# Patient Record
Sex: Male | Born: 1950 | Race: White | Hispanic: No | Marital: Married | Smoking: Current every day smoker
Health system: Southern US, Community
[De-identification: ages and names within clinical notes are randomized; demographics above are authoritative.]

## PROBLEM LIST (undated history)

## (undated) DIAGNOSIS — F1021 Alcohol dependence, in remission: Secondary | ICD-10-CM

## (undated) DIAGNOSIS — F419 Anxiety disorder, unspecified: Secondary | ICD-10-CM

## (undated) DIAGNOSIS — I10 Essential (primary) hypertension: Secondary | ICD-10-CM

## (undated) DIAGNOSIS — K9189 Other postprocedural complications and disorders of digestive system: Secondary | ICD-10-CM

## (undated) DIAGNOSIS — T7840XA Allergy, unspecified, initial encounter: Secondary | ICD-10-CM

## (undated) DIAGNOSIS — F32A Depression, unspecified: Secondary | ICD-10-CM

## (undated) DIAGNOSIS — K567 Ileus, unspecified: Secondary | ICD-10-CM

## (undated) DIAGNOSIS — E785 Hyperlipidemia, unspecified: Secondary | ICD-10-CM

## (undated) DIAGNOSIS — F329 Major depressive disorder, single episode, unspecified: Secondary | ICD-10-CM

## (undated) DIAGNOSIS — Z8619 Personal history of other infectious and parasitic diseases: Secondary | ICD-10-CM

## (undated) HISTORY — PX: CHOLECYSTECTOMY: SHX55

## (undated) HISTORY — PX: TONSILLECTOMY: SUR1361

## (undated) HISTORY — PX: OTHER SURGICAL HISTORY: SHX169

## (undated) HISTORY — DX: Essential (primary) hypertension: I10

## (undated) HISTORY — PX: COLON SURGERY: SHX602

## (undated) HISTORY — DX: Allergy, unspecified, initial encounter: T78.40XA

## (undated) HISTORY — DX: Hyperlipidemia, unspecified: E78.5

---

## 1998-12-31 ENCOUNTER — Encounter: Admission: RE | Admit: 1998-12-31 | Discharge: 1999-03-31 | Payer: Self-pay | Admitting: Family Medicine

## 1999-12-14 ENCOUNTER — Encounter: Admission: RE | Admit: 1999-12-14 | Discharge: 1999-12-14 | Payer: Self-pay | Admitting: Family Medicine

## 1999-12-14 ENCOUNTER — Encounter: Payer: Self-pay | Admitting: Family Medicine

## 2000-05-22 HISTORY — PX: CARDIAC CATHETERIZATION: SHX172

## 2001-02-07 ENCOUNTER — Other Ambulatory Visit: Admission: RE | Admit: 2001-02-07 | Discharge: 2001-02-07 | Payer: Self-pay | Admitting: Internal Medicine

## 2001-02-07 ENCOUNTER — Encounter (INDEPENDENT_AMBULATORY_CARE_PROVIDER_SITE_OTHER): Payer: Self-pay | Admitting: Specialist

## 2001-04-01 ENCOUNTER — Observation Stay (HOSPITAL_COMMUNITY): Admission: EM | Admit: 2001-04-01 | Discharge: 2001-04-02 | Payer: Self-pay

## 2004-07-04 ENCOUNTER — Ambulatory Visit: Payer: Self-pay | Admitting: Family Medicine

## 2004-07-11 ENCOUNTER — Ambulatory Visit: Payer: Self-pay | Admitting: Family Medicine

## 2005-02-22 ENCOUNTER — Ambulatory Visit: Payer: Self-pay | Admitting: Family Medicine

## 2005-03-24 ENCOUNTER — Ambulatory Visit: Payer: Self-pay | Admitting: Family Medicine

## 2005-03-24 ENCOUNTER — Encounter: Admission: RE | Admit: 2005-03-24 | Discharge: 2005-03-24 | Payer: Self-pay | Admitting: Family Medicine

## 2005-03-30 ENCOUNTER — Ambulatory Visit: Payer: Self-pay | Admitting: Cardiology

## 2005-04-18 ENCOUNTER — Ambulatory Visit: Payer: Self-pay | Admitting: Family Medicine

## 2005-04-19 ENCOUNTER — Ambulatory Visit (HOSPITAL_COMMUNITY): Admission: RE | Admit: 2005-04-19 | Discharge: 2005-04-20 | Payer: Self-pay | Admitting: General Surgery

## 2005-04-19 ENCOUNTER — Encounter (INDEPENDENT_AMBULATORY_CARE_PROVIDER_SITE_OTHER): Payer: Self-pay | Admitting: Specialist

## 2005-04-24 ENCOUNTER — Inpatient Hospital Stay (HOSPITAL_COMMUNITY): Admission: EM | Admit: 2005-04-24 | Discharge: 2005-04-26 | Payer: Self-pay | Admitting: Emergency Medicine

## 2005-07-14 ENCOUNTER — Ambulatory Visit: Payer: Self-pay | Admitting: Family Medicine

## 2005-08-17 ENCOUNTER — Ambulatory Visit: Payer: Self-pay | Admitting: Family Medicine

## 2005-10-10 ENCOUNTER — Ambulatory Visit: Payer: Self-pay | Admitting: Family Medicine

## 2006-01-16 ENCOUNTER — Ambulatory Visit: Payer: Self-pay | Admitting: Family Medicine

## 2006-01-16 ENCOUNTER — Emergency Department (HOSPITAL_COMMUNITY): Admission: EM | Admit: 2006-01-16 | Discharge: 2006-01-16 | Payer: Self-pay | Admitting: Emergency Medicine

## 2006-01-29 ENCOUNTER — Ambulatory Visit: Payer: Self-pay | Admitting: Family Medicine

## 2006-10-17 ENCOUNTER — Ambulatory Visit: Payer: Self-pay | Admitting: Internal Medicine

## 2006-11-02 ENCOUNTER — Ambulatory Visit: Payer: Self-pay | Admitting: Internal Medicine

## 2006-11-02 ENCOUNTER — Encounter: Payer: Self-pay | Admitting: Internal Medicine

## 2007-02-13 DIAGNOSIS — F528 Other sexual dysfunction not due to a substance or known physiological condition: Secondary | ICD-10-CM | POA: Insufficient documentation

## 2007-02-13 DIAGNOSIS — J309 Allergic rhinitis, unspecified: Secondary | ICD-10-CM | POA: Insufficient documentation

## 2007-02-13 DIAGNOSIS — I10 Essential (primary) hypertension: Secondary | ICD-10-CM

## 2007-02-13 DIAGNOSIS — E785 Hyperlipidemia, unspecified: Secondary | ICD-10-CM

## 2007-04-03 ENCOUNTER — Encounter: Payer: Self-pay | Admitting: Family Medicine

## 2007-04-12 ENCOUNTER — Ambulatory Visit: Payer: Self-pay | Admitting: Family Medicine

## 2007-04-12 LAB — CONVERTED CEMR LAB
AST: 56 units/L — ABNORMAL HIGH (ref 0–37)
Bilirubin, Direct: 0.2 mg/dL (ref 0.0–0.3)
Blood in Urine, dipstick: NEGATIVE
Chloride: 100 meq/L (ref 96–112)
Creatinine, Ser: 0.9 mg/dL (ref 0.4–1.5)
Creatinine,U: 244.3 mg/dL
Eosinophils Relative: 2.3 % (ref 0.0–5.0)
Glucose, Bld: 269 mg/dL — ABNORMAL HIGH (ref 70–99)
HCT: 43.5 % (ref 39.0–52.0)
LDL Cholesterol: 78 mg/dL (ref 0–99)
MCV: 92.3 fL (ref 78.0–100.0)
Neutrophils Relative %: 49.5 % (ref 43.0–77.0)
RBC: 4.71 M/uL (ref 4.22–5.81)
RDW: 12.7 % (ref 11.5–14.6)
Sodium: 138 meq/L (ref 135–145)
Total Bilirubin: 1.2 mg/dL (ref 0.3–1.2)
Total CHOL/HDL Ratio: 3.5
WBC Urine, dipstick: NEGATIVE
WBC: 6.9 10*3/uL (ref 4.5–10.5)
pH: 5.5

## 2007-05-03 ENCOUNTER — Ambulatory Visit: Payer: Self-pay | Admitting: Family Medicine

## 2007-05-04 DIAGNOSIS — M5137 Other intervertebral disc degeneration, lumbosacral region: Secondary | ICD-10-CM | POA: Insufficient documentation

## 2007-05-10 ENCOUNTER — Ambulatory Visit: Payer: Self-pay | Admitting: Family Medicine

## 2007-06-17 ENCOUNTER — Ambulatory Visit: Payer: Self-pay | Admitting: Family Medicine

## 2007-08-13 ENCOUNTER — Ambulatory Visit: Payer: Self-pay | Admitting: Family Medicine

## 2007-08-17 ENCOUNTER — Encounter: Admission: RE | Admit: 2007-08-17 | Discharge: 2007-08-17 | Payer: Self-pay | Admitting: Family Medicine

## 2007-08-22 ENCOUNTER — Telehealth: Payer: Self-pay | Admitting: Family Medicine

## 2007-08-26 ENCOUNTER — Encounter: Admission: RE | Admit: 2007-08-26 | Discharge: 2007-08-26 | Payer: Self-pay | Admitting: Family Medicine

## 2007-09-09 ENCOUNTER — Encounter: Admission: RE | Admit: 2007-09-09 | Discharge: 2007-09-09 | Payer: Self-pay | Admitting: Family Medicine

## 2007-10-03 ENCOUNTER — Encounter: Admission: RE | Admit: 2007-10-03 | Discharge: 2007-10-03 | Payer: Self-pay | Admitting: Family Medicine

## 2007-10-21 ENCOUNTER — Ambulatory Visit: Payer: Self-pay | Admitting: Family Medicine

## 2007-10-21 DIAGNOSIS — L02619 Cutaneous abscess of unspecified foot: Secondary | ICD-10-CM | POA: Insufficient documentation

## 2007-10-21 DIAGNOSIS — L03119 Cellulitis of unspecified part of limb: Secondary | ICD-10-CM

## 2007-10-22 ENCOUNTER — Telehealth (INDEPENDENT_AMBULATORY_CARE_PROVIDER_SITE_OTHER): Payer: Self-pay | Admitting: *Deleted

## 2007-10-24 ENCOUNTER — Telehealth (INDEPENDENT_AMBULATORY_CARE_PROVIDER_SITE_OTHER): Payer: Self-pay | Admitting: *Deleted

## 2007-11-26 ENCOUNTER — Encounter: Admission: RE | Admit: 2007-11-26 | Discharge: 2008-01-02 | Payer: Self-pay | Admitting: Neurosurgery

## 2008-04-20 ENCOUNTER — Telehealth: Payer: Self-pay | Admitting: Family Medicine

## 2008-07-16 ENCOUNTER — Telehealth: Payer: Self-pay | Admitting: Family Medicine

## 2008-07-28 ENCOUNTER — Telehealth: Payer: Self-pay | Admitting: Family Medicine

## 2008-08-18 ENCOUNTER — Telehealth: Payer: Self-pay | Admitting: Family Medicine

## 2008-12-25 ENCOUNTER — Ambulatory Visit (HOSPITAL_COMMUNITY): Admission: RE | Admit: 2008-12-25 | Discharge: 2008-12-25 | Payer: Self-pay | Admitting: Podiatry

## 2009-04-27 ENCOUNTER — Telehealth: Payer: Self-pay | Admitting: Family Medicine

## 2009-05-31 ENCOUNTER — Ambulatory Visit: Payer: Self-pay | Admitting: Family Medicine

## 2009-06-03 LAB — CONVERTED CEMR LAB
ALT: 41 units/L (ref 0–53)
Albumin: 3.9 g/dL (ref 3.5–5.2)
BUN: 15 mg/dL (ref 6–23)
Basophils Absolute: 0 10*3/uL (ref 0.0–0.1)
Bilirubin, Direct: 0.3 mg/dL (ref 0.0–0.3)
Calcium: 9.6 mg/dL (ref 8.4–10.5)
Chloride: 104 meq/L (ref 96–112)
Cholesterol: 124 mg/dL (ref 0–200)
Creatinine, Ser: 0.8 mg/dL (ref 0.4–1.5)
Creatinine,U: 158 mg/dL
Eosinophils Absolute: 0.1 10*3/uL (ref 0.0–0.7)
Eosinophils Relative: 2.2 % (ref 0.0–5.0)
Hgb A1c MFr Bld: 10.1 % — ABNORMAL HIGH (ref 4.6–6.5)
LDL Cholesterol: 62 mg/dL (ref 0–99)
Lymphs Abs: 3 10*3/uL (ref 0.7–4.0)
MCV: 95 fL (ref 78.0–100.0)
Microalb Creat Ratio: 42.4 mg/g — ABNORMAL HIGH (ref 0.0–30.0)
Microalb, Ur: 6.7 mg/dL — ABNORMAL HIGH (ref 0.0–1.9)
Monocytes Absolute: 0.5 10*3/uL (ref 0.1–1.0)
Neutro Abs: 2.6 10*3/uL (ref 1.4–7.7)
Neutrophils Relative %: 41.9 % — ABNORMAL LOW (ref 43.0–77.0)
PSA: 0.17 ng/mL (ref 0.10–4.00)
Platelets: 172 10*3/uL (ref 150.0–400.0)
RBC: 4.9 M/uL (ref 4.22–5.81)
RDW: 12 % (ref 11.5–14.6)
Total Bilirubin: 1.6 mg/dL — ABNORMAL HIGH (ref 0.3–1.2)
Triglycerides: 110 mg/dL (ref 0.0–149.0)
VLDL: 22 mg/dL (ref 0.0–40.0)
WBC: 6.2 10*3/uL (ref 4.5–10.5)

## 2009-06-14 ENCOUNTER — Ambulatory Visit: Payer: Self-pay | Admitting: Family Medicine

## 2009-07-01 ENCOUNTER — Ambulatory Visit: Payer: Self-pay | Admitting: Family Medicine

## 2009-07-05 ENCOUNTER — Ambulatory Visit: Payer: Self-pay | Admitting: Family Medicine

## 2009-07-23 ENCOUNTER — Ambulatory Visit: Payer: Self-pay | Admitting: Family Medicine

## 2009-08-06 ENCOUNTER — Ambulatory Visit: Payer: Self-pay | Admitting: Family Medicine

## 2009-09-06 ENCOUNTER — Ambulatory Visit: Payer: Self-pay | Admitting: Family Medicine

## 2009-09-06 LAB — CONVERTED CEMR LAB
BUN: 8 mg/dL (ref 6–23)
Calcium: 9.7 mg/dL (ref 8.4–10.5)
Creatinine, Ser: 0.7 mg/dL (ref 0.4–1.5)
GFR calc non Af Amer: 122.34 mL/min (ref >60)
Hgb A1c MFr Bld: 6.4 % (ref 4.6–6.5)

## 2009-09-14 ENCOUNTER — Ambulatory Visit: Payer: Self-pay | Admitting: Family Medicine

## 2009-10-21 ENCOUNTER — Ambulatory Visit: Payer: Self-pay | Admitting: Family Medicine

## 2009-11-01 ENCOUNTER — Encounter: Admission: RE | Admit: 2009-11-01 | Discharge: 2010-01-30 | Payer: Self-pay | Admitting: Family Medicine

## 2009-11-11 ENCOUNTER — Ambulatory Visit: Payer: Self-pay | Admitting: Family Medicine

## 2009-12-07 ENCOUNTER — Ambulatory Visit: Payer: Self-pay | Admitting: Family Medicine

## 2009-12-07 DIAGNOSIS — B029 Zoster without complications: Secondary | ICD-10-CM | POA: Insufficient documentation

## 2009-12-17 ENCOUNTER — Telehealth: Payer: Self-pay | Admitting: Internal Medicine

## 2010-01-04 ENCOUNTER — Ambulatory Visit: Payer: Self-pay | Admitting: Family Medicine

## 2010-01-04 DIAGNOSIS — B0229 Other postherpetic nervous system involvement: Secondary | ICD-10-CM

## 2010-01-07 ENCOUNTER — Ambulatory Visit: Payer: Self-pay | Admitting: Family Medicine

## 2010-02-24 ENCOUNTER — Telehealth: Payer: Self-pay | Admitting: Family Medicine

## 2010-04-29 ENCOUNTER — Ambulatory Visit: Payer: Self-pay | Admitting: Family Medicine

## 2010-04-29 DIAGNOSIS — E1149 Type 2 diabetes mellitus with other diabetic neurological complication: Secondary | ICD-10-CM | POA: Insufficient documentation

## 2010-04-29 LAB — CONVERTED CEMR LAB
AST: 28 units/L (ref 0–37)
Albumin: 4.1 g/dL (ref 3.5–5.2)
Alkaline Phosphatase: 88 units/L (ref 39–117)
Basophils Absolute: 0.1 10*3/uL (ref 0.0–0.1)
Basophils Relative: 0.7 % (ref 0.0–3.0)
CO2: 28 meq/L (ref 19–32)
Creatinine,U: 62.2 mg/dL
Glucose, Bld: 118 mg/dL — ABNORMAL HIGH (ref 70–99)
HCT: 43.6 % (ref 39.0–52.0)
Hemoglobin: 15.1 g/dL (ref 13.0–17.0)
Lymphs Abs: 3.3 10*3/uL (ref 0.7–4.0)
MCHC: 34.7 g/dL (ref 30.0–36.0)
Monocytes Relative: 7.5 % (ref 3.0–12.0)
Neutro Abs: 3.9 10*3/uL (ref 1.4–7.7)
Potassium: 4.5 meq/L (ref 3.5–5.1)
RBC: 4.75 M/uL (ref 4.22–5.81)
RDW: 14.1 % (ref 11.5–14.6)
Sodium: 138 meq/L (ref 135–145)
TSH: 1.57 microintl units/mL (ref 0.35–5.50)
Total CHOL/HDL Ratio: 3
Total Protein: 7 g/dL (ref 6.0–8.3)

## 2010-06-08 ENCOUNTER — Telehealth: Payer: Self-pay | Admitting: Family Medicine

## 2010-06-21 ENCOUNTER — Other Ambulatory Visit (HOSPITAL_COMMUNITY): Payer: Self-pay | Admitting: Neurology

## 2010-06-21 DIAGNOSIS — IMO0002 Reserved for concepts with insufficient information to code with codable children: Secondary | ICD-10-CM

## 2010-06-23 ENCOUNTER — Other Ambulatory Visit: Payer: Self-pay | Admitting: Family Medicine

## 2010-06-23 DIAGNOSIS — N63 Unspecified lump in unspecified breast: Secondary | ICD-10-CM

## 2010-06-23 NOTE — Assessment & Plan Note (Signed)
Summary: 3 month follow up/cjr   Vital Signs:  Patient profile:   60 year [name] Temp:     98.5 degrees F oral BP sitting:   120 / 84  (left arm) Cuff size:   regular  Vitals Entered By: Jonathan Calhoun CMA Jonathan Calhoun) (September 14, 2009 9:14 AM)  CC: follow-up visit   CC:  follow-up visit.  History of Present Illness: Jonathan Calhoun, Jonathan Calhoun, Jonathan Calhoun, Jonathan Calhoun.  He is going to AA daily.  He's lost weight.  His blood sugar has stabilized in the 80s.  Also his blood pressure continues to be low despite cutting back his medication.  He found a third of a tablet of Hyzaar, and each having episodes of lightheadedness in the morning.  Systolic ranges from 10 5 to 124.  hemoglobin A1C less than 6.5.  Continue current diabetic treatment program  Allergies: No Known Drug Allergies  Past History:  Past medical, surgical, family and social histories (including risk factors) reviewed for relevance to current acute and chronic problems.  Past Medical History: Reviewed history from 02/13/2007 and no changes required. Allergic rhinitis Diabetes mellitus, type II Hyperlipidemia Calhoun erectile dysfunction  Past Surgical History: Reviewed history from 02/13/2007 and no changes required. cardiac cath  Oct 04, 2000 Cholecystectomy  10-04-2004  Family History: Reviewed history from 08/13/2007 and no changes required. father of 95, coronary disease  and outer diseasemother history of depression, colon cancer, died in 10-04-00 one brother in good health  Social History: Reviewed history from 05/03/2007 and no changes required. Occupation: [employer] Never Smoked Alcohol use-yes Drug use-no Regular exercise-yes  Review of Systems      See HPI  Physical Exam  General:  Well-developed,well-nourished,in no acute distress; alert,appropriate and cooperative throughout examination  Diabetes Management Exam:    Eye Exam:       Eye Exam done  elsewhere          Date: 07/20/2009          Results: normal          Done by: bevis   Impression & Recommendations:  Problem # 1:  Calhoun (ICD-401.9) Assessment Improved  The following medications were removed from the medication list:    Clonidine Hcl 0.1 Mg Tabs (Clonidine hcl) .[name] Kitchen... Take 1 tablet by mouth once a day    Atenolol 25 Mg Tabs (Atenolol) .[name] Kitchen... Take 1 tablet by mouth once a day His updated medication list for this problem includes:    Hyzaar 100-25 Mg Tabs (Losartan potassium-hctz) .[name] Kitchen... Take 1 tablet by mouth once a day  Complete Medication List: 1)  Hyzaar 100-25 Mg Tabs (Losartan potassium-hctz) .... Take 1 tablet by mouth once a day 2)  Lipitor 40 Mg Tabs (Atorvastatin calcium) .... Take 1 tablet by mouth at bedtime 3)  Aspirin 81 Mg Tbec (aspirin)  .... Once daily 4)  Viagra 50 Mg Tabs (Sildenafil citrate) .... As needed 5)  Onetouch Ultra Test Strp (Glucose blood) .... Use once daily to test glucose levels  dx 250.00 6)  Onetouch Delica Lancets Misc (Lancets) .... Use once daily to test glucose levels dx 250.00 7)  Metformin Hcl 500 Mg Tabs (Metformin hcl) .... Take one tab two times a day 8)  Glipizide 5 Mg Tabs (Glipizide) .... Take one tab by mouth two times a day  Patient Instructions: 1)  stop  the Hyzaar, completely.  Check a morning blood pressure daily.  Fax me the data in  4 weeks at [id number]. 2)  Schedule a CPX.  The third week in July 3)  BMP prior to visit, ICD-9:.........250.00 4)  Hepatic Panel prior to visit, ICD-9: 5)  Lipid Panel prior to visit, ICD-9: 6)  TSH prior to visit, ICD-9: 7)  CBC w/ Diff prior to visit, ICD-9: 8)  Urine-dip prior to visit, ICD-9: 9)  PSA prior to visit, ICD-9: 10)  HbgA1C prior to visit, ICD-9: 11)  Urine Microalbumin prior to visit, ICD-9:

## 2010-06-23 NOTE — Assessment & Plan Note (Signed)
Summary: ? SHINGLES//CCM   Vital Signs:  Patient profile:   60 year old male BP sitting:   130 / 84  (left arm) Cuff size:   regular CC: shingles   CC:  shingles.  History of Present Illness: Jonathan Calhoun is a 60 year old male, who comes in today for evaluation of probable shingles on his left chest.  He states for about two weeks.  He said some tingling and pain in that area.  Then, 3 days ago, he broke out in a rash.  He said shingles in the past for 5 years ago  Allergies: No Known Drug Allergies  Past History:  Past medical, surgical, family and social histories (including risk factors) reviewed for relevance to current acute and chronic problems.  Past Medical History: Reviewed history from 02/13/2007 and no changes required. Allergic rhinitis Diabetes mellitus, type II Hyperlipidemia Hypertension erectile dysfunction  Past Surgical History: Reviewed history from 02/13/2007 and no changes required. cardiac cath  2000/10/03 Cholecystectomy  10/03/04  Family History: Reviewed history from 08/13/2007 and no changes required. father of 14, coronary disease  and outer diseasemother history of depression, colon cancer, died in 10/03/00 one brother in good health  Social History: Reviewed history from 05/03/2007 and no changes required. Occupation: Married Never Smoked Alcohol use-yes Drug use-no Regular exercise-yes  Physical Exam  General:  Well-developed,well-nourished,in no acute distress; alert,appropriate and cooperative throughout examination Skin:   rash, consistent with shingles   Problems:  Medical Problems Added: 1)  Dx of Herpes Zoster  (ICD-053.9)  Impression & Recommendations:  Problem # 1:  HERPES ZOSTER (ICD-053.9) Assessment New  Complete Medication List: 1)  Hyzaar 100-25 Mg Tabs (Losartan potassium-hctz) .... Take 1 tablet by mouth once a day 2)  Lipitor 40 Mg Tabs (Atorvastatin calcium) .... Take 1 tablet by mouth at bedtime 3)  Aspirin 81 Mg Tbec  (aspirin)  .... Once daily 4)  Viagra 50 Mg Tabs (Sildenafil citrate) .... As needed 5)  Onetouch Ultra Test Strp (Glucose blood) .... Use once daily to test glucose levels  dx 250.00 6)  Onetouch Delica Lancets Misc (Lancets) .... Use once daily to test glucose levels dx 250.00 7)  Metformin Hcl 500 Mg Tabs (Metformin hcl) .... Take one tab two times a day 8)  Glipizide 5 Mg Tabs (Glipizide) .... Take one tab by mouth two times a day 9)  Vicodin Es 7.5-750 Mg Tabs (Hydrocodone-acetaminophen) .... 1/2 to 1 three times a day as needed 10)  Flexeril 10 Mg Tabs (Cyclobenzaprine hcl) .... Take 1 tablet by mouth three times a day 11)  Acyclovir 800 Mg Tabs (Acyclovir) .... Take 1 tablet by mouth three times a day  Patient Instructions: 1)  Zovirax 800 mg 3 times a day until rash clears Prescriptions: ACYCLOVIR 800 MG TABS (ACYCLOVIR) Take 1 tablet by mouth three times a day  #30 x 1   Entered and Authorized by:   Roderick Pee MD   Signed by:   Roderick Pee MD on 12/07/2009   Method used:   Print then Give to Patient   RxID:   [id number]

## 2010-06-23 NOTE — Assessment & Plan Note (Signed)
Summary: fup//ccm   Vital Signs:  Patient profile:   60 year old male BP sitting:   110 / 80  (left arm) Cuff size:   regular  Vitals Entered By: Kern Reap CMA Duncan Dull) (January 07, 2010 9:19 AM)  CC: follow-up visit   CC:  follow-up visit.  History of Present Illness: Jonathan Calhoun is 60-year-old male, who comes in today for follow-up of post shingles neuralgia.  He was taking gabapentin 200 mg t.i.d. with no improvement in his pain in fact, it got worse.  I saw him on August the 16th and started him on oxycodone 5 mg.  T.i.d.  I also decreased his gabapentin pain to 100 mg 3 times a day because he was having side effects from the gabapentin   He states his pain is gone from 10 down to a 5.  He states he is been able to sleep for the first time in 3 weeks.  He is still having pain but he feels much improved since he is able to sleep and has gotten some relief.  He does not want to take any more than 3 tablets a day.  He feels, like that's holding the pain at bay.  We also discussed consultation with Dr. Rosanne Ashing love.  I will call Harriett Sine and try to get him in to see jim  next week  Allergies: No Known Drug Allergies  Past History:  Past medical, surgical, family and social histories (including risk factors) reviewed, and no changes noted (except as noted below).  Past Medical History: Reviewed history from 02/13/2007 and no changes required. Allergic rhinitis Diabetes mellitus, type II Hyperlipidemia Hypertension erectile dysfunction  Past Surgical History: Reviewed history from 02/13/2007 and no changes required. cardiac cath  10-22-2000 Cholecystectomy  10-22-04  Family History: Reviewed history from 08/13/2007 and no changes required. father of 59, coronary disease  and outer diseasemother history of depression, colon cancer, died in Oct 22, 2000 one brother in good health  Social History: Reviewed history from 05/03/2007 and no changes required. Occupation: Married Never Smoked Alcohol  use-yes Drug use-no Regular exercise-yes  Review of Systems      See HPI  Physical Exam  General:  Well-developed,well-nourished,in no acute distress; alert,appropriate and cooperative throughout examination   Impression & Recommendations:  Problem # 1:  POSTHERPETIC NEURALGIA (ICD-053.19) Assessment Improved  Complete Medication List: 1)  Hyzaar 100-25 Mg Tabs (Losartan potassium-hctz) .... Take 1 tablet by mouth once a day 2)  Lipitor 40 Mg Tabs (Atorvastatin calcium) .... Take 1 tablet by mouth at bedtime 3)  Aspirin 81 Mg Tbec (aspirin)  .... Once daily 4)  Viagra 50 Mg Tabs (Sildenafil citrate) .... As needed 5)  Onetouch Ultra Test Strp (Glucose blood) .... Use once daily to test glucose levels  dx 250.00 6)  Onetouch Delica Lancets Misc (Lancets) .... Use once daily to test glucose levels dx 250.00 7)  Metformin Hcl 500 Mg Tabs (Metformin hcl) .... Take one tab two times a day 8)  Glipizide 5 Mg Tabs (Glipizide) .... Take one tab by mouth two times a day 9)  Vicodin Es 7.5-750 Mg Tabs (Hydrocodone-acetaminophen) .... 1/2 to 1 three times a day as needed 10)  Flexeril 10 Mg Tabs (Cyclobenzaprine hcl) .... Take 1 tablet by mouth three times a day 11)  Acyclovir 800 Mg Tabs (Acyclovir) .... Take 1 tablet by mouth three times a day 12)  Gabapentin 100 Mg Caps (Gabapentin) .[name] Kitchen.. 100 mg three times a day x one week, then 2  tid 13)  Oxycodone Hcl 5 Mg Tabs (Oxycodone hcl) .... Take 1 tablet by mouth three times a day  Patient Instructions: 1)  continue your current medications.  We will call to get her set up to see Dr. Rosanne Ashing love next week for consultation

## 2010-06-23 NOTE — Assessment & Plan Note (Signed)
Summary: fu on shingles/njr   Vital Signs:  Patient profile:   60 year old male BP sitting:   102 / 78  (left arm)  Vitals Entered By: Kern Reap CMA Duncan Dull) (January 04, 2010 1:59 PM) CC: follow-up visit   CC:  follow-up visit.  History of Present Illness: Jonathan Calhoun is a 60 year old male, who comes in today for treatment of post-shingles neuralgia.  He was seen a couple weeks ago with shingles and given Zovirax and Vicodin.  However, the rash went away, but the pains got worse.  He came back in my absence, and was started on gabapentin.  He is now taking 200 mg 3 times a day and is not helping.  He states his pain at night can be as high as a 15.  The Vicodin is not helping.  Dr. love sees [name] for trigeminal neuralgia and that we will try to get him a consult with Dr. love  Allergies: No Known Drug Allergies  Social History: Reviewed history from 05/03/2007 and no changes required. Occupation: Married Never Smoked Alcohol use-yes Drug use-no Regular exercise-yes  Review of Systems      See HPI  Physical Exam  General:  Well-developed,well-nourished,in no acute distress; alert,appropriate and cooperative throughout examination Skin:  Intact without suspicious lesions or rashes   Impression & Recommendations:  Problem # 1:  POSTHERPETIC NEURALGIA (ICD-053.19) Assessment New  Complete Medication List: 1)  Hyzaar 100-25 Mg Tabs (Losartan potassium-hctz) .... Take 1 tablet by mouth once a day 2)  Lipitor 40 Mg Tabs (Atorvastatin calcium) .... Take 1 tablet by mouth at bedtime 3)  Aspirin 81 Mg Tbec (aspirin)  .... Once daily 4)  Viagra 50 Mg Tabs (Sildenafil citrate) .... As needed 5)  Onetouch Ultra Test Strp (Glucose blood) .... Use once daily to test glucose levels  dx 250.00 6)  Onetouch Delica Lancets Misc (Lancets) .... Use once daily to test glucose levels dx 250.00 7)  Metformin Hcl 500 Mg Tabs (Metformin hcl) .... Take one tab two times a day 8)   Glipizide 5 Mg Tabs (Glipizide) .... Take one tab by mouth two times a day 9)  Vicodin Es 7.5-750 Mg Tabs (Hydrocodone-acetaminophen) .... 1/2 to 1 three times a day as needed 10)  Flexeril 10 Mg Tabs (Cyclobenzaprine hcl) .... Take 1 tablet by mouth three times a day 11)  Acyclovir 800 Mg Tabs (Acyclovir) .... Take 1 tablet by mouth three times a day 12)  Gabapentin 100 Mg Caps (Gabapentin) .[name] Kitchen.. 100 mg three times a day x one week, then 2 tid 13)  Oxycodone Hcl 5 Mg Tabs (Oxycodone hcl) .... Take 1 tablet by mouth three times a day  Patient Instructions: 1)  stop the acyclovir, and decreased the gabapentin wean to one tablet 3 times a day. 2)  Take oxycodone 5 mg 3 times a day. 3)  We will also put in a request for a consult with Dr. love. 4)  See me this Friday for follow-up Prescriptions: OXYCODONE HCL 5 MG TABS (OXYCODONE HCL) Take 1 tablet by mouth three times a day  #100 x 0   Entered and Authorized by:   Roderick Pee MD   Signed by:   Roderick Pee MD on 01/04/2010   Method used:   Print then Give to Patient   RxID:   [id number]

## 2010-06-23 NOTE — Assessment & Plan Note (Signed)
Summary: 2 wk rov/njr pt rsc/njr   Vital Signs:  Patient profile:   60 year old male BP sitting:   120 / 84  (left arm) Cuff size:   regular CC: follow-up visit   CC:  follow-up visit.  History of Present Illness: Jonathan Calhoun is a 60 year old male, nonsmoker, who comes in today for reevaluation of low back pain.  He is gradually getting better with time medication and most notably, physical therapy.  He states his pain is down to 4.  Again, no neurologic symptoms  Allergies: No Known Drug Allergies  Past History:  Past medical, surgical, family and social histories (including risk factors) reviewed for relevance to current acute and chronic problems.  Past Medical History: Reviewed history from 02/13/2007 and no changes required. Allergic rhinitis Diabetes mellitus, type II Hyperlipidemia Hypertension erectile dysfunction  Past Surgical History: Reviewed history from 02/13/2007 and no changes required. cardiac cath  Oct 09, 2000 Cholecystectomy  09-Oct-2004  Family History: Reviewed history from 08/13/2007 and no changes required. father of 68, coronary disease  and outer diseasemother history of depression, colon cancer, died in 10/09/00 one brother in good health  Social History: Reviewed history from 05/03/2007 and no changes required. Occupation: Married Never Smoked Alcohol use-yes Drug use-no Regular exercise-yes  Review of Systems      See HPI  Physical Exam  General:  Well-developed,well-nourished,in no acute distress; alert,appropriate and cooperative throughout examination Msk:  No deformity or scoliosis noted of thoracic or lumbar spine.   Pulses:  R and L carotid,radial,femoral,dorsalis pedis and posterior tibial pulses are full and equal bilaterally Extremities:  No clubbing, cyanosis, edema, or deformity noted with normal full range of motion of all joints.   Neurologic:  No cranial nerve deficits noted. Station and gait are normal. Plantar reflexes are down-going  bilaterally. DTRs are symmetrical throughout. Sensory, motor and coordinative functions appear intact.   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 Take 1 tablet by mouth once a day 2)  Lipitor 40 Mg Tabs (Atorvastatin calcium) .... Take 1 tablet by mouth at bedtime 3)  Aspirin 81 Mg Tbec (aspirin)  .... Once daily 4)  Viagra 50 Mg Tabs (Sildenafil citrate) .... As needed 5)  Onetouch Ultra Test Strp (Glucose blood) .... Use once daily to test glucose levels  dx 250.00 6)  Onetouch Delica Lancets Misc (Lancets) .... Use once daily to test glucose levels dx 250.00 7)  Metformin Hcl 500 Mg Tabs (Metformin hcl) .... Take one tab two times a day 8)  Glipizide 5 Mg Tabs (Glipizide) .... Take one tab by mouth two times a day 9)  Vicodin Es 7.5-750 Mg Tabs (Hydrocodone-acetaminophen) .... 1/2 to 1 three times a day as needed 10)  Flexeril 10 Mg Tabs (Cyclobenzaprine hcl) .... Take 1 tablet by mouth three times a day  Patient Instructions: 1)  continue your current treatment program.  Return p.r.n.

## 2010-06-23 NOTE — Assessment & Plan Note (Signed)
Summary: [facility] MED[facility]//CCM   Vital Signs:  Patient profile:   60 year old male Height:      73 inches Weight:      266 pounds BMI:     35.22 Temp:     97.8 degrees F oral BP sitting:   172 / 110  (left arm) Cuff size:   regular  Vitals Entered By: Kern Reap CMA Duncan Dull) (May 31, 2009 3:08 PM)  Reason for Visit med check  History of Present Illness: Jonathan Calhoun is a 60 year old, married male, nonsmoker now non-drinker x 6 weeks plus......... he had a friend, who was an alcoholic, who required hospitalization for his alcoholism.  After observing his friend he decided to  stop drinking completely drinking has never been a problem for and he  worked every, day.  He's not had a DUI, but he decided just to stop drinking.  He is going to [employer] and doing well.  Her weight is unchanged.  BP 172/110, however, he's been out of his Hyzaar for 4 days, plus.  blood sugar is running 140 fasting on Glucophage 500 b.i.d. and glipizide 10 mg b.i.d.  Would increase Glucophage to 1000 mg prior to breakfast.  Continue 500 mg prior to p.m. meal CPX ASAP since due.    Allergies: No Known Drug Allergies  Past History:  Past medical, surgical, family and social histories (including risk factors) reviewed for relevance to current acute and chronic problems.  Past Medical History: Reviewed history from 02/13/2007 and no changes required. Allergic rhinitis Diabetes mellitus, type II Hyperlipidemia Hypertension erectile dysfunction  Past Surgical History: Reviewed history from 02/13/2007 and no changes required. cardiac cath  10-14-00 Cholecystectomy  10/14/04  Family History: Reviewed history from 08/13/2007 and no changes required. father of 63, coronary disease  and outer diseasemother history of depression, colon cancer, died in 2000-10-14 one brother in good health  Social History: Reviewed history from 05/03/2007 and no changes required. Occupation: Married Never Smoked Alcohol use-yes Drug  use-no Regular exercise-yes  Review of Systems      See HPI  Physical Exam  General:  Well-developed,well-nourished,in no acute distress; alert,appropriate and cooperative throughout examination   Impression & Recommendations:  Problem # 1:  HYPERTENSION (ICD-401.9) Assessment Deteriorated  His updated medication list for this problem includes:    Hyzaar 100-25 Mg Tabs (Losartan potassium-hctz) .[name] Kitchen... Take 1 tablet by mouth once a day    Clonidine Hcl 0.1 Mg Tabs (Clonidine hcl) .[name] Kitchen... Take 1 tablet by mouth once a day    Atenolol 25 Mg Tabs (Atenolol) .[name] Kitchen... Take 1 tablet by mouth once a day  Orders: Prescription Created Electronically [phone])  Problem # 2:  DIABETES MELLITUS, TYPE II (ICD-250.00) Assessment: Deteriorated  The following medications were removed from the medication list:    Glucotrol 10 Mg Tabs (Glipizide) .[name] Kitchen... Take 1 tablet by mouth two times a day His updated medication list for this problem includes:    Hyzaar 100-25 Mg Tabs (Losartan potassium-hctz) .[name] Kitchen... Take 1 tablet by mouth once a day    Glipizide 10 Mg Tb24 (Glipizide) .[name] Kitchen... Take 1 tablet by mouth two times a day    Glucophage 500 Mg Tabs (Metformin hcl) .[name] Kitchen... 1 < pm meal    Glucophage 1000 Mg Tabs (Metformin hcl) .[name] Kitchen... 1 < am meal  Complete Medication List: 1)  Hyzaar 100-25 Mg Tabs (Losartan potassium-hctz) .... Take 1 tablet by mouth once a day 2)  Clonidine Hcl 0.1 Mg Tabs (Clonidine hcl) .... Take 1 tablet  by mouth once a day 3)  Glipizide 10 Mg Tb24 (Glipizide) .... Take 1 tablet by mouth two times a day 4)  Lipitor 40 Mg Tabs (Atorvastatin calcium) .... Take 1 tablet by mouth at bedtime 5)  Aspirin 81 Mg Tbec (aspirin)  .... Once daily 6)  Atenolol 25 Mg Tabs (Atenolol) .... Take 1 tablet by mouth once a day 7)  Viagra 50 Mg Tabs (Sildenafil citrate) .... As needed 8)  Glucophage 500 Mg Tabs (Metformin hcl) .[name] Kitchen.. 1 < pm meal 9)  Glucophage 1000 Mg Tabs (Metformin hcl) .[name] Kitchen.. 1 < am  meal  Patient Instructions: 1)  Please schedule a follow-up appointment in 2 weeks. 2)  It is important that you exercise regularly at least 20 minutes 5 times a week. If you develop chest pain, have severe difficulty breathing, or feel very tired , stop exercising immediately and seek medical attention. 3)  You need to lose weight. Consider a lower calorie diet and regular exercise.  4)  Take an Aspirin every day. 5)  Check your blood sugars regularly. If your readings are usually above : or below 70 you should contact our office. 6)  It is important that your Diabetic A1c level is checked every 3 months. 7)  See your eye doctor yearly to check for diabetic eye damage. 8)  Check your feet each night for sore areas, calluses or signs of infection. 9)  Check your Blood Pressure regularly. If it is above: you should make an appointment. 10)  BMP prior to visit, ICD-9:..................................250.00,401.9 11)  Hepatic Panel prior to visit, ICD-9: 12)  Lipid Panel prior to visit, ICD-9: 13)  TSH prior to visit, ICD-9: 14)  CBC w/ Diff prior to visit, ICD-9: 15)  Urine-dip prior to visit, ICD-9: 16)  PSA prior to visit, ICD-9: 17)  HbgA1C prior to visit, ICD-9: 18)  Urine Microalbumin prior to visit, ICD-9: Prescriptions: GLUCOPHAGE 1000 MG TABS (METFORMIN HCL) 1 < am meal  #100 x 3   Entered and Authorized by:   Roderick Pee MD   Signed by:   Roderick Pee MD on 05/31/2009   Method used:   Print then Give to Patient   RxID:   [id number] GLUCOPHAGE 500 MG  TABS (METFORMIN HCL) 1 < pm meal  #100 x 3   Entered and Authorized by:   Roderick Pee MD   Signed by:   Roderick Pee MD on 05/31/2009   Method used:   Print then Give to Patient   RxID:   [id number] VIAGRA 50 MG  TABS (SILDENAFIL CITRATE) as needed  #6 x 11   Entered and Authorized by:   Roderick Pee MD   Signed by:   Roderick Pee MD on 05/31/2009   Method used:   Print then Give to Patient   RxID:    [id number] ATENOLOL 25 MG  TABS (ATENOLOL) Take 1 tablet by mouth once a day  #100 x 3   Entered and Authorized by:   Roderick Pee MD   Signed by:   Roderick Pee MD on 05/31/2009   Method used:   Print then Give to Patient   RxID:   [id number] LIPITOR 40 MG  TABS (ATORVASTATIN CALCIUM) Take 1 tablet by mouth at bedtime  #100 x 3   Entered and Authorized by:   Roderick Pee MD   Signed by:   Roderick Pee MD on 05/31/2009   Method used:  Print then Give to Patient   RxID:   [id number] GLIPIZIDE 10 MG  TB24 (GLIPIZIDE) Take 1 tablet by mouth two times a day  #200 x 3   Entered and Authorized by:   Roderick Pee MD   Signed by:   Roderick Pee MD on 05/31/2009   Method used:   Print then Give to Patient   RxID:   [id number] CLONIDINE HCL 0.1 MG  TABS (CLONIDINE HCL) Take 1 tablet by mouth once a day  #100 x 3   Entered and Authorized by:   Roderick Pee MD   Signed by:   Roderick Pee MD on 05/31/2009   Method used:   Print then Give to Patient   RxID:   [id number] GLOVFI 100-25 MG  TABS (LOSARTAN POTASSIUM-HCTZ) Take 1 tablet by mouth once a day  #100 x 3   Entered and Authorized by:   Roderick Pee MD   Signed by:   Roderick Pee MD on 05/31/2009   Method used:   Print then Give to Patient   RxID:   [id number]   Appended Document: [facility]//CCM  Laboratory Results   Urine Tests    Routine Urinalysis   Color: yellow Appearance: Clear Glucose: 2+   (Normal Range: Negative) Bilirubin: negative   (Normal Range: Negative) Ketone: trace (5)   (Normal Range: Negative) Spec. Gravity: 1.025   (Normal Range: 1.003-1.035) Blood: negative   (Normal Range: Negative) pH: 5.5   (Normal Range: 5.0-8.0) Protein: 1+   (Normal Range: Negative) Urobilinogen: 1.0   (Normal Range: 0-1) Nitrite: negative   (Normal Range: Negative) Leukocyte Esterace: negative   (Normal Range: Negative)    Comments: Rita Ohara  May 31, 2009 3:55  PM

## 2010-06-23 NOTE — Progress Notes (Signed)
  Phone Note Call from Patient   Caller: Patient Call For: Roderick Pee MD Summary of Call: Gabapentin 100 mg 2 three times a day Needs refill [facility] [phone] Initial call taken by: [address] [facility] CMA AAMA,  June 08, 2010 12:11 PM  Follow-up for Phone Call        ok #100 ref x 3 Follow-up by: Roderick Pee MD,  June 08, 2010 12:16 PM    Prescriptions: NEURONTIN 100 MG CAPS (GABAPENTIN) as directed by your physician  #100 x 3   Entered by:   Lynann Beaver CMA AAMA   Authorized by:   Roderick Pee MD   Signed by:   Lynann Beaver CMA AAMA on 06/08/2010   Method used:   Telephoned to ...       Hospital doctor (retail)       [address]       [place], [place]  [address]       Ph: [phone] or [phone]       Fax: [phone]   RxID:   [id number]

## 2010-06-23 NOTE — Assessment & Plan Note (Signed)
Summary: ?bronchitis/njr   Vital Signs:  Patient profile:   60 year old male Weight:      242 pounds Temp:     99.3 degrees F oral BP sitting:   119 / 74  (left arm) Cuff size:   regular  Vitals Entered By: Kern Reap CMA Duncan Dull) (July 01, 2009 9:37 AM)  Reason for Visit head and chest congestion  History of Present Illness: sig is a 60 year old, married man nonsmoker, who comes in today for evaluation of a flulike illness, x 6 days.  Six days ago, developed fever and chills and cough.  He has no earache, sore throat, nausea, vomiting, or diarrhea.  Over the last couple days she developed a sensation of tightness in his chest and greenish sputum, but fever has gone away.  He is a nonsmoker.  No history of previous lung disease  Allergies: No Known Drug Allergies  Past History:  Past medical, surgical, family and social histories (including risk factors) reviewed for relevance to current acute and chronic problems.  Past Medical History: Reviewed history from 02/13/2007 and no changes required. Allergic rhinitis Diabetes mellitus, type II Hyperlipidemia Hypertension erectile dysfunction  Past Surgical History: Reviewed history from 02/13/2007 and no changes required. cardiac cath  2000-10-30 Cholecystectomy  30-Oct-2004  Family History: Reviewed history from 08/13/2007 and no changes required. father of 54, coronary disease  and outer diseasemother history of depression, colon cancer, died in 30-Oct-2000 one brother in good health  Social History: Reviewed history from 05/03/2007 and no changes required. Occupation: Married Never Smoked Alcohol use-yes Drug use-no Regular exercise-yes  Review of Systems      See HPI  Physical Exam  General:  Well-developed,well-nourished,in no acute distress; alert,appropriate and cooperative throughout examination Head:  Normocephalic and atraumatic without obvious abnormalities. No apparent alopecia or balding. Eyes:  No corneal or  conjunctival inflammation noted. EOMI. Perrla. Funduscopic exam benign, without hemorrhages, exudates or papilledema. Vision grossly normal. Ears:  External ear exam shows no significant lesions or deformities.  Otoscopic examination reveals clear canals, tympanic membranes are intact bilaterally without bulging, retraction, inflammation or discharge. Hearing is grossly normal bilaterally. Nose:  External nasal examination shows no deformity or inflammation. Nasal mucosa are pink and moist without lesions or exudates. Mouth:  Oral mucosa and oropharynx without lesions or exudates.  Teeth in good repair. Neck:  No deformities, masses, or tenderness noted. Chest Wall:  No deformities, masses, tenderness or gynecomastia noted. Lungs:  symmetrical breath sounds bilateral wheezing   Problems:  Medical Problems Added: 1)  Dx of Extrinsic Asthma, Unspecified  (ICD-493.00) 2)  Dx of Viral Infection-unspec  (ICD-079.99)  Impression & Recommendations:  Problem # 1:  VIRAL INFECTION-UNSPEC (ICD-079.99) Assessment New  His updated medication list for this problem includes:    Hydromet 5-1.5 Mg/26ml Syrp (Hydrocodone-homatropine) .[name] Kitchen... 1 or 2 tsps three times a day as needed  Problem # 2:  EXTRINSIC ASTHMA, UNSPECIFIED (ICD-493.00) Assessment: New  His updated medication list for this problem includes:    Prednisone 20 Mg Tabs (Prednisone) ..... Uad  Complete Medication List: 1)  Hyzaar 100-25 Mg Tabs (Losartan potassium-hctz) .... Take 1 tablet by mouth once a day 2)  Clonidine Hcl 0.1 Mg Tabs (Clonidine hcl) .... Take 1 tablet by mouth once a day 3)  Glipizide 10 Mg Tb24 (Glipizide) .... Take 1 tablet by mouth two times a day 4)  Lipitor 40 Mg Tabs (Atorvastatin calcium) .... Take 1 tablet by mouth at bedtime 5)  Aspirin 81 Mg  Tbec (aspirin)  .... Once daily 6)  Atenolol 25 Mg Tabs (Atenolol) .... Take 1 tablet by mouth once a day 7)  Viagra 50 Mg Tabs (Sildenafil citrate) .... As needed 8)   Glucophage 1000 Mg Tabs (Metformin hcl) .... Take one tab two times a day 9)  Onetouch Ultra Test Strp (Glucose blood) .... Use once daily to test glucose levels  dx 250.00 10)  One Touch Delica Lancets Misc (Lancets) .... Use once  daily to test glucose levels  dx 250.00 11)  Biaxin 500 Mg Tabs (Clarithromycin) .... Take 1 tablet by mouth two times a day 12)  Prednisone 20 Mg Tabs (Prednisone) .... Uad 13)  Hydromet 5-1.5 Mg/27ml Syrp (Hydrocodone-homatropine) .[name] Kitchen.. 1 or 2 tsps three times a day as needed  Patient Instructions: 1)  rest at home The rest of the week. 2)  Drinks 30 ounces of water daily, run  a vaporizer in her bedroom at night........[name] Kitchenbegin Biaxin 500 mg b.i.d., and prednisone two tabs daily return Monday for follow-up. 3)  You can also start Hydromet one to 2 teaspoons 3 times a day for cough 4)  check a fasting blood sugar daily.  Call me if it goes over 300 5)  hold the clonidine, and the cut the Hyzaar in half measure y  blood pressure every morning Prescriptions: HYDROMET 5-1.5 MG/5ML SYRP (HYDROCODONE-HOMATROPINE) 1 or 2 tsps three times a day as needed  #8oz x 1   Entered and Authorized by:   Roderick Pee MD   Signed by:   Roderick Pee MD on 07/01/2009   Method used:   Print then Give to Patient   RxID:   [id number] PREDNISONE 20 MG TABS (PREDNISONE) UAD  #30 x 1   Entered and Authorized by:   Roderick Pee MD   Signed by:   Roderick Pee MD on 07/01/2009   Method used:   Print then Give to Patient   RxID:   [id number] BIAXIN 500 MG TABS (CLARITHROMYCIN) Take 1 tablet by mouth two times a day  #20 x 1   Entered and Authorized by:   Roderick Pee MD   Signed by:   Roderick Pee MD on 07/01/2009   Method used:   Print then Give to Patient   RxID:   [id number]

## 2010-06-23 NOTE — Assessment & Plan Note (Signed)
Summary: fu bp/?low bp/njr   Vital Signs:  Patient profile:   60 year old male Weight:      249 pounds Temp:     97.6 degrees F oral BP sitting:   104 / 64  (left arm) Cuff size:   regular  Vitals Entered By: Kern Reap CMA Duncan Dull) (July 23, 2009 3:12 PM)  Reason for Visit follow up bp Is Patient Diabetic? Yes Did you bring your meter with you today? No CBG Result 109   History of Present Illness: Jonathan Calhoun is a 60 year old male, who comes in today because of weakness, and no energy.  His blood pressure is averaging 104.  Systolic diastolic has dropped to 64.  His random blood sugar today was 106.  These began a diet and exercise program along with no alcohol completely and this is probably why his blood sugar and blood pressure had dropped.  He is having symptoms of hypoglycemia around 11 a.m..  Allergies: No Known Drug Allergies  Past History:  Past medical, surgical, family and social histories (including risk factors) reviewed for relevance to current acute and chronic problems.  Past Medical History: Reviewed history from 02/13/2007 and no changes required. Allergic rhinitis Diabetes mellitus, type II Hyperlipidemia Hypertension erectile dysfunction  Past Surgical History: Reviewed history from 02/13/2007 and no changes required. cardiac cath  Oct 01, 2000 Cholecystectomy  10/01/04  Family History: Reviewed history from 08/13/2007 and no changes required. father of 69, coronary disease  and outer diseasemother history of depression, colon cancer, died in 10-01-00 one brother in good health  Social History: Reviewed history from 05/03/2007 and no changes required. Occupation: Married Never Smoked Alcohol use-yes Drug use-no Regular exercise-yes  Review of Systems      See HPI  Physical Exam  General:  Well-developed,well-nourished,in no acute distress; alert,appropriate and cooperative throughout examination   Impression & Recommendations:  Problem # 1:   HYPERTENSION (ICD-401.9) Assessment Deteriorated  His updated medication list for this problem includes:    Hyzaar 100-25 Mg Tabs (Losartan potassium-hctz) .[name] Kitchen... Take 1 tablet by mouth once a day    Clonidine Hcl 0.1 Mg Tabs (Clonidine hcl) .[name] Kitchen... Take 1 tablet by mouth once a day    Atenolol 25 Mg Tabs (Atenolol) .[name] Kitchen... Take 1 tablet by mouth once a day  Problem # 2:  DIABETES MELLITUS, TYPE II (ICD-250.00) Assessment: Improved  His updated medication list for this problem includes:    Hyzaar 100-25 Mg Tabs (Losartan potassium-hctz) .[name] Kitchen... Take 1 tablet by mouth once a day    Glipizide 10 Mg Tb24 (Glipizide) .[name] Kitchen... Take 1 tablet by mouth two times a day    Glucophage 1000 Mg Tabs (Metformin hcl) .[name] Kitchen... Take one tab two times a day  Orders: Capillary Blood Glucose/CBG ([address])  Complete Medication List: 1)  Hyzaar 100-25 Mg Tabs (Losartan potassium-hctz) .... Take 1 tablet by mouth once a day 2)  Clonidine Hcl 0.1 Mg Tabs (Clonidine hcl) .... Take 1 tablet by mouth once a day 3)  Glipizide 10 Mg Tb24 (Glipizide) .... Take 1 tablet by mouth two times a day 4)  Lipitor 40 Mg Tabs (Atorvastatin calcium) .... Take 1 tablet by mouth at bedtime 5)  Aspirin 81 Mg Tbec (aspirin)  .... Once daily 6)  Atenolol 25 Mg Tabs (Atenolol) .... Take 1 tablet by mouth once a day 7)  Viagra 50 Mg Tabs (Sildenafil citrate) .... As needed 8)  Glucophage 1000 Mg Tabs (Metformin hcl) .... Take one tab two times a day 9)  Onetouch Ultra Test Strp (Glucose blood) .... Use once daily to test glucose levels  dx 250.00 10)  Onetouch Delica Lancets Misc (Lancets) .... Use once daily to test glucose levels dx 250.00  Patient Instructions: 1)  hold the Hyzaar, clonidine, and atenolol until your blood pressure comes up above normal 140/90.  One should blood pressure rises to that level.  Restart the Hyzaar by taking one half tablet q.a.m. check a morning blood pressure daily. 2)  Decreased the glipizide to 10 mg dose one  half tablet twice a day and decrease the Glucophage 1000 mg dose one half tablet twice daily starting Sunday.  Check a blood sugar every morning.  Return in two weeks for follow-up

## 2010-06-23 NOTE — Assessment & Plan Note (Signed)
Summary: 2 WK ROV (30 MIN PER DR Kooper Godshall) // RS   Vital Signs:  Patient profile:   60 year old male Weight:      261 pounds Temp:     97.6 degrees F oral BP sitting:   120 / 90  (left arm) Cuff size:   regular  Vitals Entered By: Kern Reap CMA Duncan Dull) (June 14, 2009 4:30 PM)  Reason for Visit follow up labs   History of Present Illness:  Jonathan Calhoun  is a 60 year old, married male, nonsmokerwho comes in today for follow-up of diabetes.  His fasting blood sugar was over 200.  His A1c was 10.  He now is on a strict diet and exercise program and with increased his Glucotrol to 10 mg b.i.d. and Glucophage 1000 mg b.i.d.  His blood sugar in the past week has come down.  He has one that was 113.  his weight is down 5 pounds via diet and exercise  Allergies: No Known Drug Allergies  Past History:  Past medical, surgical, family and social histories (including risk factors) reviewed for relevance to current acute and chronic problems.  Past Medical History: Reviewed history from 02/13/2007 and no changes required. Allergic rhinitis Diabetes mellitus, type II Hyperlipidemia Hypertension erectile dysfunction  Past Surgical History: Reviewed history from 02/13/2007 and no changes required. cardiac cath  28-Oct-2000 Cholecystectomy  Oct 28, 2004  Family History: Reviewed history from 08/13/2007 and no changes required. father of 63, coronary disease  and outer diseasemother history of depression, colon cancer, died in 10/28/2000 one brother in good health  Social History: Reviewed history from 05/03/2007 and no changes required. Occupation: Married Never Smoked Alcohol use-yes Drug use-no Regular exercise-yes  Review of Systems      See HPI  Physical Exam  General:  Well-developed,well-nourished,in no acute distress; alert,appropriate and cooperative throughout examination   Impression & Recommendations:  Problem # 1:  DIABETES MELLITUS, TYPE II (ICD-250.00) Assessment Improved  The  following medications were removed from the medication list:    Glucophage 500 Mg Tabs (Metformin hcl) .[name] Kitchen... 1 < pm meal His updated medication list for this problem includes:    Hyzaar 100-25 Mg Tabs (Losartan potassium-hctz) .[name] Kitchen... Take 1 tablet by mouth once a day    Glipizide 10 Mg Tb24 (Glipizide) .[name] Kitchen... Take 1 tablet by mouth two times a day    Glucophage 1000 Mg Tabs (Metformin hcl) .[name] Kitchen... Take one tab two times a day  Orders: Prescription Created Electronically [id number])  Complete Medication List: 1)  Hyzaar 100-25 Mg Tabs (Losartan potassium-hctz) .... Take 1 tablet by mouth once a day 2)  Clonidine Hcl 0.1 Mg Tabs (Clonidine hcl) .... Take 1 tablet by mouth once a day 3)  Glipizide 10 Mg Tb24 (Glipizide) .... Take 1 tablet by mouth two times a day 4)  Lipitor 40 Mg Tabs (Atorvastatin calcium) .... Take 1 tablet by mouth at bedtime 5)  Aspirin 81 Mg Tbec (aspirin)  .... Once daily 6)  Atenolol 25 Mg Tabs (Atenolol) .... Take 1 tablet by mouth once a day 7)  Viagra 50 Mg Tabs (Sildenafil citrate) .... As needed 8)  Glucophage 1000 Mg Tabs (Metformin hcl) .... Take one tab two times a day 9)  Onetouch Ultra Test Strp (Glucose blood) .... Use once daily to test glucose levels  dx 250.00 10)  One Touch Delica Lancets Misc (Lancets) .... Use once  daily to test glucose levels  dx 250.00  Patient Instructions: 1)  See your eye doctor yearly to  check for diabetic eye damage. 2)  Please schedule a follow-up appointment in 3 months. 3)  BMP prior to visit, ICD-9:...........250.00 4)  HbgA1C prior to visit, ICD-9: Prescriptions: ONE TOUCH DELICA LANCETS  MISC (LANCETS) use once  daily to test glucose levels  dx 250.00  #100 x 3   Entered by:   Kern Reap CMA (AAMA)   Authorized by:   Roderick Pee MD   Signed by:   Kern Reap CMA (AAMA) on 06/14/2009   Method used:   Faxed to ...       Hospital doctor (retail)       [address]       [place], [place]  [address]       Ph: [phone] or [phone]       Fax: [phone]   RxID:   [id number] ONETOUCH ULTRA TEST  STRP (GLUCOSE BLOOD) use once daily to test glucose levels  dx 250.00  #100 x 3   Entered by:   Kern Reap CMA (AAMA)   Authorized by:   Roderick Pee MD   Signed by:   Kern Reap CMA (AAMA) on 06/14/2009   Method used:   Faxed to ...       Hospital doctor (retail)       [address]       [place], [place]  [address]       Ph: [phone] or [phone]       Fax: [phone]   RxID:   [id number]

## 2010-06-23 NOTE — Assessment & Plan Note (Signed)
Summary: 2 wk rov/njr   Vital Signs:  Patient profile:   60 year old male Weight:      240 pounds Temp:     98.4 degrees F oral BP sitting:   140 / 90  (left arm) Cuff size:   regular  Vitals Entered By: Kern Reap CMA Duncan Dull) (August 06, 2009 4:21 PM)  Reason for Visit follow up office visit  History of Present Illness: Sig. is a 60 year old, married male, nonsmoker, who comes in today for follow-up of hypertension and diabetes  We drastically cut his medication down.  See the note from two weeks ago because his blood pressure had dropped as his blood sugar.  Also dropped to, is now been alcohol free for a couple months.  Continues on a good diet and weight loss.  No lightheadedness when he stands up.  BP at home 110/70 fasting blood sugar 110  Allergies: No Known Drug Allergies  Past History:  Past medical, surgical, family and social histories (including risk factors) reviewed for relevance to current acute and chronic problems.  Past Medical History: Reviewed history from 02/13/2007 and no changes required. Allergic rhinitis Diabetes mellitus, type II Hyperlipidemia Hypertension erectile dysfunction  Past Surgical History: Reviewed history from 02/13/2007 and no changes required. cardiac cath  16-Oct-2000 Cholecystectomy  16-Oct-2004  Family History: Reviewed history from 08/13/2007 and no changes required. father of 9, coronary disease  and outer diseasemother history of depression, colon cancer, died in 10-16-00 one brother in good health  Social History: Reviewed history from 05/03/2007 and no changes required. Occupation: Married Never Smoked Alcohol use-yes Drug use-no Regular exercise-yes  Review of Systems      See HPI  Physical Exam  General:  Well-developed,well-nourished,in no acute distress; alert,appropriate and cooperative throughout examination   Impression & Recommendations:  Problem # 1:  HYPERTENSION (ICD-401.9) Assessment Improved  His  updated medication list for this problem includes:    Hyzaar 100-25 Mg Tabs (Losartan potassium-hctz) .[name] Kitchen... Take 1 tablet by mouth once a day    Clonidine Hcl 0.1 Mg Tabs (Clonidine hcl) .[name] Kitchen... Take 1 tablet by mouth once a day    Atenolol 25 Mg Tabs (Atenolol) .[name] Kitchen... Take 1 tablet by mouth once a day  Problem # 2:  DIABETES MELLITUS, TYPE II (ICD-250.00) Assessment: Improved  The following medications were removed from the medication list:    Glucophage 1000 Mg Tabs (Metformin hcl) .[name] Kitchen... Take one tab two times a day His updated medication list for this problem includes:    Hyzaar 100-25 Mg Tabs (Losartan potassium-hctz) .[name] Kitchen... Take 1 tablet by mouth once a day    Glipizide 10 Mg Tb24 (Glipizide) .[name] Kitchen... Take 1 tablet by mouth two times a day    Metformin Hcl 500 Mg Tabs (Metformin hcl) .[name] Kitchen... Take one tab two times a day  Complete Medication List: 1)  Hyzaar 100-25 Mg Tabs (Losartan potassium-hctz) .... Take 1 tablet by mouth once a day 2)  Clonidine Hcl 0.1 Mg Tabs (Clonidine hcl) .... Take 1 tablet by mouth once a day 3)  Glipizide 10 Mg Tb24 (Glipizide) .... Take 1 tablet by mouth two times a day 4)  Lipitor 40 Mg Tabs (Atorvastatin calcium) .... Take 1 tablet by mouth at bedtime 5)  Aspirin 81 Mg Tbec (aspirin)  .... Once daily 6)  Atenolol 25 Mg Tabs (Atenolol) .... Take 1 tablet by mouth once a day 7)  Viagra 50 Mg Tabs (Sildenafil citrate) .... As needed 8)  Onetouch Ultra Test Strp (  Glucose blood) .... Use once daily to test glucose levels  dx 250.00 9)  Onetouch Delica Lancets Misc (Lancets) .... Use once daily to test glucose levels dx 250.00 10)  Metformin Hcl 500 Mg Tabs (Metformin hcl) .... Take one tab two times a day  Patient Instructions: 1)  Please schedule a follow-up appointment in 3 months. 2)  BMP prior to visit, ICD-9: 3)  HbgA1C prior to visit, ICD-9:

## 2010-06-23 NOTE — Assessment & Plan Note (Signed)
Summary: SHINGLES CONCERNS // RS   Vital Signs:  Patient profile:   60 year old male Temp:     97.7 degrees F oral BP sitting:   120 / 84  (left arm) Cuff size:   regular  Vitals Entered By: Kern Reap CMA Duncan Dull) (April 29, 2010 11:01 AM)  Contraindications/Deferment of Procedures/Staging:    Test/Procedure: Weight Refused    Reason for deferment: patient declined-cannot calculate BMI  CC: shingles Is Patient Diabetic? Yes Did you bring your meter with you today? No   CC:  shingles.  History of Present Illness: Sig is a 20 r, who comes in today for evaluation of a sensation of hyperesthesia and tingling in his right lower extremity for the past two to 3 months.  He has had shingles twice in his life.  The first episode was in the right anterior chest wall.  That resolved after 4 weeks with no sequelae.  The second episode occurred last spring on the left chest wall.  However, this episode took a long time for the pain to go away.  We had him on gabapentin and oxycodone because of severe pain.  It finally completely  resolved.  He is off all those medications.  He's also had a history of lumbar disk disease.  Most recently for the past couple months.  He developed a sensation of tingling in his right lower extremity that comes and goes.  He denies any pain.  It states it keeps him awake at night when he turns it triggers it.  It began as noticed above.  He has had a history of lumbar disk disease in the past, although denies having no pain.  Neurologic review of systems otherwise negative.  His diabetes mellitus, managed with metformin 500  mg b.i.d., and glipizide 5 mg b.i.d.his last hemoglobin A1c was in the spring of 2011.......... 6.4%.  Allergies: No Known Drug Allergies  Past History:  Past medical, surgical, family and social histories (including risk factors) reviewed for relevance to current acute and chronic problems.  Past Medical History: Reviewed history from  02/13/2007 and no changes required. Allergic rhinitis Diabetes mellitus, type II Hyperlipidemia Hypertension erectile dysfunction  Past Surgical History: Reviewed history from 02/13/2007 and no changes required. cardiac cath  Oct 19, 2000 Cholecystectomy  10/19/04  Family History: Reviewed history from 08/13/2007 and no changes required. father of 54, coronary disease  and outer diseasemother history of depression, colon cancer, died in 10-19-00 one brother in good health  Social History: Reviewed history from 05/03/2007 and no changes required. Occupation: Married Never Smoked Alcohol use-yes Drug use-no Regular exercise-yes  Review of Systems      See HPI  Physical Exam  General:  Well-developed,well-nourished,in no acute distress; alert,appropriate and cooperative throughout examination Msk:  No deformity or scoliosis noted of thoracic or lumbar spine.   Pulses:  R and L carotid,radial,femoral,dorsalis pedis and posterior tibial pulses are full and equal bilaterally Extremities:  No clubbing, cyanosis, edema, or deformity noted with normal full range of motion of all joints.   Neurologic:  sensation and muscle strength are all normal.  Reflexes are diminished bilaterally.  No clonus.  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 Take 1 tablet  by mouth once a day    Metformin Hcl 1000 Mg Tabs (Metformin hcl) .[name] Kitchen... Take one tab by mouth two times a day His updated medication list for this problem includes:    Metformin Hcl 500 Mg Tabs (Metformin hcl) .[name] Kitchen... Take one tab two times a day    Glipizide 5 Mg Tabs (Glipizide) .[name] Kitchen... Take one tab by mouth two times a day    Lisinopril 5  Mg Tabs (Lisinopril) .[name] Kitchen... Take 1 tablet by mouth every morning  Orders: Neurology Referral (Neuro) Venipuncture ([address]) TLB-Lipid Panel (80061-LIPID) TLB-BMP (Basic Metabolic Panel-BMET) (80048-METABOL) TLB-CBC Platelet - w/Differential (85025-CBCD) TLB-Hepatic/Liver Function Pnl (80076-HEPATIC) TLB-TSH (Thyroid Stimulating Hormone) (84443-TSH) TLB-A1C / Hgb A1C (Glycohemoglobin) (83036-A1C) TLB-Microalbumin/Creat Ratio, Urine (82043-MALB) TLB-PSA (Prostate Specific Antigen) (84153-PSA) Specimen Handling ([address])  Complete Medication List: 1)  Lipitor 40 Mg Tabs (Atorvastatin calcium) .... Take 1 tablet by mouth at bedtime 2)  Aspirin 81 Mg Tbec (aspirin)  .... Once daily 3)  Viagra 50 Mg Tabs (Sildenafil citrate) .... As needed 4)  Onetouch Ultra Test Strp (Glucose blood) .... Use once daily to test glucose levels  dx 250.00 5)  Onetouch Delica Lancets Misc (Lancets) .... Use once daily to test glucose levels dx 250.00 6)  Metformin Hcl 500 Mg Tabs (Metformin hcl) .... Take one tab two times a day 7)  Glipizide 5 Mg Tabs (Glipizide) .... Take one tab by mouth two times a day 8)  Lisinopril 5 Mg Tabs (Lisinopril) .... Take 1 tablet by mouth every morning 9)  Vicodin Es 7.5-750 Mg Tabs (Hydrocodone-acetaminophen) .[name] Kitchen.. 1 tab @ bedtime  Patient Instructions: 1)  we will set up a consult with Dr. love. 2)  I will call you and I get your lab work back Prescriptions: VICODIN ES 7.5-750 MG TABS (HYDROCODONE-ACETAMINOPHEN) 1 tab @ bedtime  #30 x 2   Entered and Authorized by:   Roderick Pee MD   Signed by:   Roderick Pee MD on 04/29/2010   Method used:   Print then Give to Patient   RxID:   [id number] LISINOPRIL 5 MG TABS (LISINOPRIL) Take 1 tablet by mouth every morning  #100 x 3   Entered and Authorized by:   Roderick Pee MD   Signed by:   Roderick Pee MD on 04/29/2010   Method used:   Print then Give to Patient   RxID:   [id number]    Orders Added: 1)   Neurology Referral [Neuro] 2)  Venipuncture [41324] 3)  TLB-Lipid Panel [80061-LIPID] 4)  TLB-BMP (Basic Metabolic Panel-BMET) [80048-METABOL] 5)  TLB-CBC Platelet - w/Differential [85025-CBCD] 6)  TLB-Hepatic/Liver Function Pnl [80076-HEPATIC] 7)  TLB-TSH (Thyroid Stimulating Hormone) [84443-TSH] 8)  TLB-A1C / Hgb A1C (Glycohemoglobin) [83036-A1C] 9)  TLB-Microalbumin/Creat Ratio, Urine [82043-MALB] 10)  TLB-PSA (Prostate Specific Antigen) [84153-PSA] 11)  Est. Patient Level IV [40102] 12)  Specimen Handling [99000]

## 2010-06-23 NOTE — Assessment & Plan Note (Signed)
Summary: 4 day rov/njr   Vital Signs:  Patient profile:   60 year old male Weight:      248 pounds Temp:     98.6 degrees F oral BP sitting:   138 / 90  (left arm) Cuff size:   regular  Vitals Entered By: Kern Reap CMA Duncan Dull) (July 05, 2009 1:47 PM)  Reason for Visit follow up office visit  History of Present Illness: sig is a 60 year old, married male, nonsmoker, who comes in today for evaluation of asthma.  He was seen on February the 10th with severe wheezing.  He was started on prednisone, Biaxin, Hydromet for cough, and rest at home.  He also has underlying diabetes.  On 40 mg of prednisone daily.  His blood sugar rose to 170 to 180.  He other flies feels almost back to normal  Allergies: No Known Drug Allergies  Past History:  Past medical, surgical, family and social histories (including risk factors) reviewed, and no changes noted (except as noted below).  Past Medical History: Reviewed history from 02/13/2007 and no changes required. Allergic rhinitis Diabetes mellitus, type II Hyperlipidemia Hypertension erectile dysfunction  Past Surgical History: Reviewed history from 02/13/2007 and no changes required. cardiac cath  Oct 24, 2000 Cholecystectomy  Oct 24, 2004  Family History: Reviewed history from 08/13/2007 and no changes required. father of 79, coronary disease  and outer diseasemother history of depression, colon cancer, died in 2000/10/24 one brother in good health  Social History: Reviewed history from 05/03/2007 and no changes required. Occupation: Married Never Smoked Alcohol use-yes Drug use-no Regular exercise-yes  Review of Systems      See HPI  Physical Exam  General:  Well-developed,well-nourished,in no acute distress; alert,appropriate and cooperative throughout examination Neck:  No deformities, masses, or tenderness noted. Chest Wall:  No deformities, masses, tenderness or gynecomastia noted. Lungs:  Normal respiratory effort, chest expands  symmetrically. Lungs are clear to auscultation, no crackles or wheezes.   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 Uad  Complete Medication List: 1)  Hyzaar 100-25 Mg Tabs (Losartan potassium-hctz) .... Take 1 tablet by mouth once a day 2)  Clonidine Hcl 0.1 Mg Tabs (Clonidine hcl) .... Take 1 tablet by mouth once a day 3)  Glipizide 10 Mg Tb24 (Glipizide) .... Take 1 tablet by mouth two times a day 4)  Lipitor 40 Mg Tabs (Atorvastatin calcium) .... Take 1 tablet by mouth at bedtime 5)  Aspirin 81 Mg Tbec (aspirin)  .... Once daily 6)  Atenolol 25 Mg Tabs (Atenolol) .... Take 1 tablet by mouth once a day 7)  Viagra 50 Mg Tabs (Sildenafil citrate) .... As needed 8)  Glucophage 1000 Mg Tabs (Metformin hcl) .... Take one tab two times a day 9)  Onetouch Ultra Test Strp (Glucose blood) .... Use once daily to test glucose levels  dx 250.00 10)  One Touch Delica Lancets Misc (Lancets) .... Use once  daily to test glucose levels  dx 250.00 11)  Biaxin 500 Mg Tabs (Clarithromycin) .... Take 1 tablet by mouth two times a day 12)  Prednisone 20 Mg Tabs (Prednisone) .... Uad 13)  Hydromet 5-1.5 Mg/47ml Syrp (Hydrocodone-homatropine) .[name] Kitchen.. 1 or 2 tsps three times a day as needed  Patient Instructions: 1)  finished the antibiotic.  Taper the prednisone, one tablet daily, x 3 days, a half x 3 days, then  stop. 2)  Return p.r.n.

## 2010-06-23 NOTE — Assessment & Plan Note (Signed)
Summary: recurring lower back pain/cjr   Vital Signs:  Patient profile:   60 year old male Temp:     98.6 degrees F oral BP sitting:   142 / 92  (left arm) Cuff size:   regular  Vitals Entered By: Kern Reap CMA Duncan Dull) (October 21, 2009 4:19 PM) CC: lower back pain   CC:  lower back pain.  History of Present Illness: sig is a 60 year old male, who comes in today for evaluation of low back pain.  His pain is gone, worse over the past two to 3 months.  Previously was in his right lower back, now it's in his left.  The pain in his right lower back, radiates down to his right knee, but no further pain.  He denies any neurologic symptoms.  No history of trauma.  Previous MRI to 3 years ago, showed a lot of degenerative joint changes.  Neurologic consultation by Dr. Venetia Maxon recommended physical therapy, which he did in the pain improved a lot, but now it's worst pain.  His pain now on a scale of one to 10  between a 3 and a 10.  Allergies: No Known Drug Allergies  Social History: Reviewed history from 05/03/2007 and no changes required. Occupation: Married Never Smoked Alcohol use-yes Drug use-no Regular exercise-yes  Review of Systems      See HPI  Physical Exam  General:  Well-developed,well-nourished,in no acute distress; alert,appropriate and cooperative throughout examination Msk:  No deformity or scoliosis noted of thoracic or lumbar spine.   Pulses:  R and L carotid,radial,femoral,dorsalis pedis and posterior tibial pulses are full and equal bilaterally Extremities:  No clubbing, cyanosis, edema, or deformity noted with normal full range of motion of all joints.   Neurologic:  No cranial nerve deficits noted. Station and gait are normal. Plantar reflexes are down-going bilaterally. DTRs are symmetrical throughout. Sensory, motor and coordinative functions appear intact.   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 Take 1 tablet by mouth once a day 2)  Lipitor 40 Mg Tabs (Atorvastatin calcium) .... Take 1 tablet by mouth at bedtime 3)  Aspirin 81 Mg Tbec (aspirin)  .... Once daily 4)  Viagra 50 Mg Tabs (Sildenafil citrate) .... As needed 5)  Onetouch Ultra Test Strp (Glucose blood) .... Use once daily to test glucose levels  dx 250.00 6)  Onetouch Delica Lancets Misc (Lancets) .... Use once daily to test glucose levels dx 250.00 7)  Metformin Hcl 500 Mg Tabs (Metformin hcl) .... Take one tab two times a day 8)  Glipizide 5 Mg Tabs (Glipizide) .... Take one tab by mouth two times a day 9)  Vicodin Es 7.5-750 Mg Tabs (Hydrocodone-acetaminophen) .... 1/2 to 1 three times a day as needed 10)  Flexeril 10 Mg Tabs (Cyclobenzaprine hcl) .... Take 1 tablet by mouth three times a day  Patient Instructions: 1)  stay at bed rest today, Friday, Saturday........., Sunday, walk lie down, walk lie down.  Flexeril, and Vicodin, one half of each 3 to 4 times a day, why you're at bed rest.  Starting Sunday just take a half of Vicodin and/or Flexeril at bedtime.  We will also do to set up for PT ASAP. 2)  Take Motrin 600 mg 3 times a day with food as a baseline pain medication and anti-inflammatory return in two  weeks for follow-up Prescriptions: FLEXERIL 10 MG TABS (CYCLOBENZAPRINE HCL) Take 1 tablet by mouth three times a day  #50 x 0   Entered and Authorized by:   Roderick Pee MD   Signed by:   Roderick Pee MD on 10/21/2009   Method used:   Print then Give to Patient   RxID:   [id number] VICODIN ES 7.5-750 MG TABS (HYDROCODONE-ACETAMINOPHEN) 1/2 to 1 three times a day as needed  #50 x 0   Entered and Authorized by:   Roderick Pee MD   Signed by:   Roderick Pee MD on 10/21/2009   Method used:   Print then Give to Patient   RxID:   [id number]

## 2010-06-23 NOTE — Progress Notes (Signed)
Summary: shingles  Phone Note Call from Patient   Caller: Patient Call For: Roderick Pee MD Summary of Call: Pt has shingles, and out of Vicodin, and on the 10th  day of Acyclovir.  Is asking for guidance as pain is still severe.  [facility] [phone] Initial call taken by: Lynann Beaver CMA,  December 17, 2009 8:51 AM  Follow-up for Phone Call        gabapenin  in100 mg  #100 one three times a day for one week then 2 three times a day; OK to take with pain meds (vicodin)   Follow-up by: Gordy Savers  MD,  December 17, 2009 12:51 PM    New/Updated Medications: GABAPENTIN 100 MG CAPS (GABAPENTIN) 100 mg three times a day x one week, then 2 tid Prescriptions: GABAPENTIN 100 MG CAPS (GABAPENTIN) 100 mg three times a day x one week, then 2 tid  #100 x 0   Entered by:   Lynann Beaver CMA   Authorized by:   Gordy Savers  MD   Signed by:   Lynann Beaver CMA on 12/17/2009   Method used:   Faxed to ...       [facility]ail[facility]       [address]       [place], [place]  [address]       Ph: [phone] or [phone]       Fax: [phone]   RxID:   [id number]  Pt notified.

## 2010-06-23 NOTE — Progress Notes (Signed)
Addended by: Kern Reap on: 06/23/2010 02:00 PM   Modules accepted: Orders

## 2010-06-23 NOTE — Progress Notes (Signed)
Summary: change with metformin  Phone Note Refill Request Message from:  Fax from Pharmacy on February 24, 2010 8:58 AM  Refills Requested: Medication #1:  METFORMIN HCL 500 MG TABS take one tab two times a day  Follow-up for Phone Call        patient has been taking 1 tab two times a day for a month. is this okay to fill? Follow-up by: Kern Reap CMA Eisley Barber Dull),  February 24, 2010 8:59 AM  Additional Follow-up for Phone Call Additional follow up Details #1::        dispensed 200 tabs directions one p.o. b.i.d. before meals, refills x 2 Additional Follow-up by: Roderick Pee MD,  February 24, 2010 9:04 AM    New/Updated Medications: METFORMIN HCL 1000 MG TABS (METFORMIN HCL) take one tab by mouth two times a day Prescriptions: METFORMIN HCL 1000 MG TABS (METFORMIN HCL) take one tab by mouth two times a day  #200 x 2   Entered by:   Kern Reap CMA (AAMA)   Authorized by:   Roderick Pee MD   Signed by:   Kern Reap CMA (AAMA) on 02/24/2010   Method used:   Faxed to ...       Hospital doctor (retail)       [address]       [place], [place]  [address]       Ph: [phone] or [phone]       Fax: [phone]   RxID:   [id number]

## 2010-06-25 ENCOUNTER — Encounter (HOSPITAL_COMMUNITY): Payer: Self-pay

## 2010-06-25 ENCOUNTER — Ambulatory Visit (HOSPITAL_COMMUNITY)
Admission: RE | Admit: 2010-06-25 | Discharge: 2010-06-25 | Disposition: A | Discharge status: Home / Self Care | Payer: 59 | Source: Ambulatory Visit | Attending: Neurology | Admitting: Neurology

## 2010-06-25 DIAGNOSIS — M5137 Other intervertebral disc degeneration, lumbosacral region: Secondary | ICD-10-CM | POA: Insufficient documentation

## 2010-06-25 DIAGNOSIS — M545 Low back pain, unspecified: Secondary | ICD-10-CM | POA: Insufficient documentation

## 2010-06-25 DIAGNOSIS — M25559 Pain in unspecified hip: Secondary | ICD-10-CM | POA: Insufficient documentation

## 2010-06-25 DIAGNOSIS — IMO0002 Reserved for concepts with insufficient information to code with codable children: Secondary | ICD-10-CM | POA: Insufficient documentation

## 2010-06-25 DIAGNOSIS — M51379 Other intervertebral disc degeneration, lumbosacral region without mention of lumbar back pain or lower extremity pain: Secondary | ICD-10-CM | POA: Insufficient documentation

## 2010-06-25 DIAGNOSIS — M519 Unspecified thoracic, thoracolumbar and lumbosacral intervertebral disc disorder: Secondary | ICD-10-CM | POA: Insufficient documentation

## 2010-06-27 ENCOUNTER — Other Ambulatory Visit: Payer: Self-pay | Admitting: Family Medicine

## 2010-06-27 NOTE — Telephone Encounter (Signed)
Jonathan Calhoun. To renew medication until next physical exam

## 2010-06-27 NOTE — Telephone Encounter (Signed)
Time for a yearly physical

## 2010-07-23 ENCOUNTER — Other Ambulatory Visit: Payer: Self-pay | Admitting: Neurology

## 2010-07-23 ENCOUNTER — Ambulatory Visit (HOSPITAL_COMMUNITY)
Admission: RE | Admit: 2010-07-23 | Discharge: 2010-07-23 | Disposition: A | Discharge status: Home / Self Care | Payer: 59 | Source: Ambulatory Visit | Attending: Neurology | Admitting: Neurology

## 2010-07-23 ENCOUNTER — Other Ambulatory Visit (HOSPITAL_COMMUNITY): Payer: Self-pay | Admitting: Neurology

## 2010-07-23 DIAGNOSIS — M25559 Pain in unspecified hip: Secondary | ICD-10-CM | POA: Insufficient documentation

## 2010-07-23 DIAGNOSIS — M1610 Unilateral primary osteoarthritis, unspecified hip: Secondary | ICD-10-CM | POA: Insufficient documentation

## 2010-07-23 DIAGNOSIS — M169 Osteoarthritis of hip, unspecified: Secondary | ICD-10-CM | POA: Insufficient documentation
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
## 2010-10-07 NOTE — H&P (Signed)
NAMEPRYCE, FOLTS NO.:  [id number]   MEDICAL RECORD NO.:  [id number]          PATIENT TYPE:  EMS   LOCATION:  MAJO                         FACILITY:  [facility]   PHYSICIAN:  Revonda Standard L. Rennis Harding, N.P. DATE OF BIRTH:  [birth year]   DATE OF ADMISSION:  01/16/2006  DATE OF DISCHARGE:                                HISTORY & PHYSICAL   PRIMARY CARE PHYSICIAN:  Tinnie Gens A. Tawanna Cooler, M.D.   CHIEF COMPLAINT:  Abdominal pain rule out appendicitis.   HISTORY OF PRESENT ILLNESS:  This is a 60 year old male patient with a  history of diabetes and hypertension and obesity who relates acute onset of  abdominal pain in the mid portion yesterday sometime around lunch, late  afternoon.  The patient initially felt the symptoms were indigestion, tried  to burp without any relief.  Later, the pain intensified and was more  localized to the right middle quadrant with radiation up into the right  shoulder.  He was having associated nausea but no emesis.  His last bowel  movement was at noon yesterday, after lunch.  The patient went to his  primary care physician and because of the location of the pain, there was  some concern the patient might have acute appendicitis.  He was sent to the  ER for further evaluation and surgical admission.  Since arrival, he has had  some laboratory values checked.  His white count is 7,200 with a neutrophil  count of 58%.  LFTs are mildly elevated with a total bilirubin of 1.7.  The  patient is on a Statin drug.  His amylase and lipase are normal.  CT of the  abdomen and pelvis is pending.   REVIEW OF SYSTEMS:  As above, otherwise negative.   PAST MEDICAL HISTORY:  1. Diabetes.  2. Hypertension.   PAST SURGICAL HISTORY:  Cholecystectomy in November 2006.   SOCIAL HISTORY:  Remote tobacco.  Averages 2 beers a day, more on the  weekends but less than a 6-pack.  He is married.  He works in  sales/distribution for a Magazine features editor.   FAMILY  HISTORY:  Positive for hypertension and CAD.   ALLERGIES:  NKDA.   CURRENT MEDICATIONS:  1. Aspirin 325 mg daily.  2. Atenolol 25 mg daily.  3. Clonidine 0.1 mg daily.  4. Glipizide 10 mg daily.  5. Hyzaar 10/25 daily.  6. Lipitor 40 mg daily.   PHYSICAL EXAMINATION:  GENERAL:  A pleasant male patient complaining of  right middle and right lower quadrant abdominal pain of less than 24 hours'  duration.  VITAL SIGNS:  Temp 97.7, blood pressure 133/88, pulse 76, respirations 20.  NEURO:  The patient is alert and oriented x3, moving all extremities x4, no  focal deficits.  HEENT:  Head normocephalic.  NECK:  Full and obese without appreciable adenopathy.  CHEST:  Bilateral lung sounds are clear to auscultation.  Respiratory effort  is non-labored.  CARDIAC:  S1 S2.  No rubs, murmurs, thrills, no gallops.  ABDOMEN:  Obese but soft.  No significant abdominal scarring noted.  Bowel  sounds  are present.  Slightly distended.  He is tender in the right middle  quadrant laterally without guarding or rebounding.  No tenderness over  McBurney's point.  EXTREMITIES:  Symmetrical in appearance without edema, cyanosis, or  clubbing.  Pulses are palpable.   LABORATORY:  White count 7,200, hemoglobin 14.5, platelets 153,000,  neutrophils 58%.  PT 14.2, INR 1.1, PTT 31.  Amylase 55, lipase 38.  Sodium  134, potassium 4.0, CO2 25, glucose 307, BUN 9, creatinine 0.8.  LFTs:  AST  57, __________  52, and total bilirubin is 1.7.   DIAGNOSTIC STUDIES:  CT of the abdomen and pelvis is pending.  Urinalysis is  also pending.  EKG shows no acute process, sinus rhythm.   IMPRESSION:  1. Right abdominal pain.  Differential includes A.  Appendicitis.  B.      Typhlitis.  C.  Mesenteric abscess, doubt white count is normal.  D.      Atypical presentation of diverticular disease with possible      perforation.  E.  Inflammatory bowel disease.  2. Diabetes, currently uncontrolled with hyperglycemia.  3.  Transaminitis, probably secondary to ongoing Statin use.   PLAN:  1. Follow up on CT of abdominal and pelvis by Dr. Janee Morn.  This will      determine whether surgical intervention is urgent or can be delayed.  2. Continue NPO status, IV fluids.  3. The patient had Demerol ordered per primary care physician.  Due to      side effects and problems related with Demerol, we will change this to      Dilaudid.  Continue Phenergan for nausea.  4. Follow up on chest x-ray.  5. Hold his aspirin in case surgical intervention or other procedures      indicated.   ADDITIONAL RECOMMENDATIONS:  Per Dr. Janee Morn.      Allison L. Rennis Harding, N.P.     ALE/MEDQ  D:  01/16/2006  T:  01/16/2006  Job:  [id number]   cc:   Tinnie Gens A. Tawanna Cooler, MD

## 2010-10-07 NOTE — Op Note (Signed)
NAMELESTON, SCHUELLER               ACCOUNT NO.:  [id number]   MEDICAL RECORD NO.:  [id number]          PATIENT TYPE:  AMB   LOCATION:  SDS                          FACILITY:  [facility]   PHYSICIAN:  Sharlet Salina T. Hoxworth, M.D.DATE OF BIRTH:  07-03-1950   DATE OF PROCEDURE:  04/19/2005  DATE OF DISCHARGE:                                 OPERATIVE REPORT   PREOPERATIVE DIAGNOSIS:  Cholelithiasis and chronic cholecystitis.   POSTOPERATIVE DIAGNOSIS:  Cholelithiasis and chronic cholecystitis.   SURGICAL PROCEDURE:  Laparoscopic cholecystectomy with intraoperative  cholangiogram.   SURGEON:  Sharlet Salina T. Hoxworth, M.D.   ASSISTANT:  Danna Hefty, M.D.   ANESTHESIA:  General.   BRIEF HISTORY:  Mr. Costlow is a 60 year old diabetic male who on a recent CT  scan of the chest was found to have cholelithiasis.  On further evaluation  and questioning, he has had a recurrent right mid-abdominal aching pain for  several months.  Also some dyspeptic symptoms as well.  The remainder of  workup has been negative and he is felt to likely have symptomatic  cholelithiasis.  After discussion of option and full discussion of risks  including bleeding, infection, bowel or bladder injury, we have elected  proceed with elective cholecystectomy with cholangiogram.   DESCRIPTION OF OPERATION:  The patient was brought to the operating room and  placed in supine position on the operating table and general endotracheal  anesthesia was induced.  He received preoperative IV antibiotics.  PAS were  in place.  The abdomen was widely sterilely prepped and draped.  Local  anesthesia was used to infiltrate the trocar sites.  A 1-cm incision was  made above the umbilicus in the midline and dissection was carried down to  the midline fascia, which was sharply incised for 1 cm and the peritoneum  entered directly.  Through a mattress sutures of 0 Vicryl, a Hasson trocar  was placed and pneumoperitoneum established.   Under direct vision, a 10-mm  trocar was placed in the subxiphoid area and two 5-mm trocars on the right  subcostal margin.  The liver was somewhat enlarged, slightly nodular,  apparently secondary to fatty infiltration.  There were some chronic omental  adhesions to the gallbladder which did appear significantly chronically  inflamed.  Omental adhesions were stripped away using blunt and cautery  dissection, the fundus grasped and elevated up over the liver.  The  infundibulum was grasped and retracted inferolaterally.  Fibrofatty tissue  was stripped off the neck of the gallbladder toward the porta hepatis.  As  this tissue was dissected away, the underlying gallbladder wall was very  thin and quite friable, and as this dissection continued down toward the  infundibulum, the gallbladder did tear at what appeared to be near the  junction of the gallbladder and the cystic duct.  At this point the cystic  artery, anterior and posterior branches, could be clearly identified, were  dissected free, controlled with 2 proximal and distal clips and divided.  A  number of gallstones were removed.  At this point I obtained an operative  cholangiogram with the [facility]  catheter through the opening at the junction  of the gallbladder and cystic duct.  This did confirm normal anatomy with  good filling of intrahepatic ducts, common bile duct and free flow into the  duodenum with no filling defects and the catheter did appear to be at the  junction of the distal gallbladder and cystic duct.  Following this, the  cholangiocath was removed.  I elected not to dissect down further toward the  common duct with the chronically inflamed condition of the tissue and the  very distal end of the gallbladder and proximal cystic duct were elevated  and completely encircled, and controlled with an Endoloop of PDS.  Following  this, the gallbladder was dissected free from its bed using hook cautery and  removed through  the umbilicus.  Hemostasis was obtained with cautery and a  Surgicel pack.  The right upper quadrants was thoroughly irrigated and few  other small stones removed.  I left a closed suction drain brought out  through one of the lateral trocar sites and placed in Morison's pouch.  Trocars were removed and all CO2 evacuated.  Incisions were closed with  running subcuticular Monocryl and Steri-Strips.  Sponge, needle and  instrument counts were correct.  The patient was taken to the recovery room  in good condition.      Lorne Skeens. Hoxworth, M.D.  Electronically Signed     BTH/MEDQ  D:  04/19/2005  T:  04/20/2005  Job:  [id number]

## 2010-10-07 NOTE — Cardiovascular Report (Signed)
. [facility]  Patient:    Jonathan, Calhoun Visit Number: [id number] MRN: [id number]          Service Type: MED Location: [facility] 2869 01 Attending Physician:  Evette Georges Dictated by:   Everardo Beals Juanda Chance, M.D. [facility] Proc. Date: 04/02/01 Admit Date:  04/01/2001   CC:         Evette Georges, M.D. [facility]  Cardiopulmonary Laboratory   Cardiac Catheterization  PROCEDURES PERFORMED: Cardiac catheterization.  CLINICAL INDICATIONS: The patient is 60 years old and is the husband of Claire Shown, our research nurse with [employer]. He has had no prior history of known heart disease but does have hypertension, diabetes and hyperlipidemia. He had shoulder surgery a week ago and the morning of admission woke up with shortness of breath and diaphoresis which persisted intermittently through the night. He was seen in the emergency room by Dr. Tawanna Cooler and there was concern regarding possible pulmonary embolism and a spiral CT scan was done which was negative. His CK and MBs were negative. Because of concern about his symptoms being related to myocardial ischemia, he was scheduled for evaluation with catheterization.  DESCRIPTION OF PROCEDURE: The procedure was performed via the right femoral artery using an arterial sheath and 6 French preformed coronary catheters.  A front wall arterial puncture was performed and Omnipaque contrast was used. Because of there was slightly slow flow in the LAD intracoronary verapamil 200 mg x2 was given. A distal aortogram was performed to rule out abdominal aortic aneurysm. The right femoral artery artery was closed with Perclose at the end of the procedure. The patient tolerated the procedure well and left the laboratory in satisfactory condition.  RESULTS: The left main coronary artery: The left main coronary artery was free of significant disease.  Left anterior descending: The left anterior descending  artery gave rise to three septal perforators and a large diagonal branch. These and the LAD proper were free of significant disease. It took about four cycles before the LAD to completely fill and following verapamil this improved to 1-2 cycles.  The circumflex artery filled with the first cycle.  Circumflex artery: The circumflex artery gave rise to an intermediate branch and an atrial branch and two large posterolateral branches. These vessels were free of significant disease.  Right coronary artery: The right coronary gave rise to a posterior descending branch and three posterolateral branches. These vessels were free of significant disease.  LEFT VENTRICULOGRAPHY: The left ventriculogram was performed in the RAO projection showed good wall motion. The apex was not really well opacified and there was very questionable slight hypokinesis of the apex. The ejection fraction was estimated at 55%.  CONCLUSIONS: Normal coronary angiography and left ventricular function.  RECOMMENDATIONS: In view of these findings, I think it is unlikely his recent symptoms are related to myocardial ischemia. We will plan reassurance. I discussed the situation with Dr. Tawanna Cooler and if he has recurrent symptoms will evaluate other possible noncardiac causes for his symptoms. Dictated by:   Everardo Beals Juanda Chance, M.D. [facility] Attending Physician:  Evette Georges DD:  04/02/01 TD:  04/02/01 Job: [id number]

## 2010-10-07 NOTE — Discharge Summary (Signed)
[place]  Patient:    Jonathan Calhoun, Jonathan Calhoun Visit Number: [id number] MRN: [id number]          Service Type: MED Location: [facility] 2869 01 Attending Physician:  Jonathan Calhoun Dictated by:   Joellyn Rued, P.A.-C. Admit Date:  04/01/2001 Disc. Date: 04/02/01   CC:         Jonathan Calhoun, M.D. [facility]  Jonathan Calhoun, M.D. [facility]  Jonathan Calhoun, M.D.   Discharge Summary  DATE OF BIRTH:  28-Jan-1951  ADMITTING PHYSICIAN:  Dr. Juanda Calhoun.  DISCHARGING PHYSICIAN:  Dr. Juanda Calhoun.  SUMMARY OF HISTORY:  Jonathan Calhoun is a 60 year old white male, who presented to [facility] Emergency Room complaining of shortness of breath.  At approximately 1 a.m. on the 11th, got up to use the bathroom and after lying back down noticed shortness of breath associated with diaphoresis.  He did not have any chest discomfort.  He was very restless and unable to return to sleep. At 7 a.m., he decided to get up, but he still had the above symptoms. He also noted some nausea and tried to vomit without any improvement.  He laid back down on the couch; however, his symptoms continued.  His wife, Tovia Kisner, an R.N. that works with our practice checked his temperature which was 98, blood pressure 140/100, and his glucose 160.  He took some Pepto-Bismol without relief, and he took his home prescriptions with milk without any changes.  He has never felt this shortness of breath before.  He recently had a right rotator cuff repair approximately six days ago.  He states he has been doing well from that and has not had any complications and actually returned to work last Thursday.  He denies any pleuritic component, tenderness, injury, denies any prior exertional limitations, chest discomfort, shortness of breath, orthopnea, PND, edema, palpitations, syncope, dysuria, melena, hematuria, neurologic changes, weight changes.  He has had some slight constipation since his surgery.  This  is resolving, and he has nocturnal urination approximately two times per night.  His history is also notable for hypertension, non-insulin dependent diabetes approximately diagnosed in 2000. He does not follow a regular diet or exercise program.  It is noted that he has been under a lot of stress over the last two years with the death of his mother and son.  He was fired from a job and just started a new job this past summer.  He also has a history of hyperlipidemia.  He states his last check in the summer of 2002 was "okay."  PREADMISSION MEDICATIONS: 1. Hyzaar 100/50 mg q.d. 2. Glucotrol 10 a.m., 5 q.p.m. 3. Lipitor 10 mg q.h.s.  He quite smoking approximately 25 years ago.  Prior to that, he smoked a rare cigarette in college.  He uses approximately a 12-pack of beer per week.  LABORATORY DATA:  Admission EKG showed normal sinus rhythm, sinus bradycardia, and nonspecific ST-T wave changes.  Admission chest x-ray did not show any active disease.  Chest CT was negative for pulmonary embolism, and he did not have any evidence of DVT.  TSH was 1.045, sodium 136, potassium 4.4, BUN 12, creatinine .7, glucose 168.  LFTs were within normal limits.  Albumin was slightly low at 3.3.  CKs and troponins were negative for myocardial infarction.  H&H was 14.6 and 41.7, normal indices, platelets 166, WBC 5.7. Hemoglobin A1c was 6.5.  Fasting lipids are pending at the time of this dictation.  ABG on admission was 7.46, PCO2 35, PO2 182, with a bicarb of 25 on room air.  HOSPITAL COURSE:  Jonathan Calhoun was admitted to 3700.  Overnight, he continued to have some mild shortness of breath associated with exertion, and he complained of shoulder stiffness because he has not been moving around.  Enzymes and EKGs were negative for myocardial infarction.  After reviewing with Dr. Juanda Calhoun, Dr. Juanda Calhoun felt he should undergo cardiac catheterization.  This was performed on 04/02/01.  According to Dr. Delia Chimes  note, he has an EF of 55%, possible slight apical hypokinesis.  His coronary arteries did not show any significant coronary artery disease.  Dr. Juanda Calhoun closed catheterization site with Perclose closed sheath.  Post bed rest, he was ambulating without difficulty. Catheterization site was intact.  DISCHARGE DIAGNOSES: 1. Shortness of breath, unknown etiology.  Catheterization did not reveal any    evidence of coronary artery disease. 2. History of hypertension. 3. Diabetes. 4. Obesity. 5. Hyperlipidemia.  DISPOSITION:  He is discharged home.  DISCHARGE MEDICATIONS: 1. Coated aspirin 325 q.d. 2. Glucotrol 10 mg q.a.m. 5 mg q.p.m. 3. Lipitor 10 mg q.h.s. 4. Hyzaar 100/50 mg q.d.  He was advised no lifting or soaking for one week.  He was instructed no driving, sexual activity, or heavy exertion for two days.  He was given permission to return to work on Thursday.  Shower and remove dressing on 04/03/01.  Maintain low salt, low fat, low cholesterol ADA diet.  If he had any problems with his catheterization, he was asked to call us immediately. Dr. Tawanna Cooler asked him to check fasting blood sugars daily two hours after meals, check his blood pressure twice daily and record.  He will call Dr. Tawanna Cooler to make a one week appointment and bring blood pressure and blood sugars recordings to the office visit.  He will keep his appointment with orthopedics.  He was encouraged to begin following his ADA low salt diet and begin a regular exercise program for weight loss.  He may also need evaluation for sleep apnea given that his admission ABG showed a PO2 of 82; however, saturations remained greater than 95%. Dictated by:   Joellyn Rued, P.A.-C. Attending Physician:  Jonathan Calhoun DD:  04/02/01 TD:  04/02/01 Job: [id number]

## 2010-10-10 ENCOUNTER — Other Ambulatory Visit: Payer: Self-pay | Admitting: *Deleted

## 2010-10-10 MED ORDER — GLIPIZIDE 10 MG PO TABS: 10.0000 mg | ORAL_TABLET | Freq: Two times a day (BID) | ORAL | Status: DC

## 2011-01-07 ENCOUNTER — Other Ambulatory Visit: Payer: Self-pay | Admitting: Family Medicine

## 2011-02-13 ENCOUNTER — Encounter: Payer: Self-pay | Admitting: Family Medicine

## 2011-02-13 ENCOUNTER — Ambulatory Visit: Payer: 59 | Admitting: Family Medicine

## 2011-02-14 ENCOUNTER — Encounter: Payer: Self-pay | Admitting: Family Medicine

## 2011-02-14 ENCOUNTER — Ambulatory Visit (INDEPENDENT_AMBULATORY_CARE_PROVIDER_SITE_OTHER): Payer: 59 | Admitting: Family Medicine

## 2011-02-14 VITALS — BP 124/80 | Temp 99.2°F | Ht 73.0 in | Wt 228.0 lb

## 2011-02-14 DIAGNOSIS — E119 Type 2 diabetes mellitus without complications: Secondary | ICD-10-CM

## 2011-02-14 DIAGNOSIS — I10 Essential (primary) hypertension: Secondary | ICD-10-CM

## 2011-02-14 DIAGNOSIS — Z01818 Encounter for other preprocedural examination: Secondary | ICD-10-CM

## 2011-02-14 DIAGNOSIS — Z0000 Encounter for general adult medical examination without abnormal findings: Secondary | ICD-10-CM

## 2011-02-14 DIAGNOSIS — Z23 Encounter for immunization: Secondary | ICD-10-CM

## 2011-02-14 DIAGNOSIS — B0229 Other postherpetic nervous system involvement: Secondary | ICD-10-CM

## 2011-02-14 DIAGNOSIS — E785 Hyperlipidemia, unspecified: Secondary | ICD-10-CM

## 2011-02-14 LAB — POCT URINALYSIS DIPSTICK
Blood, UA: NEGATIVE
Glucose, UA: NEGATIVE
Leukocytes, UA: NEGATIVE
Nitrite, UA: NEGATIVE
Urobilinogen, UA: 0.2
pH, UA: 6

## 2011-02-14 MED ORDER — TRAMADOL HCL 50 MG PO TABS: ORAL_TABLET | ORAL | Status: DC

## 2011-02-14 MED ORDER — GLUCOSE BLOOD VI STRP: ORAL_STRIP | Status: DC

## 2011-02-14 MED ORDER — LISINOPRIL 5 MG PO TABS: 5.0000 mg | ORAL_TABLET | Freq: Every day | ORAL | Status: DC

## 2011-02-14 MED ORDER — GLIPIZIDE 10 MG PO TABS: 10.0000 mg | ORAL_TABLET | Freq: Every day | ORAL | Status: DC

## 2011-02-14 MED ORDER — METFORMIN HCL 1000 MG PO TABS: 1000.0000 mg | ORAL_TABLET | Freq: Every day | ORAL | Status: DC

## 2011-02-14 NOTE — Patient Instructions (Signed)
Continue your current medications.  I will call you the report on your lab work to discuss any changes we might need to make going forward.  Good luck  on your surgery.............. for medical standpoint.  I think y will   do well

## 2011-02-14 NOTE — Progress Notes (Signed)
  Subjective:    Patient ID: Jonathan Calhoun, male    DOB: [birth year], 60 y.o.   MRN: [id number]  HPI Sig. Is a 60 year old, married male, nonsmoker, who comes in today for preop exam.  He is going to have surgery on November the 11th total right hip replacement by Dr. Trudee Grip.    He has a history of underlying, diabetes.  He's currently taking Glucotrol 10 mg daily, metformin 1000 mg daily.  States home blood sugars are around 99.  He also takes lisinopril for renal protection.  He P1 24/80.  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  Three months ago.  He stopped the Lipitor 40 mg daily because he felt muscle aches.  He says he feels much better off of Lipitor.  We will check a lipid panel today.  He also takes an 81-mg baby aspirin tablet daily.  He gets routine eye care, dental care, colonoscopy, and GI, tetanus, 2008, Pneumovax, x 2, seasonal flu shot today.  He also states he does not need the Viagra   Review of Systems  Musculoskeletal: Positive for myalgias and gait problem.       Objective:   Physical Exam  Constitutional: He is oriented to person, place, and time. He appears well-developed and well-nourished.  HENT:  Head: Normocephalic and atraumatic.  Right Ear: External ear normal.  Left Ear: External ear normal.  Nose: Nose normal.  Mouth/Throat: Oropharynx is clear and moist.  Eyes: Conjunctivae and EOM are normal. Pupils are equal, round, and reactive to light.  Neck: Normal range of motion. Neck supple. No JVD present. No tracheal deviation present. No thyromegaly present.  Cardiovascular: Normal rate, regular rhythm, normal heart sounds and intact distal pulses.  Exam reveals no gallop and no friction rub.   No murmur heard. Pulmonary/Chest: Effort normal and breath sounds normal. No stridor. No respiratory distress. He has no wheezes. He has no rales. He exhibits no  tenderness.  Abdominal: Soft. Bowel sounds are normal. He exhibits no distension and no mass. There is no tenderness. There is no rebound and no guarding.  Genitourinary: Rectum normal, prostate normal and penis normal. Guaiac negative stool. No penile tenderness.  Musculoskeletal: Normal range of motion. He exhibits no edema and no tenderness.       Severe pain, right hip with any motion  Lymphadenopathy:    He has no cervical adenopathy.  Neurological: He is alert and oriented to person, place, and time. He has normal reflexes. No cranial nerve deficit. He exhibits normal muscle tone.  Skin: Skin is warm and dry. No rash noted. No erythema. No pallor.  Psychiatric: He has a normal mood and affect. His behavior is normal. Judgment and thought content normal.          Assessment & Plan:  Diabetes type II check A1 C.  History of hyperlipidemia off Lipitor 40 mg x 3 months because of myalgias.  Check lipid panel.  Hypertension.  Continue lisinopril 5 mg daily and a baby aspirin.  Degenerative joint disease right hip surgery November the seventh 2012

## 2011-02-15 LAB — BASIC METABOLIC PANEL
BUN: 13 mg/dL (ref 6–23)
CO2: 28 mEq/L (ref 19–32)
Chloride: 104 mEq/L (ref 96–112)
Creatinine, Ser: 0.8 mg/dL (ref 0.4–1.5)
Potassium: 5.5 mEq/L — ABNORMAL HIGH (ref 3.5–5.1)

## 2011-02-15 LAB — CBC WITH DIFFERENTIAL/PLATELET
Basophils Relative: 0.4 % (ref 0.0–3.0)
Eosinophils Absolute: 0.2 10*3/uL (ref 0.0–0.7)
Eosinophils Relative: 2 % (ref 0.0–5.0)
HCT: 46 % (ref 39.0–52.0)
Lymphs Abs: 1.9 10*3/uL (ref 0.7–4.0)
MCHC: 33.8 g/dL (ref 30.0–36.0)
MCV: 92.8 fl (ref 78.0–100.0)
Monocytes Absolute: 0.7 10*3/uL (ref 0.1–1.0)
Neutrophils Relative %: 73.2 % (ref 43.0–77.0)
Platelets: 185 10*3/uL (ref 150.0–400.0)
RBC: 4.96 Mil/uL (ref 4.22–5.81)

## 2011-02-15 LAB — LIPID PANEL
Cholesterol: 198 mg/dL (ref 0–200)
HDL: 40.9 mg/dL (ref >39.00)
Total CHOL/HDL Ratio: 5
Triglycerides: 107 mg/dL (ref 0.0–149.0)

## 2011-02-15 LAB — HEPATIC FUNCTION PANEL
ALT: 24 U/L (ref 0–53)
Bilirubin, Direct: 0.1 mg/dL (ref 0.0–0.3)
Total Protein: 7.1 g/dL (ref 6.0–8.3)

## 2011-03-21 ENCOUNTER — Encounter (HOSPITAL_COMMUNITY): Payer: Self-pay

## 2011-03-21 ENCOUNTER — Ambulatory Visit (HOSPITAL_COMMUNITY)
Admission: RE | Admit: 2011-03-21 | Discharge: 2011-03-21 | Disposition: A | Discharge status: Home / Self Care | Payer: 59 | Source: Ambulatory Visit | Attending: Orthopedic Surgery | Admitting: Orthopedic Surgery

## 2011-03-21 ENCOUNTER — Other Ambulatory Visit (HOSPITAL_COMMUNITY): Payer: Self-pay | Admitting: Orthopedic Surgery

## 2011-03-21 ENCOUNTER — Encounter (HOSPITAL_COMMUNITY): Payer: 59

## 2011-03-21 DIAGNOSIS — Z01812 Encounter for preprocedural laboratory examination: Secondary | ICD-10-CM | POA: Insufficient documentation

## 2011-03-21 DIAGNOSIS — M169 Osteoarthritis of hip, unspecified: Secondary | ICD-10-CM | POA: Insufficient documentation

## 2011-03-21 DIAGNOSIS — Z01811 Encounter for preprocedural respiratory examination: Secondary | ICD-10-CM | POA: Insufficient documentation

## 2011-03-21 DIAGNOSIS — M1610 Unilateral primary osteoarthritis, unspecified hip: Secondary | ICD-10-CM | POA: Insufficient documentation

## 2011-03-21 DIAGNOSIS — Z01818 Encounter for other preprocedural examination: Secondary | ICD-10-CM

## 2011-03-21 DIAGNOSIS — I10 Essential (primary) hypertension: Secondary | ICD-10-CM | POA: Insufficient documentation

## 2011-03-21 DIAGNOSIS — E119 Type 2 diabetes mellitus without complications: Secondary | ICD-10-CM | POA: Insufficient documentation

## 2011-03-21 LAB — CBC
Hemoglobin: 15.1 g/dL (ref 13.0–17.0)
Platelets: 185 10*3/uL (ref 150–400)
RBC: 4.79 MIL/uL (ref 4.22–5.81)
WBC: 8 10*3/uL (ref 4.0–10.5)

## 2011-03-21 LAB — URINALYSIS, ROUTINE W REFLEX MICROSCOPIC
Glucose, UA: NEGATIVE mg/dL
Leukocytes, UA: NEGATIVE
Nitrite: NEGATIVE
pH: 6 (ref 5.0–8.0)

## 2011-03-21 LAB — COMPREHENSIVE METABOLIC PANEL
ALT: 38 U/L (ref 0–53)
AST: 31 U/L (ref 0–37)
CO2: 27 mEq/L (ref 19–32)
Calcium: 9.6 mg/dL (ref 8.4–10.5)
Creatinine, Ser: 0.69 mg/dL (ref 0.50–1.35)
GFR calc Af Amer: >90 mL/min (ref >90)
GFR calc non Af Amer: >90 mL/min (ref >90)
Glucose, Bld: 97 mg/dL (ref 70–99)
Sodium: 136 mEq/L (ref 135–145)
Total Protein: 7 g/dL (ref 6.0–8.3)

## 2011-03-21 LAB — SURGICAL PCR SCREEN: Staphylococcus aureus: NEGATIVE

## 2011-03-21 LAB — APTT: aPTT: 33 seconds (ref 24–37)

## 2011-03-21 LAB — PROTIME-INR: Prothrombin Time: 13.1 seconds (ref 11.6–15.2)

## 2011-03-27 ENCOUNTER — Encounter (HOSPITAL_COMMUNITY): Payer: Self-pay | Admitting: Orthopedic Surgery

## 2011-03-27 NOTE — H&P (Signed)
Date of Admission: 03/29/2011  Chief Complaint:  right hip pain.  Subjective: Patient is admitted for right total hip arthroplasty.  Patient is a 60 y.o. male who has been seen by Dr. Lequita Halt for ongoing hip pain.  They have been followed and found to have continued progressive pain.  They have been treated conservatively in the past including medications.  Despite conservative measures, they continue to have pain.  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  It is felt that they would benefit from undergoing surgical intervention.  Risks and benefits have been discussed with the patient and they elect to proceed with surgery.  The patient has no contraindications to the upcoming procedure such as ongoing infection or progressive neurological disease. Ends  Allergies: No Known Allergies   Medications: Metformin Glipizide Lisinopril Aspirin  Past Medical History: Past Medical History  Diagnosis Date  . Allergy   . Hyperlipidemia   . Hypertension   . Diabetes mellitus     ORAL MEDS-NO INSULIN  . History of alcoholism   . History of shingles   . Ileus, postoperative     Post-op Chole Surgery     Past Surgical History: Past Surgical History  Procedure Date  . Cardiac catheterization 2002  . Cholecystectomy   . Right shoulder surgery for tear and spur - yrs ago      Family History: Family History  Problem Relation Age of Onset  . Depression Mother   . Cancer Mother     colon  . Coronary artery disease Father   . Hypertension Father   . Heart failure Mother     Social History: History  Substance Use Topics  . Smoking status: Never Smoker   . Smokeless tobacco: Not on file  . Alcohol Use: No     Review of Systems General: No chills, fevers, night sweats, fatigue. Neuro:  No seizures, syncope, paralysis, visual problems. Respiratory:  No shortness of breath, productive cough, hemoptysis. Cardiovascular: No chest pain,  angina, palpitations, orthopnea. GI: No nausea, vomiting, diarrhea, constipation. GU:  No dysuria, hematuria, discharge. Musculoskeletal:  Joint pain  Physical Exam:  Vitals: Pulse:64 Respirations:16 Blood Pressure:138/74  GENERAL: Patient is a 60 y.o. male, well-nourished, well-developed, no acute distress. Alert, oriented, cooperative. HENT:  Normocephalic, atraumatic. Pupils round and reactive. EOMs intact. NECK:  Supple, no bruits. CHEST:  Clear to anterior and posterior chest walls. No rhonchi, rales, wheezes. HEART:  Regular, rate and rhythm.  No murmurs.  S1 and S2 noted. ABDOMEN:  Soft, nontender, bowel sounds present. RECTAL/BREAST/GENITALIA:  Not done, not pertinent to present illness. EXTREMITIES:  Right hip flex 90, 0 IR, 10 ER, 10-20 Abduction. Antalgic gait.   Assessment/Plan: End stage arthritis, right hip  The patient is being admitted to [facility] to undergo a right total hip arthroplasty.  Surgery will be performed by Dr. Ollen Gross.  Risks and benefits have been discussed with the patient and they elect to proceed wth the procedure.

## 2011-03-29 ENCOUNTER — Encounter (HOSPITAL_COMMUNITY): Payer: Self-pay | Admitting: *Deleted

## 2011-03-29 ENCOUNTER — Inpatient Hospital Stay (HOSPITAL_COMMUNITY)
Admission: RE | Admit: 2011-03-29 | Discharge: 2011-04-01 | DRG: 470 | Disposition: A | Discharge status: Home Health | Payer: 59 | Source: Ambulatory Visit | Attending: Orthopedic Surgery | Admitting: Orthopedic Surgery

## 2011-03-29 ENCOUNTER — Encounter (HOSPITAL_COMMUNITY): Payer: Self-pay | Admitting: Anesthesiology

## 2011-03-29 ENCOUNTER — Inpatient Hospital Stay (HOSPITAL_COMMUNITY): Payer: 59 | Admitting: Anesthesiology

## 2011-03-29 ENCOUNTER — Encounter (HOSPITAL_COMMUNITY)
Admission: RE | Disposition: A | Discharge status: Home Health | Payer: Self-pay | Source: Ambulatory Visit | Attending: Orthopedic Surgery

## 2011-03-29 ENCOUNTER — Inpatient Hospital Stay (HOSPITAL_COMMUNITY): Payer: 59

## 2011-03-29 DIAGNOSIS — M1610 Unilateral primary osteoarthritis, unspecified hip: Principal | ICD-10-CM | POA: Diagnosis present

## 2011-03-29 DIAGNOSIS — I10 Essential (primary) hypertension: Secondary | ICD-10-CM | POA: Diagnosis present

## 2011-03-29 DIAGNOSIS — M169 Osteoarthritis of hip, unspecified: Secondary | ICD-10-CM | POA: Insufficient documentation

## 2011-03-29 DIAGNOSIS — E785 Hyperlipidemia, unspecified: Secondary | ICD-10-CM | POA: Diagnosis present

## 2011-03-29 DIAGNOSIS — E119 Type 2 diabetes mellitus without complications: Secondary | ICD-10-CM | POA: Diagnosis present

## 2011-03-29 HISTORY — DX: Alcohol dependence, in remission: F10.21

## 2011-03-29 HISTORY — DX: Other postprocedural complications and disorders of digestive system: K91.89

## 2011-03-29 HISTORY — DX: Other postprocedural complications and disorders of digestive system: K56.7

## 2011-03-29 HISTORY — PX: TOTAL HIP ARTHROPLASTY: SHX124

## 2011-03-29 HISTORY — DX: Personal history of other infectious and parasitic diseases: Z86.19

## 2011-03-29 LAB — TYPE AND SCREEN

## 2011-03-29 SURGERY — ARTHROPLASTY, HIP, TOTAL,POSTERIOR APPROACH
Anesthesia: Choice | Site: Hip | Laterality: Right | Wound class: Clean

## 2011-03-29 MED ORDER — GLIPIZIDE 10 MG PO TABS
10.0000 mg | ORAL_TABLET | ORAL | Status: DC
  Administered 2011-03-30 – 2011-04-01 (×3): 10 mg via ORAL
  Filled 2011-03-29 (×4): qty 1

## 2011-03-29 MED ORDER — DOCUSATE SODIUM 100 MG PO CAPS
100.0000 mg | ORAL_CAPSULE | Freq: Two times a day (BID) | ORAL | Status: DC
  Administered 2011-03-29 – 2011-04-01 (×6): 100 mg via ORAL
  Filled 2011-03-29 (×7): qty 1

## 2011-03-29 MED ORDER — HYDROMORPHONE HCL PF 1 MG/ML IJ SOLN
INTRAMUSCULAR | Status: AC
  Filled 2011-03-29: qty 1

## 2011-03-29 MED ORDER — SUCCINYLCHOLINE CHLORIDE 20 MG/ML IJ SOLN
INTRAMUSCULAR | Status: DC | PRN
  Administered 2011-03-29: 100 mg via INTRAVENOUS

## 2011-03-29 MED ORDER — ONDANSETRON HCL 4 MG/2ML IJ SOLN
INTRAMUSCULAR | Status: DC | PRN
  Administered 2011-03-29: 4 mg via INTRAVENOUS

## 2011-03-29 MED ORDER — METOCLOPRAMIDE HCL 5 MG/ML IJ SOLN: 5.0000 mg | Freq: Three times a day (TID) | INTRAMUSCULAR | Status: DC | PRN

## 2011-03-29 MED ORDER — ACETAMINOPHEN 10 MG/ML IV SOLN
1000.0000 mg | Freq: Four times a day (QID) | INTRAVENOUS | Status: AC
  Administered 2011-03-29 – 2011-03-30 (×4): 1000 mg via INTRAVENOUS
  Filled 2011-03-29 (×6): qty 100

## 2011-03-29 MED ORDER — ONDANSETRON HCL 4 MG PO TABS: 4.0000 mg | ORAL_TABLET | Freq: Four times a day (QID) | ORAL | Status: DC | PRN

## 2011-03-29 MED ORDER — DIPHENHYDRAMINE HCL 12.5 MG/5ML PO ELIX: 12.5000 mg | ORAL_SOLUTION | ORAL | Status: DC | PRN

## 2011-03-29 MED ORDER — ACETAMINOPHEN 650 MG RE SUPP: 650.0000 mg | Freq: Four times a day (QID) | RECTAL | Status: DC | PRN

## 2011-03-29 MED ORDER — ONDANSETRON HCL 4 MG/2ML IJ SOLN: 4.0000 mg | Freq: Four times a day (QID) | INTRAMUSCULAR | Status: DC | PRN

## 2011-03-29 MED ORDER — CEFAZOLIN SODIUM 1-5 GM-% IV SOLN
INTRAVENOUS | Status: DC | PRN
  Administered 2011-03-29: 2 g via INTRAVENOUS

## 2011-03-29 MED ORDER — ACETAMINOPHEN 325 MG PO TABS: 650.0000 mg | ORAL_TABLET | Freq: Four times a day (QID) | ORAL | Status: DC | PRN

## 2011-03-29 MED ORDER — HYDROMORPHONE HCL PF 1 MG/ML IJ SOLN
0.2500 mg | INTRAMUSCULAR | Status: DC | PRN
  Administered 2011-03-29 (×2): 0.5 mg via INTRAVENOUS
  Administered 2011-03-29: 0.25 mg via INTRAVENOUS
  Administered 2011-03-29: 0.5 mg via INTRAVENOUS
  Administered 2011-03-29: 0.3 mg via INTRAVENOUS

## 2011-03-29 MED ORDER — METHOCARBAMOL 100 MG/ML IJ SOLN
500.0000 mg | Freq: Four times a day (QID) | INTRAVENOUS | Status: DC | PRN
  Administered 2011-03-29: 500 mg via INTRAVENOUS
  Filled 2011-03-29: qty 5

## 2011-03-29 MED ORDER — FENTANYL CITRATE 0.05 MG/ML IJ SOLN
INTRAMUSCULAR | Status: DC | PRN
  Administered 2011-03-29: 50 ug via INTRAVENOUS
  Administered 2011-03-29 (×2): 100 ug via INTRAVENOUS
  Administered 2011-03-29 (×5): 50 ug via INTRAVENOUS

## 2011-03-29 MED ORDER — LACTATED RINGERS IV SOLN
INTRAVENOUS | Status: DC
  Administered 2011-03-29: 17:00:00 via INTRAVENOUS
  Administered 2011-03-29: 1000 mL via INTRAVENOUS

## 2011-03-29 MED ORDER — RIVAROXABAN 10 MG PO TABS
10.0000 mg | ORAL_TABLET | ORAL | Status: DC
  Administered 2011-03-30 – 2011-04-01 (×3): 10 mg via ORAL
  Filled 2011-03-29 (×3): qty 1

## 2011-03-29 MED ORDER — MENTHOL 3 MG MT LOZG
1.0000 | LOZENGE | OROMUCOSAL | Status: DC | PRN
  Filled 2011-03-29 (×2): qty 9

## 2011-03-29 MED ORDER — DEXTROSE 5 % IV SOLN: 1.0000 g | INTRAVENOUS | Status: DC | PRN

## 2011-03-29 MED ORDER — BUPIVACAINE LIPOSOME 1.3 % IJ SUSP
20.0000 mL | Freq: Once | INTRAMUSCULAR | Status: DC
  Filled 2011-03-29: qty 20

## 2011-03-29 MED ORDER — BISACODYL 10 MG RE SUPP: 10.0000 mg | Freq: Every day | RECTAL | Status: DC | PRN

## 2011-03-29 MED ORDER — MAGNESIUM HYDROXIDE 400 MG/5ML PO SUSP: 30.0000 mL | Freq: Two times a day (BID) | ORAL | Status: DC | PRN

## 2011-03-29 MED ORDER — POLYETHYLENE GLYCOL 3350 17 G PO PACK
17.0000 g | PACK | Freq: Every day | ORAL | Status: DC | PRN
  Filled 2011-03-29: qty 1

## 2011-03-29 MED ORDER — MIDAZOLAM HCL 5 MG/5ML IJ SOLN
INTRAMUSCULAR | Status: DC | PRN
  Administered 2011-03-29: 2 mg via INTRAVENOUS

## 2011-03-29 MED ORDER — METFORMIN HCL 500 MG PO TABS
1000.0000 mg | ORAL_TABLET | ORAL | Status: DC
  Filled 2011-03-29 (×2): qty 2

## 2011-03-29 MED ORDER — BUPIVACAINE LIPOSOME 1.3 % IJ SUSP
INTRAMUSCULAR | Status: DC | PRN
  Administered 2011-03-29: 20 mL

## 2011-03-29 MED ORDER — CEFAZOLIN SODIUM 1-5 GM-% IV SOLN: 2.0000 g | Freq: Once | INTRAVENOUS | Status: DC

## 2011-03-29 MED ORDER — METHOCARBAMOL 500 MG PO TABS
500.0000 mg | ORAL_TABLET | Freq: Four times a day (QID) | ORAL | Status: DC | PRN
  Administered 2011-03-30 – 2011-04-01 (×7): 500 mg via ORAL
  Filled 2011-03-29 (×7): qty 1

## 2011-03-29 MED ORDER — BISACODYL 5 MG PO TBEC: 10.0000 mg | DELAYED_RELEASE_TABLET | Freq: Every day | ORAL | Status: DC | PRN

## 2011-03-29 MED ORDER — PROMETHAZINE HCL 25 MG/ML IJ SOLN: 6.2500 mg | INTRAMUSCULAR | Status: DC | PRN

## 2011-03-29 MED ORDER — SODIUM CHLORIDE 0.9 % IV SOLN
INTRAVENOUS | Status: DC
  Administered 2011-03-29: 1000 mL via INTRAVENOUS
  Administered 2011-03-30 (×2): via INTRAVENOUS

## 2011-03-29 MED ORDER — METOCLOPRAMIDE HCL 10 MG PO TABS: 5.0000 mg | ORAL_TABLET | Freq: Three times a day (TID) | ORAL | Status: DC | PRN

## 2011-03-29 MED ORDER — PROPOFOL 10 MG/ML IV EMUL
INTRAVENOUS | Status: DC | PRN
  Administered 2011-03-29: 200 mg via INTRAVENOUS

## 2011-03-29 MED ORDER — MORPHINE SULFATE 2 MG/ML IJ SOLN
1.0000 mg | INTRAMUSCULAR | Status: DC | PRN
  Administered 2011-03-29: 1 mg via INTRAVENOUS
  Filled 2011-03-29: qty 1

## 2011-03-29 MED ORDER — ACETAMINOPHEN 10 MG/ML IV SOLN
INTRAVENOUS | Status: DC | PRN
  Administered 2011-03-29: 1000 mg via INTRAVENOUS

## 2011-03-29 MED ORDER — PHENOL 1.4 % MT LIQD
1.0000 | OROMUCOSAL | Status: DC | PRN
  Filled 2011-03-29: qty 177

## 2011-03-29 MED ORDER — CEFAZOLIN SODIUM 1-5 GM-% IV SOLN
1.0000 g | Freq: Four times a day (QID) | INTRAVENOUS | Status: AC
  Administered 2011-03-29 – 2011-03-30 (×3): 1 g via INTRAVENOUS
  Filled 2011-03-29 (×3): qty 50

## 2011-03-29 MED ORDER — FLEET ENEMA 7-19 GM/118ML RE ENEM: 1.0000 | ENEMA | Freq: Every day | RECTAL | Status: DC | PRN

## 2011-03-29 MED ORDER — TRAMADOL HCL 50 MG PO TABS: 50.0000 mg | ORAL_TABLET | Freq: Every evening | ORAL | Status: DC | PRN

## 2011-03-29 MED ORDER — OXYCODONE HCL 5 MG PO TABS
5.0000 mg | ORAL_TABLET | ORAL | Status: DC | PRN
Start: 2011-03-29 — End: 2011-04-01
  Administered 2011-03-29: 5 mg via ORAL
  Administered 2011-03-30 (×4): 10 mg via ORAL
  Administered 2011-03-30: 5 mg via ORAL
  Administered 2011-03-30 – 2011-04-01 (×9): 10 mg via ORAL
  Filled 2011-03-29 (×6): qty 2
  Filled 2011-03-29: qty 1
  Filled 2011-03-29 (×2): qty 2
  Filled 2011-03-29: qty 1
  Filled 2011-03-29 (×4): qty 2
  Filled 2011-03-29: qty 1
  Filled 2011-03-29: qty 2

## 2011-03-29 MED ORDER — INSULIN ASPART 100 UNIT/ML ~~LOC~~ SOLN
0.0000 [IU] | Freq: Three times a day (TID) | SUBCUTANEOUS | Status: DC
  Filled 2011-03-29: qty 3

## 2011-03-29 MED ORDER — TEMAZEPAM 15 MG PO CAPS: 15.0000 mg | ORAL_CAPSULE | Freq: Every evening | ORAL | Status: DC | PRN

## 2011-03-29 SURGICAL SUPPLY — 56 items
BAG SPEC THK2 15X12 ZIP CLS (MISCELLANEOUS) ×1
BAG ZIPLOCK 12X15 (MISCELLANEOUS) ×2 IMPLANT
BIT DRILL 2.8X128 (BIT) ×2 IMPLANT
BLADE EXTENDED COATED 6.5IN (ELECTRODE) ×2 IMPLANT
BLADE SAW SAG 73X25 THK (BLADE) ×1
BLADE SAW SGTL 73X25 THK (BLADE) ×1 IMPLANT
CATH KIT ON-Q SILVERSOAK 5 (CATHETERS) IMPLANT
CATH KIT ON-Q SILVERSOAK 5IN (CATHETERS) IMPLANT
CLOTH BEACON ORANGE TIMEOUT ST (SAFETY) ×2 IMPLANT
CUP ACETBLR 54 OD PINNACLE (Hips) ×1 IMPLANT
DRAPE INCISE IOBAN 66X45 STRL (DRAPES) ×2 IMPLANT
DRAPE ORTHO SPLIT 77X108 STRL (DRAPES) ×4
DRAPE POUCH INSTRU U-SHP 10X18 (DRAPES) ×2 IMPLANT
DRAPE SURG ORHT 6 SPLT 77X108 (DRAPES) ×2 IMPLANT
DRAPE U-SHAPE 47X51 STRL (DRAPES) ×2 IMPLANT
DRSG ADAPTIC 3X8 NADH LF (GAUZE/BANDAGES/DRESSINGS) ×2 IMPLANT
DRSG MEPILEX BORDER 4X4 (GAUZE/BANDAGES/DRESSINGS) ×3 IMPLANT
DRSG MEPILEX BORDER 4X8 (GAUZE/BANDAGES/DRESSINGS) ×1 IMPLANT
DRSG TEGADERM 2-3/8X2-3/4 SM (GAUZE/BANDAGES/DRESSINGS) ×1 IMPLANT
DURAPREP 26ML APPLICATOR (WOUND CARE) ×2 IMPLANT
ELECT REM PT RETURN 9FT ADLT (ELECTROSURGICAL) ×2
ELECTRODE REM PT RTRN 9FT ADLT (ELECTROSURGICAL) ×1 IMPLANT
ELIMINATOR HOLE APEX DEPUY (Hips) ×1 IMPLANT
EVACUATOR 1/8 PVC DRAIN (DRAIN) ×2 IMPLANT
FACESHIELD LNG OPTICON STERILE (SAFETY) ×8 IMPLANT
GLOVE BIO SURGEON STRL SZ7.5 (GLOVE) ×2 IMPLANT
GLOVE BIO SURGEON STRL SZ8 (GLOVE) ×2 IMPLANT
GLOVE BIOGEL PI IND STRL 8 (GLOVE) ×2 IMPLANT
GLOVE BIOGEL PI INDICATOR 8 (GLOVE) ×2
GLOVE SURG SS PI 6.5 STRL IVOR (GLOVE) ×4 IMPLANT
GOWN PREVENTION PLUS XLARGE (GOWN DISPOSABLE) ×2 IMPLANT
GOWN STRL NON-REIN LRG LVL3 (GOWN DISPOSABLE) ×2 IMPLANT
GOWN STRL REIN XL XLG (GOWN DISPOSABLE) ×2 IMPLANT
HEAD CERAMIC BIOLOX 36 +3 (Hips) ×1 IMPLANT
IMMOBILIZER KNEE 20 (SOFTGOODS) ×2
IMMOBILIZER KNEE 20 THIGH 36 (SOFTGOODS) IMPLANT
KIT BASIN OR (CUSTOM PROCEDURE TRAY) ×2 IMPLANT
LINER MARATHON NEUT +4X54X36 (Hips) ×1 IMPLANT
MANIFOLD NEPTUNE II (INSTRUMENTS) ×2 IMPLANT
NS IRRIG 1000ML POUR BTL (IV SOLUTION) ×2 IMPLANT
PACK TOTAL JOINT (CUSTOM PROCEDURE TRAY) ×2 IMPLANT
PASSER SUT SWANSON 36MM LOOP (INSTRUMENTS) ×2 IMPLANT
POSITIONER SURGICAL ARM (MISCELLANEOUS) ×2 IMPLANT
SLEEVE FEM PROX 20F SML (Hips) ×1 IMPLANT
SPONGE GAUZE 4X4 12PLY (GAUZE/BANDAGES/DRESSINGS) ×2 IMPLANT
SROM FEM STEM (Hips) ×1 IMPLANT
STRIP CLOSURE SKIN 1/2X4 (GAUZE/BANDAGES/DRESSINGS) ×4 IMPLANT
SUT ETHIBOND NAB CT1 #1 30IN (SUTURE) ×4 IMPLANT
SUT MNCRL AB 4-0 PS2 18 (SUTURE) ×2 IMPLANT
SUT VIC AB 1 CT1 27 (SUTURE) ×6
SUT VIC AB 1 CT1 27XBRD ANTBC (SUTURE) ×3 IMPLANT
SUT VIC AB 2-0 CT1 27 (SUTURE) ×6
SUT VIC AB 2-0 CT1 TAPERPNT 27 (SUTURE) ×3 IMPLANT
TOWEL OR 17X26 10 PK STRL BLUE (TOWEL DISPOSABLE) ×4 IMPLANT
TRAY FOLEY CATH 14FRSI W/METER (CATHETERS) ×2 IMPLANT
WATER STERILE IRR 1500ML POUR (IV SOLUTION) ×2 IMPLANT

## 2011-03-29 NOTE — Op Note (Signed)
Pre-operative diagnosis- Osteoarthritis Right hip  Post-operative diagnosis- Osteoarthritis  Right hip  Procedure-  RightTotal Hip Arthroplasty  Surgeon- Gus Rankin. Paysley Poplar, MD  Assistant- Avel Peace, PA-C   Anesthesia  General  EBL- 700 ml   Drain Hemovac   Complication- None  Condition- Stable to PACU  Brief Clinical Note-  Jonathan Calhoun is a 60 y.o. male with end stage arthritis of his right hip with progressively worsening pain and dysfunction. Pain occurs with activity and rest including pain at night. He has tried analgesics, protected weight bearing and rest without benefit. Pain is too severe to attempt physical therapy. Radiographs demonstrate bone on bone arthritis with subchondral cyst formation and massive osteophyte formation  Procedure in detail-   The patient is brought into the operating room and placed on the operating table. After successful administration of General   anesthesia, the patient is placed in the  Left lateral decubitus position with the  Right side up and held in place with the hip positioner. The lower extremity is isolated from the perineum with plastic drapes and time-out is performed by the surgical team. The lower extremity is then prepped and draped in the usual sterile fashion. A short posterolateral incision is made with a ten blade through the subcutaneous tissue to the level of the fascia lata which is incised in line with the skin incision. The sciatic nerve is palpated and protected and the short external rotators and capsule are isolated from the femur. The hip is then dislocated and the center of the femoral head is marked. A trial prosthesis is placed such that the trial head corresponds to the center of the patients' native femoral head. The resection level is marked on the femoral neck and the resection is made with an oscillating saw. The femoral head is removed and femoral retractors placed to gain access to the femoral canal.      The  canal finder is passed into the femoral canal and the canal is thoroughly irrigated with sterile saline to remove the fatty contents. Axial reaming is performed to 15.5  mm, proximal reaming to 20 F   and the sleeve machined to a small. A 19F small trial sleeve is placed into the proximal femur.      The femur is then retracted anteriorly to gain acetabular exposure. Acetabular retractors are placed and the labrum and osteophytes are removed, Acetabular reaming is performed to 53  mm and a 54  mm Pinnacle acetabular shell is placed in anatomic position with excellent purchase. Additional dome screws were not needed. An apex hole eliminator is placed and the permanent 36 mm neutral plus 4 Marathon liner is placed into the acetabular shell.      The trial femur is then placed into the femoral canal. The size is 20 x 15  stem with a 36 + 8  neck and a 36 +3 head with the neck version 10 degrees beyond  the patients' native anteversion. The hip is reduced with excellent stability with full extension and full external rotation, 70 degrees flexion with 40 degrees adduction and 90 degrees internal rotation and 90 degrees of flexion with 70 degrees of internal rotation. The operative leg is placed on top of the non-operative leg and the leg lengths are found to be equal. The trials are then removed and the permanent implant of the same size is impacted into the femoral canal. The ceramic femoral head of the same size as the trial is placed and  the hip is reduced with the same stability parameters. The operative leg is again placed on top of the non-operative leg and the leg lengths are found to be equal.      The wound is then copiously irrigated with saline solution and the capsule and short external rotators are re-attached to the femur through drill holes with Ethibond suture. The fascia lata is closed over a hemovac drain with #1 vicryl suture and the fascia lata, gluteal muscles and subcutaneous tissues are injected  with Exparel 20ml diluted with saline 50ml. The subcutaneous tissues are closed with #1 and2-0 vicryl and the subcuticular layer closed with running 4-0 Monocryl. The drain is hooked to suction, incision cleaned and dried, and steri-srips and a bulky sterile dressing applied. The limb is placed into a knee immobilizer and the patient is awakened and transported to recovery in stable condition.      Please note that a surgical assistant was a medical necessity for this procedure in order to perform it in a safe and expeditious manner. The assistant was necessary to provide retraction to the vital neurovascular structures and to retract and position the limb to allow for anatomic placement of the prosthetic components.  Gus Rankin Jonathan Argueta, MD    03/29/2011, 5:32 PM

## 2011-03-29 NOTE — Transfer of Care (Signed)
Immediate Anesthesia Transfer of Care Note  Patient: Jonathan Calhoun  Procedure(s) Performed:  TOTAL HIP ARTHROPLASTY  Patient Location: PACU  Anesthesia Type: General  Level of Consciousness: awake, alert , oriented and patient cooperative  Airway & Oxygen Therapy: Patient Spontanous Breathing and Patient connected to face mask oxygen  Post-op Assessment: Report given to PACU RN and Post -op Vital signs reviewed and stable  Post vital signs: Reviewed and stable  Complications: No apparent anesthesia complications

## 2011-03-29 NOTE — Interval H&P Note (Signed)
History and Physical Interval Note:   03/29/2011   3:40 PM   Jonathan Calhoun  has presented today for surgery, with the diagnosis of osteoarthritis right hip  The various methods of treatment have been discussed with the patient and family. After consideration of risks, benefits and other options for treatment, the patient has consented to  Procedure(s): TOTAL HIP ARTHROPLASTY as a surgical intervention .  The patients' history has been reviewed, patient examined, no change in status, stable for surgery.  I have reviewed the patients' chart and labs.  Questions were answered to the patient's satisfaction.     Loanne Drilling  MD   Pt examined. History and physical unchanged   Gus Rankin. Noya Santarelli, MD    03/29/2011, 3:40 PM

## 2011-03-29 NOTE — Preoperative (Signed)
Beta Blockers   Reason not to administer Beta Blockers:Not Applicable 

## 2011-03-29 NOTE — Anesthesia Postprocedure Evaluation (Signed)
  Anesthesia Post-op Note  Patient: Jonathan Calhoun  Procedure(s) Performed:  TOTAL HIP ARTHROPLASTY  Patient Location: PACU  Anesthesia Type: General  Level of Consciousness: awake and alert   Airway and Oxygen Therapy: Patient Spontanous Breathing  Post-op Pain: mild  Post-op Assessment: Post-op Vital signs reviewed, Patient's Cardiovascular Status Stable, Respiratory Function Stable, Patent Airway and No signs of Nausea or vomiting  Post-op Vital Signs: stable  Complications: No apparent anesthesia complications

## 2011-03-29 NOTE — Anesthesia Preprocedure Evaluation (Signed)
Anesthesia Evaluation  Patient identified by MRN, date of birth, ID band Patient awake    Reviewed: Allergy & Precautions, H&P , NPO status , Patient's Chart, lab work & pertinent test results  Airway Mallampati: III TM Distance: >3 FB Neck ROM: Full    Dental No notable dental hx.    Pulmonary  clear to auscultation  Pulmonary exam normal       Cardiovascular Regular Normal    Neuro/Psych Negative Neurological ROS  Negative Psych ROS   GI/Hepatic negative GI ROS, Neg liver ROS,   Endo/Other    Renal/GU negative Renal ROS  Genitourinary negative   Musculoskeletal negative musculoskeletal ROS (+)   Abdominal   Peds negative pediatric ROS (+)  Hematology negative hematology ROS (+)   Anesthesia Other Findings   Reproductive/Obstetrics negative OB ROS                           Anesthesia Physical Anesthesia Plan  ASA: III  Anesthesia Plan: General   Post-op Pain Management:    Induction: Intravenous  Airway Management Planned: Oral ETT  Additional Equipment:   Intra-op Plan:   Post-operative Plan: Extubation in OR  Informed Consent: I have reviewed the patients History and Physical, chart, labs and discussed the procedure including the risks, benefits and alternatives for the proposed anesthesia with the patient or authorized representative who has indicated his/her understanding and acceptance.   Dental advisory given  Plan Discussed with: CRNA  Anesthesia Plan Comments:         Anesthesia Quick Evaluation

## 2011-03-30 DIAGNOSIS — M169 Osteoarthritis of hip, unspecified: Secondary | ICD-10-CM | POA: Insufficient documentation

## 2011-03-30 LAB — GLUCOSE, CAPILLARY
Glucose-Capillary: 168 mg/dL — ABNORMAL HIGH (ref 70–99)
Glucose-Capillary: 168 mg/dL — ABNORMAL HIGH (ref 70–99)

## 2011-03-30 LAB — CBC
HCT: 36.1 % — ABNORMAL LOW (ref 39.0–52.0)
Hemoglobin: 12.4 g/dL — ABNORMAL LOW (ref 13.0–17.0)
MCH: 30.6 pg (ref 26.0–34.0)
MCV: 89.1 fL (ref 78.0–100.0)
RBC: 4.05 MIL/uL — ABNORMAL LOW (ref 4.22–5.81)

## 2011-03-30 LAB — BASIC METABOLIC PANEL
CO2: 27 mEq/L (ref 19–32)
Calcium: 8.6 mg/dL (ref 8.4–10.5)
Chloride: 98 mEq/L (ref 96–112)
Creatinine, Ser: 0.59 mg/dL (ref 0.50–1.35)
Glucose, Bld: 182 mg/dL — ABNORMAL HIGH (ref 70–99)

## 2011-03-30 MED ORDER — METFORMIN HCL 500 MG PO TABS
1000.0000 mg | ORAL_TABLET | ORAL | Status: DC
  Administered 2011-04-01: 1000 mg via ORAL
  Filled 2011-03-30 (×2): qty 2

## 2011-03-30 MED ORDER — INSULIN ASPART 100 UNIT/ML ~~LOC~~ SOLN
0.0000 [IU] | Freq: Three times a day (TID) | SUBCUTANEOUS | Status: DC
  Administered 2011-03-30: 3 [IU] via SUBCUTANEOUS
  Administered 2011-03-30 – 2011-03-31 (×2): 2 [IU] via SUBCUTANEOUS
  Administered 2011-03-31: 3 [IU] via SUBCUTANEOUS
  Administered 2011-03-31: 2 [IU] via SUBCUTANEOUS
  Administered 2011-04-01: 3 [IU] via SUBCUTANEOUS
  Filled 2011-03-30: qty 3

## 2011-03-30 NOTE — Progress Notes (Signed)
Subjective: 1 Day Post-Op Procedure(s) (LRB): TOTAL HIP ARTHROPLASTY (Right) Patient reports pain as mild in buttock area otherwise doing great for day one. Patient seen in rounds with Dr. Lequita Halt. Patient has complaints of just buttock pain.  Hip doing well  Objective: Vital signs in last 24 hours: Temp:  [97.3 F (36.3 C)-98.5 F (36.9 C)] 98.5 F (36.9 C) (11/08 0512) Pulse Rate:  [69-101] 86  (11/08 0512) Resp:  [9-26] 20  (11/08 0512) BP: (125-180)/(76-112) 125/76 mmHg (11/08 0512) SpO2:  [98 %-100 %] 98 % (11/08 0512) Weight:  [104.327 kg (230 lb)] 230 lb (104.327 kg) (11/07 2100)  Intake/Output from previous day:  Intake/Output Summary (Last 24 hours) at 03/30/11 0711 Last data filed at 03/30/11 0600  Gross per 24 hour  Intake   3125 ml  Output   4275 ml  Net  -1150 ml    Intake/Output this shift:     Basename 03/30/11 0431  HGB 12.4*    Basename 03/30/11 0431  WBC 8.4  RBC 4.05*  HCT 36.1*  PLT 151    Basename 03/30/11 0431  NA 134*  K 3.7  CL 98  CO2 27  BUN 7  CREATININE 0.59  GLUCOSE 182*  CALCIUM 8.6   No results found for this basename: LABPT:2,INR:2 in the last 72 hours  Exam - Neurovascular intact Sensation intact distally Intact pulses distally Dressing - clean, dry Motor function intact - moving foot and toes well on exam.  Hemovac pulled without difficulty.  Assessment/Plan: 1 Day Post-Op Procedure(s) (LRB): TOTAL HIP ARTHROPLASTY (Right)  Advance diet Up with therapy Discharge home with home health when met goals Past Medical History  Diagnosis Date  . Allergy   . Hyperlipidemia   . Hypertension   . Diabetes mellitus     ORAL MEDS-NO INSULIN  . History of alcoholism   . History of shingles   . Ileus, postoperative     Post-op Chole Surgery    DVT Prophylaxis - Xarelto Protocol Partial-Weight Bearing 25-50% Right Leg HV pulled.  DC Immobilizer   Vedanth Sirico 03/30/2011, 7:11 AM

## 2011-03-30 NOTE — Progress Notes (Signed)
Physical Therapy Evaluation Patient Details Name: Jonathan Calhoun MRN: [id number] DOB: [birth year] Today's Date: 03/30/2011 0835 - 0915  Eval, TE Problem List:  Patient Active Problem List  Diagnoses  . POSTHERPETIC NEURALGIA  . HERPES ZOSTER  . DIABETES MELLITUS, TYPE II  . AUTONOMIC NEUROPATHY, DIABETIC  . HYPERLIPIDEMIA  . ERECTILE DYSFUNCTION  . HYPERTENSION  . ALLERGIC RHINITIS  . EXTRINSIC ASTHMA, UNSPECIFIED  . CELLULITIS, FOOT, RIGHT  . DISC DISEASE, LUMBAR    Past Medical History:  Past Medical History  Diagnosis Date  . Allergy   . Hyperlipidemia   . Hypertension   . Diabetes mellitus     ORAL MEDS-NO INSULIN  . History of alcoholism   . History of shingles   . Ileus, postoperative     Post-op Chole Surgery   Past Surgical History:  Past Surgical History  Procedure Date  . Cardiac catheterization 2002  . Cholecystectomy   . Right shoulder surgery for tear and spur - yrs ago     PT Assessment/Plan/Recommendation PT Assessment Clinical Impression Statement: Pt with post THR 03/29/11 presents with decreased strength and functional mobility.  He would benefit from skilled PT intervention to maximize I for dc home with spouse PT Recommendation/Assessment: Patient will need skilled PT in the acute care venue PT Problem List: Decreased strength;Decreased range of motion;Decreased activity tolerance;Decreased mobility;Decreased knowledge of use of DME;Decreased knowledge of precautions PT Therapy Diagnosis : Difficulty walking PT Plan PT Frequency: 7X/week PT Treatment/Interventions: DME instruction;Gait training;Stair training;Functional mobility training;Therapeutic exercise;Patient/family education PT Recommendation Recommendations for Other Services: OT consult Follow Up Recommendations: Home health PT Equipment Recommended:  (borrowed SW - spouse to bring in for assess and fit) PT Goals  Acute Rehab PT Goals PT Goal Formulation: With patient Time For  Goal Achievement: 7 days Pt will go Supine/Side to Sit: with supervision PT Goal: Supine/Side to Sit - Progress: Not met Pt will go Sit to Supine/Side: with supervision PT Goal: Sit to Supine/Side - Progress: Not met Pt will Transfer Sit to Stand/Stand to Sit: with supervision PT Transfer Goal: Sit to Stand/Stand to Sit - Progress: Not met Pt will Ambulate: 51 - 150 feet PT Goal: Ambulate - Progress: Not met Pt will Go Up / Down Stairs: 1-2 stairs PT Goal: Up/Down Stairs - Progress: Not met  PT Evaluation Precautions/Restrictions  Precautions Precautions: Posterior Hip Precaution Booklet Issued: No Restrictions Weight Bearing Restrictions: Yes RUE Weight Bearing: Partial weight bearing RUE Partial Weight Bearing Percentage or Pounds: 25-50% Other Position/Activity Restrictions: THP Prior Functioning  Home Living Lives With: Spouse Receives Help From: Family Type of Home: House Home Layout: Able to live on main level with bedroom/bathroom Prior Function Level of Independence: Independent with basic ADLs;Independent with gait;Independent with transfers Able to Take Stairs?: Yes Cognition Cognition Arousal/Alertness: Awake/alert Overall Cognitive Status: Appears within functional limits for tasks assessed Orientation Level: Oriented X4 Sensation/Coordination Coordination Gross Motor Movements are Fluid and Coordinated: Yes Fine Motor Movements are Fluid and Coordinated: Yes Extremity Assessment RUE Assessment RUE Assessment: Within Functional Limits LUE Assessment LUE Assessment: Within Functional Limits RLE Assessment RLE Assessment: Within Functional Limits (following THP) LLE Assessment LLE Assessment: Within Functional Limits Mobility (including Balance) Bed Mobility Bed Mobility: Yes Supine to Sit: 3: Mod assist Transfers Transfers: Yes Sit to Stand: 3: Mod assist Sit to Stand Details (indicate cue type and reason): cues for use of UEs, LE position and  THP Stand to Sit: 3: Mod assist Stand to Sit Details: cues for use of  UEs, and R LE fwd Ambulation/Gait Ambulation/Gait: Yes Ambulation/Gait Assistance: 3: Mod assist Ambulation/Gait Assistance Details (indicate cue type and reason): cues for sequence, posture, position from RW Ambulation Distance (Feet): 20 Feet Assistive device: Rolling walker Gait Pattern: Step-to pattern    Exercise  Total Joint Exercises Ankle Circles/Pumps: AROM;Both;10 reps;Other reps (comment);Supine Quad Sets: AROM;10 reps;Supine Heel Slides: AAROM;Right;10 reps;Supine Hip ABduction/ADduction: AAROM;Right;10 reps;Supine End of Session PT - End of Session Equipment Utilized During Treatment: Gait belt Activity Tolerance: Patient tolerated treatment well Patient left: in chair;with call bell in reach Nurse Communication: Mobility status for transfers;Mobility status for ambulation General Behavior During Session: [facility] for tasks performed Cognition: [facility] for tasks performed  Jonathan Calhoun 03/30/2011, 12:01 PM

## 2011-03-30 NOTE — Progress Notes (Signed)
Physical Therapy Treatment Patient Details Name: Jonathan Calhoun MRN: [id number] DOB: 26-Dec-1950 Today's Date: 03/30/2011 1518 - 1543 GTx2 PT Assessment/Plan  PT - Assessment/Plan PT Plan: Discharge plan remains appropriate PT Frequency: 7X/week Follow Up Recommendations: Home health PT PT Goals  Acute Rehab PT Goals PT Goal: Supine/Side to Sit - Progress: Partly met PT Goal: Sit to Supine/Side - Progress: Partly met PT Transfer Goal: Sit to Stand/Stand to Sit - Progress: Partly met PT Goal: Ambulate - Progress: Partly met  PT Treatment Precautions/Restrictions  Precautions Precautions: Posterior Hip Precaution Booklet Issued: Yes (comment) Precaution Comments: Sign hung in room Restrictions Weight Bearing Restrictions: Yes RUE Weight Bearing: Partial weight bearing RUE Partial Weight Bearing Percentage or Pounds: 25-50% Other Position/Activity Restrictions: Post THP Mobility (including Balance) Bed Mobility Bed Mobility: Yes Supine to Sit: 4: Min assist;3: Mod assist Supine to Sit Details (indicate cue type and reason): Pt utilized rail to assist Transfers Transfers: Yes Sit to Stand: 4: Min assist;3: Mod assist Sit to Stand Details (indicate cue type and reason): cues for LE position, use of UEs, and THP Stand to Sit: 4: Min assist Stand to Sit Details: cues for R LE fwd and use of UEs Ambulation/Gait Ambulation/Gait: Yes Ambulation/Gait Assistance: 4: Min assist Ambulation/Gait Assistance Details (indicate cue type and reason):  (cues for sequence, posture and positiion from RW) Ambulation Distance (Feet): 66 Feet Assistive device: Rolling walker Gait Pattern: Step-to pattern    Exercise    End of Session PT - End of Session Activity Tolerance: Patient tolerated treatment well Patient left: in chair Nurse Communication: Other (comment) (Pt requesting throat lozenges) General Behavior During Session: [facility] for tasks performed Cognition: [facility] for tasks  performed  Unnamed Zeien 03/30/2011, 3:52 PM

## 2011-03-31 LAB — CBC
HCT: 32.5 % — ABNORMAL LOW (ref 39.0–52.0)
Hemoglobin: 11.6 g/dL — ABNORMAL LOW (ref 13.0–17.0)
MCH: 31.5 pg (ref 26.0–34.0)
MCHC: 35.7 g/dL (ref 30.0–36.0)
RBC: 3.68 MIL/uL — ABNORMAL LOW (ref 4.22–5.81)

## 2011-03-31 LAB — BASIC METABOLIC PANEL
BUN: 6 mg/dL (ref 6–23)
CO2: 29 mEq/L (ref 19–32)
Calcium: 8.7 mg/dL (ref 8.4–10.5)
Glucose, Bld: 156 mg/dL — ABNORMAL HIGH (ref 70–99)
Sodium: 130 mEq/L — ABNORMAL LOW (ref 135–145)

## 2011-03-31 LAB — GLUCOSE, CAPILLARY
Glucose-Capillary: 168 mg/dL — ABNORMAL HIGH (ref 70–99)
Glucose-Capillary: 132 mg/dL — ABNORMAL HIGH (ref 70–99)
Glucose-Capillary: 196 mg/dL — ABNORMAL HIGH (ref 70–99)

## 2011-03-31 MED ORDER — METHOCARBAMOL 500 MG PO TABS: 500.0000 mg | ORAL_TABLET | Freq: Four times a day (QID) | ORAL | Status: AC | PRN

## 2011-03-31 MED ORDER — OXYCODONE HCL 5 MG PO TABS: 5.0000 mg | ORAL_TABLET | ORAL | Status: AC | PRN

## 2011-03-31 MED ORDER — RIVAROXABAN 10 MG PO TABS: 10.0000 mg | ORAL_TABLET | ORAL | Status: DC

## 2011-03-31 NOTE — Discharge Summary (Signed)
Physician Discharge Summary   Patient ID: Jonathan Calhoun MRN: [id number] DOB/AGE: April 17, 1951 60 y.o.  Admit date: 03/29/2011 Discharge date: 04/01/2011  Primary Diagnosis:End stage arthritis, right hip   Admission Diagnoses: End stage arthritis Hypertension  Diabetes mellitus  ORAL MEDS-NO INSULIN  History of alcoholism  History of shingles  Ileus, postoperative  Post-op Chole Surgery    Discharge Diagnoses:  Principal Problem:  *Osteoarthritis of right hip   Procedure: Procedure(s) (LRB): TOTAL HIP ARTHROPLASTY (Right)   Consults: none  HPI:  Patient is a 60 y.o. male who has been seen by Dr. Lequita Halt for ongoing hip pain. They have been followed and found to have continued progressive pain. They have been treated conservatively in the past including medications. Despite conservative measures, they continue to have pain. 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 It is felt that they would benefit from undergoing surgical intervention. Risks and benefits have been discussed with the patient and they elect to proceed with surgery. The patient has no contraindications to the upcoming procedure such as ongoing infection or progressive neurological disease.     Laboratory Data: [facility]  03/31/11 0430  03/30/11 0431   HGB  11.6*  12.4*     Basename  03/31/11 0430  03/30/11 0431   WBC  10.8*  8.4   RBC  3.68*  4.05*   HCT  32.5*  36.1*   PLT  139*  151     Basename  03/31/11 0430  03/30/11 0431   NA  130*  134*   K  4.5  3.7   CL  97  98   CO2  29  27   BUN  6  7   CREATININE  0.71  0.59   GLUCOSE  156*  182*   CALCIUM  8.7  8.6       X-Rays:Dg Chest 2 View  03/21/2011  *RADIOLOGY REPORT*  Clinical Data: Osteoarthritis right hip.  Preop.  Hypertension, diabetes.  CHEST - 2 VIEW  Comparison: 01/16/2006  Findings: Heart and mediastinal contours are within normal limits. No focal opacities or effusions.  No acute bony  abnormality.  IMPRESSION: No active cardiopulmonary disease.  Original Report Authenticated By: Cyndie Chime, M.D.   Dg Pelvis 1-2 Views Portable  03/29/2011  *RADIOLOGY REPORT*  Clinical Data: Postop right hip arthroplasty  PELVIS - 1-2 VIEW  Comparison: 01/16/2006  Findings: Right total hip arthroplasty is anatomically aligned.  No breakage or loosening of the hardware.  IMPRESSION: Right total arthroplasty anatomically aligned.  Original Report Authenticated By: Donavan Burnet, M.D.   Dg Hip Complete Right  03/21/2011  *RADIOLOGY REPORT*  Clinical Data: Preop right hip replacement.  Right hip pain.  RIGHT HIP - COMPLETE 2+ VIEW  Comparison: None.  Findings: Advanced degenerative changes in the right hip with near complete joint space loss and exuberant osteophyte formation.  Mild degenerative changes in the left hip. No acute bony abnormality. Specifically, no fracture, subluxation, or dislocation.  Soft tissues are intact.  IMPRESSION: Advanced degenerative changes in the right hip.  Mild degenerative changes in the left hip.  No acute findings.  Original Report Authenticated By: Cyndie Chime, M.D.    EKG: Orders placed in visit on 02/14/11  . EKG 12-LEAD     Hospital Course:Patient was admitted to [facility] and taken to the OR and underwent the above state procedure without complications.  Patient tolerated the procedure well and  was later transferred to the recovery room and then to the orthopaedic floor for postoperative care.  They were given PO and IV analgesics for pain control following their surgery.  They were given 24 hours of postoperative antibiotics and started on DVT prophylaxis.   PT and OT were ordered for total joint protocol.  Discharge planning consulted to help with postop disposition and equipment needs.  Patient had a tough night on the evening of surgery and started to get up with therapy on day one.  PCA was discontinued and they were weaned over to PO meds.   Hemovac drain was pulled without difficulty.  Continued to progress with therapy into day two.  Dressing was changed on day two and the incision was healing well. Patient was fatigued from his therapy and needed one more day.  It was decided to keep him Friday and set up his discharge for the weekend on Saturday.  Plan to release when met all goals with PT.   Discharge Medications: Oxy IR, Robaxin, Xarelto Continue - Docusate, Glipizide, Insulin, Metformin.   Diet: Diabetic  Activity:WBAT follow up home health  Follow-up:in 2 weeks  Disposition: Home  Discharged Condition: good     Signed: Gilmer Kaminsky 03/31/2011, 5:20 PM

## 2011-03-31 NOTE — Progress Notes (Signed)
Subjective: 2 Days Post-Op Procedure(s) (LRB): TOTAL HIP ARTHROPLASTY (Right) Patient reports pain as moderate.   Patient has complaints of pain post therapy yesterday.  Objective: Vital signs in last 24 hours: Temp:  [98.4 F (36.9 C)-99.6 F (37.6 C)] 99.6 F (37.6 C) (11/09 0545) Pulse Rate:  [91-107] 104  (11/09 0545) Resp:  [12-16] 12  (11/09 0545) BP: (105-132)/(67-76) 108/68 mmHg (11/09 0545) SpO2:  [93 %-97 %] 93 % (11/09 0545)  Intake/Output from previous day:  Intake/Output Summary (Last 24 hours) at 03/31/11 1116 Last data filed at 03/31/11 0740  Gross per 24 hour  Intake   1780 ml  Output   6450 ml  Net  -4670 ml    Intake/Output this shift: Total I/O In: 240 [P.O.:240] Out: -    Basename 03/31/11 0430 03/30/11 0431  HGB 11.6* 12.4*    Basename 03/31/11 0430 03/30/11 0431  WBC 10.8* 8.4  RBC 3.68* 4.05*  HCT 32.5* 36.1*  PLT 139* 151    Basename 03/31/11 0430 03/30/11 0431  NA 130* 134*  K 4.5 3.7  CL 97 98  CO2 29 27  BUN 6 7  CREATININE 0.71 0.59  GLUCOSE 156* 182*  CALCIUM 8.7 8.6   No results found for this basename: LABPT:2,INR:2 in the last 72 hours  Exam - Neurovascular intact Incision: no drainage No cellulitis present Compartment soft Dressing/Incision - clean, dry, no drainage Motor function intact - moving foot and toes well on exam.   Assessment/Plan: 2 Days Post-Op Procedure(s) (LRB): TOTAL HIP ARTHROPLASTY (Right)  Advance diet Up with therapy D/C IV fluids Plan for discharge tomorrow  Past Medical History  Diagnosis Date  . Allergy   . Hyperlipidemia   . Hypertension   . Diabetes mellitus     ORAL MEDS-NO INSULIN  . History of alcoholism   . History of shingles   . Ileus, postoperative     Post-op Chole Surgery    DVT Prophylaxis - Xarelto Protocol Partial-Weight Bearing 25-50% right Leg  Lovelace Cerveny V 03/31/2011, 11:16 AM

## 2011-03-31 NOTE — Progress Notes (Signed)
Physical Therapy Treatment Patient Details Name: Jonathan Calhoun MRN: [id number] DOB: 12-Jun-1950 Today's Date: 03/31/2011 1025 - 1105  Gt, TA, TE PT Assessment/Plan  PT - Assessment/Plan Comments on Treatment Session: Pt limited by fatigue and pain - muscle relaxer requested from RN PT Plan: Discharge plan remains appropriate PT Frequency: 7X/week Follow Up Recommendations: Home health PT Equipment Recommended: None recommended by PT PT Goals  Acute Rehab PT Goals PT Goal: Supine/Side to Sit - Progress: Not met PT Goal: Sit to Supine/Side - Progress: Not met PT Transfer Goal: Sit to Stand/Stand to Sit - Progress: Not met PT Goal: Ambulate - Progress: Not met PT Goal: Up/Down Stairs - Progress: Not met  PT Treatment Precautions/Restrictions  Precautions Precautions: Posterior Hip Precaution Booklet Issued: Yes (comment) Precaution Comments: Sign hung in room Restrictions Weight Bearing Restrictions: Yes RUE Weight Bearing: Partial weight bearing RUE Partial Weight Bearing Percentage or Pounds: 25-50% Other Position/Activity Restrictions: Post THP Mobility (including Balance) Bed Mobility Bed Mobility: Yes Supine to Sit: 1: +2 Total assist Transfers Sit to Stand: 1: +2 Total assist Sit to Stand Details (indicate cue type and reason): attempted from height of planned bed at home - pt 60% with +2 assist Stand to Sit: 4: Min assist Stand to Sit Details: cues for R LE fwd and use of UEs Ambulation/Gait Ambulation/Gait Assistance: 4: Min assist Ambulation/Gait Assistance Details (indicate cue type and reason): posture, position from RW Ambulation Distance (Feet): 26 Feet Assistive device: Rolling walker;4-wheeled walker Gait Pattern: Step-to pattern    Exercise  Total Joint Exercises Ankle Circles/Pumps: AROM;20 reps;Supine Gluteal Sets: AROM;10 reps;Supine Short Arc Quad: AROM;AAROM;Right;10 reps;Supine Heel Slides: AAROM;20 reps;Supine Hip ABduction/ADduction: AAROM;20  reps;Supine End of Session PT - End of Session Activity Tolerance: Patient limited by fatigue;Patient limited by pain Patient left: in chair;with call bell in reach Nurse Communication: Other (comment) General Behavior During Session: [facility] for tasks performed Cognition: [facility] for tasks performed  Jonathan Calhoun 03/31/2011, 12:43 PM

## 2011-04-01 LAB — CBC
HCT: 30.7 % — ABNORMAL LOW (ref 39.0–52.0)
Platelets: 143 10*3/uL — ABNORMAL LOW (ref 150–400)
RBC: 3.48 MIL/uL — ABNORMAL LOW (ref 4.22–5.81)
RDW: 13.5 % (ref 11.5–15.5)
WBC: 9.8 10*3/uL (ref 4.0–10.5)

## 2011-04-01 LAB — BASIC METABOLIC PANEL
CO2: 29 mEq/L (ref 19–32)
Chloride: 96 mEq/L (ref 96–112)
GFR calc Af Amer: >90 mL/min (ref >90)
Potassium: 3.7 mEq/L (ref 3.5–5.1)

## 2011-04-01 NOTE — Progress Notes (Signed)
Patient stable; discharged home with spouse.  Transported via wheelchair to private vehicle by NT.

## 2011-04-01 NOTE — Progress Notes (Signed)
HILLIARD BORGES  MRN: [id number] DOB/Age: [birth year] 60 y.o. Physician: Jacquelyne Balint Procedure: Procedure(s) (LRB): TOTAL HIP ARTHROPLASTY (Right)     Subjective: Doing great. Has met DC goals.  Vital Signs Temp:  [98.7 F (37.1 C)-100.1 F (37.8 C)] 98.8 F (37.1 C) (11/10 0529) Pulse Rate:  [98-103] 103  (11/10 0529) Resp:  [16-18] 18  (11/10 0529) BP: (100-114)/(67-70) 110/70 mmHg (11/10 0529) SpO2:  [94 %-97 %] 94 % (11/10 0529)  Lab Results  Basename 04/01/11 0430 03/31/11 0430  WBC 9.8 10.8*  HGB 10.6* 11.6*  HCT 30.7* 32.5*  PLT 143* 139*   BMET  Basename 04/01/11 0430 03/31/11 0430  NA 132* 130*  K 3.7 4.5  CL 96 97  CO2 29 29  GLUCOSE 152* 156*  BUN 7 6  CREATININE 0.60 0.71  CALCIUM 8.9 8.7   INR  Date Value Range Status  03/21/2011 0.97  0.00-1.49 (no units) Final     Exam NVI to Right Leg   Plan DC home. See DC summary.  Erik Nessel for Dr.Kevin Supple 04/01/2011, 9:48 AM

## 2011-04-01 NOTE — Progress Notes (Signed)
Occupational Therapy Evaluation Patient Details Name: Jonathan Calhoun MRN: [id number] DOB: Sep 20, 1950 Today's Date: 04/01/2011 Time in: 9:13 am Time out: 9:50 am  Problem List:  Patient Active Problem List  Diagnoses  . POSTHERPETIC NEURALGIA  . HERPES ZOSTER  . DIABETES MELLITUS, TYPE II  . AUTONOMIC NEUROPATHY, DIABETIC  . HYPERLIPIDEMIA  . ERECTILE DYSFUNCTION  . HYPERTENSION  . ALLERGIC RHINITIS  . EXTRINSIC ASTHMA, UNSPECIFIED  . CELLULITIS, FOOT, RIGHT  . DISC DISEASE, LUMBAR  . Osteoarthritis of right hip    Past Medical History:  Past Medical History  Diagnosis Date  . Allergy   . Hyperlipidemia   . Hypertension   . Diabetes mellitus     ORAL MEDS-NO INSULIN  . History of alcoholism   . History of shingles   . Ileus, postoperative     Post-op Chole Surgery   Past Surgical History:  Past Surgical History  Procedure Date  . Cardiac catheterization 2002  . Cholecystectomy   . Right shoulder surgery for tear and spur - yrs ago     OT Assessment/Plan/Recommendation OT Assessment Clinical Impression Statement: Patient is discharging today. Will benefit from [facility] OT Recommendation/Assessment: All further OT needs can be met in the next venue of care OT Problem List: Decreased strength;Decreased knowledge of use of DME or AE;Decreased knowledge of precautions;Pain OT Therapy Diagnosis : Generalized weakness;Acute pain OT Recommendation Follow Up Recommendations: Home health OT Equipment Recommended: None recommended by OT Individuals Consulted Consulted and Agree with Results and Recommendations: Patient OT Goals    OT Evaluation Precautions/Restrictions  Precautions Precautions: Posterior Hip Precaution Booklet Issued: Yes (comment) Precaution Comments: Sign hung in room Restrictions Weight Bearing Restrictions: Yes RLE Weight Bearing: Partial weight bearing RLE Partial Weight Bearing Percentage or Pounds: 25-50% Other Position/Activity  Restrictions: posterior THP Prior Functioning Home Living Bathroom Shower/Tub: Walk-in shower Bathroom Toilet: Standard Home Adaptive Equipment: Bedside commode/3-in-1;Built-in shower seat Prior Function Level of Independence: Independent with basic ADLs ADL ADL Eating/Feeding: Simulated;Independent Where Assessed - Eating/Feeding: Edge of bed Grooming: Simulated;Wash/dry face;Set up Where Assessed - Grooming: Sitting, bed;Unsupported Upper Body Bathing: Performed;Chest;Right arm;Left arm;Abdomen;Set up;Supervision/safety Where Assessed - Upper Body Bathing: Sitting, bed;Unsupported Lower Body Bathing: Performed;Moderate assistance Lower Body Bathing Details (indicate cue type and reason): assisted patient with lower body bathing. Demonstrated how patient can increase his independence with the long handled sponge as he has hip precautions. Where Assessed - Lower Body Bathing: Sit to stand from bed Upper Body Dressing: Performed;Set up Where Assessed - Upper Body Dressing: Sitting, bed;Unsupported Lower Body Dressing: Performed;Moderate assistance Lower Body Dressing Details (indicate cue type and reason): patient able to use the reacher to don  underwear and pants. Attempted to use sock aid but pt requires wide sock aid. unable to use regular sock aid. Therefore needed assist to don socks. Also needs min assist standing balance to pull up pants. Where Assessed - Lower Body Dressing: Sit to stand from bed Toilet Transfer: Simulated;Minimal assistance Toilet Transfer Method: Proofreader:  (with recliner chair.) Toileting - Clothing Manipulation: Simulated;Minimal assistance Where Assessed - Toileting Clothing Manipulation: Sit to stand from 3-in-1 or toilet (sit to stand from EOB to simulate) Toileting - Hygiene: Minimal assistance Where Assessed - Toileting Hygiene: Sit to stand from 3-in-1 or toilet (from EOB simulated) Tub/Shower Transfer: Not  assessed Tub/Shower Transfer Method: Not assessed Equipment Used: Long-handled shoe horn;Long-handled sponge;Reacher;Rolling walker;Sock aid Vision/Perception    Cognition Cognition Arousal/Alertness: Awake/alert Overall Cognitive Status: Appears within functional limits for tasks assessed  Orientation Level: Oriented X4 Sensation/Coordination   Extremity Assessment RUE Assessment RUE Assessment: Within Functional Limits LUE Assessment LUE Assessment: Within Functional Limits Mobility  Transfers Sit to Stand: 4: Min assist Sit to Stand Details (indicate cue type and reason): min verbal cues for THPs Stand to Sit: 4: Min assist Stand to Sit Details: min verbal cues for THPs Exercises   End of Session OT - End of Session Equipment Utilized During Treatment:  (rolling walker) Activity Tolerance: Patient tolerated treatment well (patient with some fatigue after session) Patient left: in chair;with call bell in reach General Behavior During Session: [facility] for tasks performed Cognition: [facility] for tasks performed   Lennox Laity [phone] pager 04/01/2011, 12:44 PM

## 2011-04-01 NOTE — Plan of Care (Signed)
Problem: Phase III Progression Outcomes Goal: Anticoagulant follow-up in place Outcome: Adequate for Discharge Patient being discharged home on Xarelto.

## 2011-04-01 NOTE — Progress Notes (Signed)
Physical Therapy Treatment Patient Details Name: Jonathan Calhoun MRN: [id number] DOB: May 25, 1950 Today's Date: 04/01/2011 1050 - 1143   2TA, GT PT Assessment/Plan  PT - Assessment/Plan Comments on Treatment Session: Reviews car transfers with pt and spouse PT Plan: Discharge plan remains appropriate Follow Up Recommendations: Home health PT PT Goals  Acute Rehab PT Goals PT Transfer Goal: Sit to Stand/Stand to Sit - Progress: Partly met PT Goal: Ambulate - Progress: Met PT Goal: Up/Down Stairs - Progress: Met  PT Treatment Precautions/Restrictions  Precautions Precautions: Posterior Hip Precaution Booklet Issued: Yes (comment) Precaution Comments: Sign hung in room Restrictions Weight Bearing Restrictions: Yes RUE Weight Bearing: Partial weight bearing RUE Partial Weight Bearing Percentage or Pounds: 25-50 RLE Weight Bearing: Partial weight bearing RLE Partial Weight Bearing Percentage or Pounds: 25-50% Other Position/Activity Restrictions: posterior THP Mobility (including Balance) Bed Mobility Bed Mobility: No Supine to Sit:  (Pt OOB with OT -  states did well and comfortable with abili) Transfers Transfers: Yes Sit to Stand: 4: Min assist Sit to Stand Details (indicate cue type and reason): steady RW and cues for LE positioning Stand to Sit: 4: Min assist Ambulation/Gait Ambulation/Gait: Yes Ambulation/Gait Assistance: 5: Supervision;4: Min assist Ambulation/Gait Assistance Details (indicate cue type and reason): position from RW Ambulation Distance (Feet): 100 Feet Assistive device: Rolling walker Gait Pattern: Step-to pattern Stairs: Yes Stairs Assistance: 4: Min assist Stairs Assistance Details (indicate cue type and reason): cues for sequence and foot/rw placement - written instructions provided - wife educated on assisting Stair Management Technique: No rails;Backwards;With walker Number of Stairs: 2     Exercise    End of Session PT - End of  Session Activity Tolerance: Patient tolerated treatment well Patient left: in chair General Behavior During Session: [facility] for tasks performed Cognition: [facility] for tasks performed  Larry Alcock 04/01/2011, 12:29 PM

## 2011-04-03 ENCOUNTER — Encounter (HOSPITAL_COMMUNITY): Payer: Self-pay | Admitting: Orthopedic Surgery

## 2011-04-26 ENCOUNTER — Other Ambulatory Visit: Payer: Self-pay | Admitting: Family Medicine

## 2011-07-19 ENCOUNTER — Other Ambulatory Visit (HOSPITAL_COMMUNITY): Payer: Self-pay | Admitting: Podiatry

## 2011-07-19 DIAGNOSIS — S86319A Strain of muscle(s) and tendon(s) of peroneal muscle group at lower leg level, unspecified leg, initial encounter: Secondary | ICD-10-CM

## 2011-07-19 DIAGNOSIS — S93609A Unspecified sprain of unspecified foot, initial encounter: Secondary | ICD-10-CM

## 2011-07-20 ENCOUNTER — Other Ambulatory Visit (HOSPITAL_COMMUNITY): Payer: Self-pay | Admitting: Podiatry

## 2011-07-20 ENCOUNTER — Ambulatory Visit (HOSPITAL_COMMUNITY)
Admission: RE | Admit: 2011-07-20 | Discharge: 2011-07-20 | Disposition: A | Discharge status: Home / Self Care | Payer: 59 | Source: Ambulatory Visit | Attending: Podiatry | Admitting: Podiatry

## 2011-07-20 DIAGNOSIS — M25579 Pain in unspecified ankle and joints of unspecified foot: Secondary | ICD-10-CM | POA: Insufficient documentation

## 2011-07-20 DIAGNOSIS — S93609A Unspecified sprain of unspecified foot, initial encounter: Secondary | ICD-10-CM

## 2011-07-20 DIAGNOSIS — S86319A Strain of muscle(s) and tendon(s) of peroneal muscle group at lower leg level, unspecified leg, initial encounter: Secondary | ICD-10-CM

## 2011-08-02 ENCOUNTER — Encounter (HOSPITAL_COMMUNITY)
Admission: RE | Admit: 2011-08-02 | Discharge: 2011-08-02 | Disposition: A | Discharge status: Home / Self Care | Payer: 59 | Source: Ambulatory Visit | Attending: Podiatry | Admitting: Podiatry

## 2011-08-02 ENCOUNTER — Encounter: Payer: Self-pay | Admitting: Internal Medicine

## 2011-08-02 ENCOUNTER — Encounter (HOSPITAL_COMMUNITY): Payer: Self-pay

## 2011-08-02 ENCOUNTER — Encounter (HOSPITAL_COMMUNITY): Payer: Self-pay | Admitting: Pharmacy Technician

## 2011-08-02 LAB — BASIC METABOLIC PANEL
CO2: 27 mEq/L (ref 19–32)
Calcium: 9.7 mg/dL (ref 8.4–10.5)
Creatinine, Ser: 0.79 mg/dL (ref 0.50–1.35)
Glucose, Bld: 98 mg/dL (ref 70–99)

## 2011-08-02 LAB — SURGICAL PCR SCREEN: Staphylococcus aureus: NEGATIVE

## 2011-08-02 NOTE — Patient Instructions (Signed)
20 Jonathan Calhoun  08/02/2011   Your procedure is scheduled on:  Thursday, 08/10/11  Report to [facility] at 0845 AM.  Call this number if you have problems the morning of surgery: [phone]   Remember:   Do not eat food:After Midnight.  May have clear liquids:until Midnight .  Clear liquids include soda, tea, black coffee, apple or grape juice, broth.  Take these medicines the morning of surgery with A SIP OF WATER: ultram and lisinopril   Do not wear jewelry, make-up or nail polish.  Do not wear lotions, powders, or perfumes. You may wear deodorant.  Do not shave 48 hours prior to surgery.  Do not bring valuables to the hospital.  Contacts, dentures or bridgework may not be worn into surgery.  Leave suitcase in the car. After surgery it may be brought to your room.  For patients admitted to the hospital, checkout time is 11:00 AM the day of discharge.   Patients discharged the day of surgery will not be allowed to drive home.  Name and phone number of your driver: driver  Special Instructions: CHG Shower Use Special Wash: 1/2 bottle night before surgery and 1/2 bottle morning of surgery.   Please read over the following fact sheets that you were given: Pain Booklet, MRSA Information, Surgical Site Infection Prevention, Anesthesia Post-op Instructions and Care and Recovery After Surgery

## 2011-08-09 NOTE — H&P (Signed)
Chief Complaint / History of Present Illness: This 61 year old male returns today for a consultation to discusses the proposed surgery.  He is scheduled for exostectomy of calcaneus, synovectomy of peroneal tendon left foot and repair of peroneal tendon tear of the left foot on 08/10/2011 at [facility].  Past Medical History:  Endocrine Hx: (+) diabetes,.   Cardiovascular Hx: (+) hypertension, high cholesterol.   Medication History: Active: Lortab 500 mg-7.5 mg tablet (Take 1 tablet by mouth every six hours as needed for pain.) (active); usage started on 07/29/2011 medication was prescribed by Theola Sequin DPM, lisinopril (active), Glucophage (active), glipizide (active).   Allergies: No known medical allergies Past Surgical History: Patient/Guardian admits past surgical history of T AND A, cholecystectomy, excision of plantar fibroma, hip replacement (right) in 2012.   Social History: Patient/Guardian denies alcohol use, Patient/Guardian denies illegal drug use, Patient/Guardian denies tobacco use.   Family History: Patient denies a family history of any health problems.   Review of Systems: No abnormal findings with the exception of the chief complaint.    Physical Exam: The patient is a pleasant, 61 year old male in no apparent distress.  He is oriented to person, place and time.  Skin is warm dry and supple.  No open lesions are noted.  Dorsalis pedis and posterior tibial pulses are palpable.  There is a firm prominence noted on the lateral aspect of the left calcaneus in the region of the peroneal tubercle.  This area is tender to palpation.  There is edema along the lateral aspect of the left ankle following the course of the peroneal tendons.  There is pain along the peroneal tendons with passive inversion of the left foot and pain with active eversion against resistance.  Light touch sensation is grossly intact.    Test Results:  None to report at this time.    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  Large peroneal tubercle of the left lower extremity.  Plan:   The podiatric pathology and treatment options were again reviewed with the him.  I discussed with the him the surgical procedure itself, the indications, the risks, possible complications, post-operative course and alternative treatments. I gave no guarantees regarding the outcome. He would like to proceed with the proposed surgery.  An informed consent was obtained.  He is to return for his post-operative appointment or sooner if problems arise.  Prescriptions: Rx: Keflex- 500 mg Capsule, Take 1 capsule by mouth four times a day. Max daily dose: 4.  Dispense: 28 capsule. Refills: 0.  Allow Generic: Yes Rx: Phenergan- 12.5 mg tablet , Take 1 tablet by mouth every four to six hours as needed. Max daily dose: 6.  Dispense: 21 tablet. Refills: 1.  Allow Generic: Yes Rx: Percocet- 325 mg-10mg  tablet , Take 1 tablet by mouth every six hours as needed for pain. Max daily dose: 4.  Dispense: 56 tablet. Refills: 0.  Allow Generic: Yes

## 2011-08-10 ENCOUNTER — Encounter (HOSPITAL_COMMUNITY): Payer: Self-pay | Admitting: Anesthesiology

## 2011-08-10 ENCOUNTER — Ambulatory Visit (HOSPITAL_COMMUNITY): Payer: 59

## 2011-08-10 ENCOUNTER — Encounter (HOSPITAL_COMMUNITY)
Admission: RE | Disposition: A | Discharge status: Home / Self Care | Payer: Self-pay | Source: Ambulatory Visit | Attending: Podiatry

## 2011-08-10 ENCOUNTER — Ambulatory Visit (HOSPITAL_COMMUNITY): Payer: 59 | Admitting: Anesthesiology

## 2011-08-10 ENCOUNTER — Encounter (HOSPITAL_COMMUNITY): Payer: Self-pay | Admitting: *Deleted

## 2011-08-10 ENCOUNTER — Ambulatory Visit (HOSPITAL_COMMUNITY)
Admission: RE | Admit: 2011-08-10 | Discharge: 2011-08-10 | Disposition: A | Discharge status: Home / Self Care | Payer: 59 | Source: Ambulatory Visit | Attending: Podiatry | Admitting: Podiatry

## 2011-08-10 DIAGNOSIS — Z01812 Encounter for preprocedural laboratory examination: Secondary | ICD-10-CM | POA: Insufficient documentation

## 2011-08-10 DIAGNOSIS — E119 Type 2 diabetes mellitus without complications: Secondary | ICD-10-CM | POA: Insufficient documentation

## 2011-08-10 DIAGNOSIS — Z79899 Other long term (current) drug therapy: Secondary | ICD-10-CM | POA: Insufficient documentation

## 2011-08-10 DIAGNOSIS — M24176 Other articular cartilage disorders, unspecified foot: Secondary | ICD-10-CM | POA: Insufficient documentation

## 2011-08-10 DIAGNOSIS — M659 Synovitis and tenosynovitis, unspecified: Secondary | ICD-10-CM

## 2011-08-10 DIAGNOSIS — M65979 Unspecified synovitis and tenosynovitis, unspecified ankle and foot: Secondary | ICD-10-CM | POA: Insufficient documentation

## 2011-08-10 DIAGNOSIS — M7730 Calcaneal spur, unspecified foot: Secondary | ICD-10-CM | POA: Insufficient documentation

## 2011-08-10 DIAGNOSIS — M249 Joint derangement, unspecified: Secondary | ICD-10-CM | POA: Insufficient documentation

## 2011-08-10 DIAGNOSIS — I10 Essential (primary) hypertension: Secondary | ICD-10-CM | POA: Insufficient documentation

## 2011-08-10 HISTORY — PX: SYNOVECTOMY: SHX5180

## 2011-08-10 HISTORY — PX: BONE EXOSTOSIS EXCISION: SHX1249

## 2011-08-10 HISTORY — PX: TENDON REPAIR: SHX5111

## 2011-08-10 SURGERY — EXCISION, EXOSTOSIS
Anesthesia: General | Site: Foot | Laterality: Left | Wound class: Clean

## 2011-08-10 MED ORDER — LACTATED RINGERS IV SOLN
INTRAVENOUS | Status: DC | PRN
  Administered 2011-08-10 (×2): via INTRAVENOUS

## 2011-08-10 MED ORDER — BUPIVACAINE HCL (PF) 0.5 % IJ SOLN
INTRAMUSCULAR | Status: DC | PRN
  Administered 2011-08-10: 20 mL

## 2011-08-10 MED ORDER — MIDAZOLAM HCL 2 MG/2ML IJ SOLN
INTRAMUSCULAR | Status: AC
  Filled 2011-08-10: qty 2

## 2011-08-10 MED ORDER — GLYCOPYRROLATE 0.2 MG/ML IJ SOLN
INTRAMUSCULAR | Status: AC
  Filled 2011-08-10: qty 2

## 2011-08-10 MED ORDER — CEFAZOLIN SODIUM 1-5 GM-% IV SOLN
INTRAVENOUS | Status: DC | PRN
  Administered 2011-08-10: 2 g via INTRAVENOUS

## 2011-08-10 MED ORDER — FENTANYL CITRATE 0.05 MG/ML IJ SOLN
INTRAMUSCULAR | Status: DC | PRN
  Administered 2011-08-10 (×2): 50 ug via INTRAVENOUS
  Administered 2011-08-10: 100 ug via INTRAVENOUS

## 2011-08-10 MED ORDER — LIDOCAINE HCL (CARDIAC) 10 MG/ML IV SOLN
INTRAVENOUS | Status: DC | PRN
  Administered 2011-08-10: 50 mg via INTRAVENOUS

## 2011-08-10 MED ORDER — GLYCOPYRROLATE 0.2 MG/ML IJ SOLN
INTRAMUSCULAR | Status: DC | PRN
  Administered 2011-08-10: 0.6 mg via INTRAVENOUS

## 2011-08-10 MED ORDER — MIDAZOLAM HCL 2 MG/2ML IJ SOLN
1.0000 mg | INTRAMUSCULAR | Status: DC | PRN
  Administered 2011-08-10: 2 mg via INTRAVENOUS

## 2011-08-10 MED ORDER — ONDANSETRON HCL 4 MG/2ML IJ SOLN
INTRAMUSCULAR | Status: AC
  Filled 2011-08-10: qty 2

## 2011-08-10 MED ORDER — NEOSTIGMINE METHYLSULFATE 1 MG/ML IJ SOLN
INTRAMUSCULAR | Status: DC | PRN
  Administered 2011-08-10: 4 mg via INTRAVENOUS

## 2011-08-10 MED ORDER — CEFAZOLIN SODIUM-DEXTROSE 2-3 GM-% IV SOLR
INTRAVENOUS | Status: AC
  Filled 2011-08-10: qty 50

## 2011-08-10 MED ORDER — PROPOFOL 10 MG/ML IV EMUL
INTRAVENOUS | Status: DC | PRN
  Administered 2011-08-10: 150 mg via INTRAVENOUS

## 2011-08-10 MED ORDER — NEOSTIGMINE METHYLSULFATE 1 MG/ML IJ SOLN
INTRAMUSCULAR | Status: AC
  Filled 2011-08-10: qty 10

## 2011-08-10 MED ORDER — FENTANYL CITRATE 0.05 MG/ML IJ SOLN
INTRAMUSCULAR | Status: AC
  Filled 2011-08-10: qty 2

## 2011-08-10 MED ORDER — LACTATED RINGERS IV SOLN
INTRAVENOUS | Status: DC
  Administered 2011-08-10: 09:00:00 via INTRAVENOUS

## 2011-08-10 MED ORDER — ROCURONIUM BROMIDE 100 MG/10ML IV SOLN
INTRAVENOUS | Status: DC | PRN
  Administered 2011-08-10: 35 mg via INTRAVENOUS

## 2011-08-10 MED ORDER — LIDOCAINE HCL (PF) 1 % IJ SOLN
INTRAMUSCULAR | Status: AC
  Filled 2011-08-10: qty 5

## 2011-08-10 MED ORDER — BUPIVACAINE HCL (PF) 0.5 % IJ SOLN
INTRAMUSCULAR | Status: AC
  Filled 2011-08-10: qty 30

## 2011-08-10 MED ORDER — PROPOFOL 10 MG/ML IV EMUL
INTRAVENOUS | Status: AC
  Filled 2011-08-10: qty 20

## 2011-08-10 MED ORDER — ONDANSETRON HCL 4 MG/2ML IJ SOLN
INTRAMUSCULAR | Status: DC | PRN
  Administered 2011-08-10: 4 mg via INTRAVENOUS

## 2011-08-10 MED ORDER — EPHEDRINE SULFATE 50 MG/ML IJ SOLN
INTRAMUSCULAR | Status: DC | PRN
Start: 2011-08-10 — End: 2011-08-10
  Administered 2011-08-10: 10 mg via INTRAVENOUS
  Administered 2011-08-10: 5 mg via INTRAVENOUS
  Administered 2011-08-10: 10 mg via INTRAVENOUS

## 2011-08-10 MED ORDER — CEFAZOLIN SODIUM-DEXTROSE 2-3 GM-% IV SOLR: 2.0000 g | Freq: Once | INTRAVENOUS | Status: DC

## 2011-08-10 MED ORDER — ROCURONIUM BROMIDE 50 MG/5ML IV SOLN
INTRAVENOUS | Status: AC
  Filled 2011-08-10: qty 1

## 2011-08-10 MED ORDER — ONDANSETRON HCL 4 MG/2ML IJ SOLN: 4.0000 mg | Freq: Once | INTRAMUSCULAR | Status: DC | PRN

## 2011-08-10 MED ORDER — 0.9 % SODIUM CHLORIDE (POUR BTL) OPTIME
TOPICAL | Status: DC | PRN
  Administered 2011-08-10: 1000 mL

## 2011-08-10 MED ORDER — EPHEDRINE SULFATE 50 MG/ML IJ SOLN
INTRAMUSCULAR | Status: AC
  Filled 2011-08-10: qty 1

## 2011-08-10 MED ORDER — FENTANYL CITRATE 0.05 MG/ML IJ SOLN: 25.0000 ug | INTRAMUSCULAR | Status: DC | PRN

## 2011-08-10 MED ORDER — ENOXAPARIN SODIUM 40 MG/0.4ML ~~LOC~~ SOLN: 40.0000 mg | SUBCUTANEOUS | Status: AC

## 2011-08-10 SURGICAL SUPPLY — 60 items
BAG HAMPER (MISCELLANEOUS) ×2 IMPLANT
BANDAGE ELASTIC 4 VELCRO NS (GAUZE/BANDAGES/DRESSINGS) ×2 IMPLANT
BANDAGE ELASTIC 6 VELCRO NS (GAUZE/BANDAGES/DRESSINGS) ×2 IMPLANT
BANDAGE ESMARK 4X12 BL STRL LF (DISPOSABLE) ×1 IMPLANT
BANDAGE GAUZE ELAST BULKY 4 IN (GAUZE/BANDAGES/DRESSINGS) ×1 IMPLANT
BLADE AVERAGE 25X9 (BLADE) ×1 IMPLANT
BLADE OSC/SAG 18.5X9 THN (BLADE) ×2 IMPLANT
BLADE SURG 15 STRL LF DISP TIS (BLADE) ×1 IMPLANT
BLADE SURG 15 STRL SS (BLADE) ×2
BNDG CMPR 12X4 ELC STRL LF (DISPOSABLE) ×1
BNDG ESMARK 4X12 BLUE STRL LF (DISPOSABLE) ×2
CHLORAPREP W/TINT 26ML (MISCELLANEOUS) ×2 IMPLANT
CLOTH BEACON ORANGE TIMEOUT ST (SAFETY) ×2 IMPLANT
COVER LIGHT HANDLE STERIS (MISCELLANEOUS) ×2 IMPLANT
COVER SURGICAL LIGHT HANDLE (MISCELLANEOUS) ×4 IMPLANT
CUFF TOURNIQUET SINGLE 18IN (TOURNIQUET CUFF) ×2 IMPLANT
CUFF TOURNIQUET SINGLE 34IN LL (TOURNIQUET CUFF) ×1 IMPLANT
DRAIN PENROSE 12X.25 LTX STRL (MISCELLANEOUS) ×2 IMPLANT
DRAPE C-ARM FOLDED MOBILE STRL (DRAPES) ×1 IMPLANT
DRAPE OEC MINIVIEW 54X84 (DRAPES) ×1 IMPLANT
DRSG ADAPTIC 3X8 NADH LF (GAUZE/BANDAGES/DRESSINGS) ×3 IMPLANT
DURA STEPPER LG (CAST SUPPLIES) ×1 IMPLANT
DURA STEPPER MED (CAST SUPPLIES) IMPLANT
DURA STEPPER SML (CAST SUPPLIES) IMPLANT
DURA STEPPER XL (SOFTGOODS) IMPLANT
ELECT REM PT RETURN 9FT ADLT (ELECTROSURGICAL) ×2
ELECTRODE REM PT RTRN 9FT ADLT (ELECTROSURGICAL) ×1 IMPLANT
GLOVE BIO SURGEON STRL SZ7.5 (GLOVE) ×2 IMPLANT
GOWN PREVENTION PLUS XLARGE (GOWN DISPOSABLE) ×3 IMPLANT
GOWN STRL REIN XL XLG (GOWN DISPOSABLE) ×6 IMPLANT
GRAFT JACKET PERIOST 5CMX5CM (Orthopedic Implant) ×1 IMPLANT
KIT ROOM TURNOVER APOR (KITS) ×2 IMPLANT
MANIFOLD NEPTUNE II (INSTRUMENTS) ×2 IMPLANT
NDL HYPO 18GX1.5 BLUNT FILL (NEEDLE) ×1 IMPLANT
NDL HYPO 27GX1-1/4 (NEEDLE) ×3 IMPLANT
NDL MAYO 6 CRC TAPER PT (NEEDLE) IMPLANT
NEEDLE HYPO 18GX1.5 BLUNT FILL (NEEDLE) IMPLANT
NEEDLE HYPO 27GX1-1/4 (NEEDLE) ×4 IMPLANT
NEEDLE MAYO 6 CRC TAPER PT (NEEDLE) IMPLANT
NS IRRIG 1000ML POUR BTL (IV SOLUTION) ×2 IMPLANT
PACK BASIC LIMB (CUSTOM PROCEDURE TRAY) ×2 IMPLANT
PAD ABD 5X9 TENDERSORB (GAUZE/BANDAGES/DRESSINGS) ×1 IMPLANT
PAD ARMBOARD 7.5X6 YLW CONV (MISCELLANEOUS) ×2 IMPLANT
RASP SM TEAR CROSS CUT (RASP) ×2 IMPLANT
SET BASIN LINEN APH (SET/KITS/TRAYS/PACK) ×2 IMPLANT
SPLINT FIBERGLASS 4X30 (CAST SUPPLIES) ×1 IMPLANT
SPLINT J IMMOBILIZER 4X20FT (CAST SUPPLIES) ×1 IMPLANT
SPLINT J PLASTER J 4INX20Y (CAST SUPPLIES)
SPONGE GAUZE 4X4 12PLY (GAUZE/BANDAGES/DRESSINGS) ×2 IMPLANT
STAPLER VISISTAT (STAPLE) ×1 IMPLANT
STRIP CLOSURE SKIN 1/2X4 (GAUZE/BANDAGES/DRESSINGS) ×3 IMPLANT
SUT BONE WAX W31G (SUTURE) ×2 IMPLANT
SUT ETHIBOND CT1 BRD 2-0 30IN (SUTURE) IMPLANT
SUT MON AB 5-0 PS2 18 (SUTURE) ×2 IMPLANT
SUT PROLENE 4 0 PS 2 18 (SUTURE) ×3 IMPLANT
SUT VIC AB 4-0 PS2 27 (SUTURE) ×3 IMPLANT
SUT VICRYL AB 3-0 FS1 BRD 27IN (SUTURE) IMPLANT
SYR CONTROL 10ML LL (SYRINGE) ×6 IMPLANT
TOWEL OR 17X26 4PK STRL BLUE (TOWEL DISPOSABLE) ×2 IMPLANT
WATER STERILE IRR 1000ML POUR (IV SOLUTION) ×2 IMPLANT

## 2011-08-10 NOTE — Transfer of Care (Signed)
Immediate Anesthesia Transfer of Care Note  Patient: Jonathan Calhoun  Procedure(s) Performed: Procedure(s) (LRB): EXOSTOSIS EXCISION (Left) SYNOVECTOMY (Left) TENDON REPAIR (Left)  Patient Location: PACU  Anesthesia Type: General  Level of Consciousness: awake, alert , oriented and patient cooperative  Airway & Oxygen Therapy: Patient Spontanous Breathing and Patient connected to face mask oxygen  Post-op Assessment: Report given to PACU RN and Post -op Vital signs reviewed and stable  Post vital signs: Reviewed and stable  Complications: No apparent anesthesia complications

## 2011-08-10 NOTE — Anesthesia Preprocedure Evaluation (Addendum)
Anesthesia Evaluation  Patient identified by MRN, date of birth, ID band Patient awake    Reviewed: Allergy & Precautions, H&P , NPO status , Patient's Chart, lab work & pertinent test results  Airway Mallampati: I  Neck ROM: Full    Dental  (+) Teeth Intact and Missing   Pulmonary asthma , Current Smoker,  breath sounds clear to auscultation        Cardiovascular hypertension, Pt. on medications Rhythm:Regular Rate:Normal     Neuro/Psych    GI/Hepatic   Endo/Other  Diabetes mellitus-, Well Controlled, Type 2, Oral Hypoglycemic Agents  Renal/GU      Musculoskeletal   Abdominal   Peds  Hematology   Anesthesia Other Findings   Reproductive/Obstetrics                           Anesthesia Physical Anesthesia Plan  ASA: III  Anesthesia Plan: General   Post-op Pain Management:    Induction: Intravenous  Airway Management Planned: Oral ETT  Additional Equipment:   Intra-op Plan:   Post-operative Plan: Extubation in OR  Informed Consent: I have reviewed the patients History and Physical, chart, labs and discussed the procedure including the risks, benefits and alternatives for the proposed anesthesia with the patient or authorized representative who has indicated his/her understanding and acceptance.     Plan Discussed with:   Anesthesia Plan Comments:         Anesthesia Quick Evaluation

## 2011-08-10 NOTE — Anesthesia Procedure Notes (Signed)
Procedure Name: Intubation Date/Time: 08/10/2011 9:58 AM Performed by: Carolyne Littles, Rhiannon Sassaman L Pre-anesthesia Checklist: Patient identified, Patient being monitored, Timeout performed, Emergency Drugs available and Suction available Patient Re-evaluated:Patient Re-evaluated prior to inductionOxygen Delivery Method: Circle System Utilized Preoxygenation: Pre-oxygenation with 100% oxygen Intubation Type: IV induction Ventilation: Mask ventilation without difficulty Laryngoscope Size: Mac, 3 and Miller Grade View: Grade I Tube type: Oral Tube size: 7.0 mm Number of attempts: 1 Airway Equipment and Method: stylet Placement Confirmation: ETT inserted through vocal cords under direct vision,  positive ETCO2 and breath sounds checked- equal and bilateral Secured at: 21 cm Tube secured with: Tape Dental Injury: Teeth and Oropharynx as per pre-operative assessment

## 2011-08-10 NOTE — Preoperative (Signed)
Beta Blockers   Reason not to administer Beta Blockers:Not Applicable 

## 2011-08-10 NOTE — H&P (Signed)
HISTORY AND PHYSICAL INTERVAL NOTE:  08/10/2011  9:35 AM  Jonathan Calhoun  has presented today for surgery, with the diagnosis of symptomatic peroneal tubercle, peroneus longus tear left foot, peroneal tenosynovitis left foot.  The various methods of treatment have been discussed with the patient.  No guarantees were given.  After consideration of risks, benefits and other options for treatment, the patient has consented to surgery.  I have reviewed the patients' chart and labs.    Patient Vitals for the past 24 hrs:  BP Temp Temp src Pulse Resp SpO2  08/10/11 0920 106/76 mmHg - - - 26  95 %  08/10/11 0915 110/70 mmHg - - - 15  90 %  08/10/11 0910 108/72 mmHg - - - 17  95 %  08/10/11 0905 109/75 mmHg - - - 12  95 %  08/10/11 0900 109/75 mmHg - - - 21  95 %  08/10/11 0841 109/68 mmHg 98.7 F (37.1 C) Oral 63  24  95 %    A history and physical examination was performed in my office on 08/02/2011.  The patient was reexamined.  There have been no changes to this history and physical examination.  Dallas Schimke, DPM

## 2011-08-10 NOTE — Anesthesia Postprocedure Evaluation (Signed)
  Anesthesia Post-op Note  Patient: Jonathan Calhoun  Procedure(s) Performed: Procedure(s) (LRB): EXOSTOSIS EXCISION (Left) SYNOVECTOMY (Left) TENDON REPAIR (Left)  Patient Location: PACU  Anesthesia Type: General  Level of Consciousness: awake, alert , oriented and patient cooperative  Airway and Oxygen Therapy: Patient Spontanous Breathing and Patient connected to face mask oxygen  Post-op Pain: mild  Post-op Assessment: Post-op Vital signs reviewed, Patient's Cardiovascular Status Stable, Respiratory Function Stable, Patent Airway, No signs of Nausea or vomiting and Adequate PO intake  Post-op Vital Signs: Reviewed and stable  Complications: No apparent anesthesia complications

## 2011-08-14 ENCOUNTER — Encounter (HOSPITAL_COMMUNITY): Payer: Self-pay | Admitting: Podiatry

## 2011-08-14 NOTE — Op Note (Signed)
OPERATIVE REPORT  SURGEON:   Dallas Schimke, [place]  OR STAFF:   Eliane Decree Page, RN - Circulator Sherri Sear, CST - Scrub Person Nicki Reaper, RN - RN First Assistant Rogene Houston, RN - Circulator Assistant   PREOPERATIVE DIAGNOSIS:   1. Symptomatic peroneal tubercle left foot 2. Peroneal tenosynovitis left foot 3. Peroneus longus tear left foot  POSTOPERATIVE DIAGNOSIS: 1. Symptomatic peroneal tubercle left foot 2. Peroneal tenosynovitis left foot 3. Peroneus longus tear left foot  PROCEDURE: 1. Exostectomy of the left foot 2. Synovectomy of the left foot 3. Repair of peroneus longus tear left foot  ANESTHESIA:  General   HEMOSTASIS:    Total Tourniquet Time Documented: Calf (Left) - 71 minutes; Tourniquet set at 250 mmHg  ESTIMATED BLOOD LOSS:   Minimal (<5cc)   MATERIALS USED:  Graft jacket   INJECTABLES: Marcaine 0.5% plain; 20mL  PATHOLOGY:   Soft tissue consistent with tendon and synovities  COMPLICATIONS:   None  PROCEDURE IN DETAIL: The patient was brought to the operating room and placed on the operative table in the supine position.  A pneumatic calf tourniquet was applied to the patient's left lower extremity.  After general anesthesia was obtained the patient was placed in a lateral decubitus position with the right lower extremity down.  The foot was anesthetized using 0.5% Marcaine plain.  The foot was scrubbed prepped and draped in the usual sterile manner.  The limb was then elevated exsanguinated pneumatic calf tourniquet inflated to 250 mmHg.  Attention was directed to the lateral aspect of the left foot where a prominent bony eminence was noted along the lateral aspect of the calcaneus.  This is consistent with his preoperative diagnoses of a prominent peroneal tubercle.  A curvilinear incision was made overlying the peroneal tubercle following the course of the peroneal tendons.  Dissection was continued deep down to  level of the peroneal tendons.  The peroneus brevis tendon was reflected superiorly and the peroneus longus tendon reflected inferiorly.  A prominent peroneal tubercle was identified and resected using a power bone saw and smoothed with a power rasp.  Bone wax was applied to the area.  The linear longitudinal incision was made in the tendon sheath of the peroneus longus tendon.  A large amount of synovial fluid was evacuated.  The synovial tissue was hypertrophied and debrided and passed from the operative field and sent to pathology for evaluation.  A partial-thickness tear of the peroneus longus tendon was identified.  The longitudinal tear was excised.  The tendon was repaired using 4-0 Vicryl.  A graft jacket was applied to reinforce this repair.  The wound was irrigated with copious amounts of sterile irrigant.  The tendon sheath was reapproximated using 4-0 Vicryl.  Subcutaneous structures reapproximated using 4-0 Vicryl.  The skin was reapproximated using 5-0 Monocryl in a running subcuticular manner and reinforced with Prolene.  Steri-Strips were applied.  A sterile compressive dressing was applied to the left lower extremity.  The pneumatic calf tourniquet was deflated and a prompt hyperemic response was noted to all digits of the foot.  A below-knee posterior splint was applied to the left lower extremity with the foot in slight eversion.  The patient tolerated the procedure and anesthesia well.  He was transferred from the operating room to the Postanesthesia care unit with vital signs stable and vascular status intact to all digits of the operative foot.

## 2011-08-14 NOTE — Brief Op Note (Addendum)
OPERATIVE REPORT  SURGEON:   Dallas Schimke, [place]  OR STAFF:   Eliane Decree Page, RN - Circulator Sherri Sear, CST - Scrub Person Nicki Reaper, RN - RN First Assistant Rogene Houston, RN - Circulator Assistant   PREOPERATIVE DIAGNOSIS:   1. Symptomatic peroneal tubercle left foot 2. Peroneal tenosynovitis left foot 3. Peroneus longus tear left foot  POSTOPERATIVE DIAGNOSIS: 1. Symptomatic peroneal tubercle left foot 2. Peroneal tenosynovitis left foot 3. Peroneus longus tear left foot  PROCEDURE: 1. Exostectomy of the left foot 2. Synovectomy of the left foot 3. Repair of peroneus longus tear left foot  ANESTHESIA:  General   HEMOSTASIS:    Total Tourniquet Time Documented: Calf (Left) - 71 minutes; Tourniquet set at 250 mmHg  ESTIMATED BLOOD LOSS:   Minimal (<5cc)   MATERIALS USED:  Graft jacket   INJECTABLES: Marcaine 0.5% plain; 20mL  PATHOLOGY:   Soft tissue consistent with tendon and synovities  COMPLICATIONS:   None  INDICATIONS:  Painful peroneal tubercle.  Painful swelling along the peroneal tendon.  DICTATION:  Office manager

## 2011-09-14 ENCOUNTER — Ambulatory Visit: Payer: 59 | Attending: Podiatry | Admitting: Physical Therapy

## 2011-09-14 DIAGNOSIS — M25673 Stiffness of unspecified ankle, not elsewhere classified: Secondary | ICD-10-CM | POA: Insufficient documentation

## 2011-09-14 DIAGNOSIS — M25579 Pain in unspecified ankle and joints of unspecified foot: Secondary | ICD-10-CM | POA: Insufficient documentation

## 2011-09-14 DIAGNOSIS — IMO0001 Reserved for inherently not codable concepts without codable children: Secondary | ICD-10-CM | POA: Insufficient documentation

## 2011-09-14 DIAGNOSIS — R5381 Other malaise: Secondary | ICD-10-CM | POA: Insufficient documentation

## 2011-09-14 DIAGNOSIS — M25676 Stiffness of unspecified foot, not elsewhere classified: Secondary | ICD-10-CM | POA: Insufficient documentation

## 2011-09-14 DIAGNOSIS — R269 Unspecified abnormalities of gait and mobility: Secondary | ICD-10-CM | POA: Insufficient documentation

## 2011-09-18 ENCOUNTER — Ambulatory Visit: Payer: 59 | Admitting: Physical Therapy

## 2011-09-20 ENCOUNTER — Ambulatory Visit: Payer: 59 | Attending: Podiatry | Admitting: Physical Therapy

## 2011-09-20 DIAGNOSIS — R5381 Other malaise: Secondary | ICD-10-CM | POA: Insufficient documentation

## 2011-09-20 DIAGNOSIS — M25676 Stiffness of unspecified foot, not elsewhere classified: Secondary | ICD-10-CM | POA: Insufficient documentation

## 2011-09-20 DIAGNOSIS — M25579 Pain in unspecified ankle and joints of unspecified foot: Secondary | ICD-10-CM | POA: Insufficient documentation

## 2011-09-20 DIAGNOSIS — R269 Unspecified abnormalities of gait and mobility: Secondary | ICD-10-CM | POA: Insufficient documentation

## 2011-09-20 DIAGNOSIS — IMO0001 Reserved for inherently not codable concepts without codable children: Secondary | ICD-10-CM | POA: Insufficient documentation

## 2011-09-20 DIAGNOSIS — M25673 Stiffness of unspecified ankle, not elsewhere classified: Secondary | ICD-10-CM | POA: Insufficient documentation

## 2011-09-21 ENCOUNTER — Encounter: Payer: Self-pay | Admitting: Family Medicine

## 2011-09-21 ENCOUNTER — Ambulatory Visit (INDEPENDENT_AMBULATORY_CARE_PROVIDER_SITE_OTHER): Payer: 59 | Admitting: Family Medicine

## 2011-09-21 VITALS — BP 120/78 | Temp 98.2°F

## 2011-09-21 DIAGNOSIS — E119 Type 2 diabetes mellitus without complications: Secondary | ICD-10-CM

## 2011-09-21 DIAGNOSIS — F528 Other sexual dysfunction not due to a substance or known physiological condition: Secondary | ICD-10-CM

## 2011-09-21 DIAGNOSIS — E785 Hyperlipidemia, unspecified: Secondary | ICD-10-CM

## 2011-09-21 DIAGNOSIS — I10 Essential (primary) hypertension: Secondary | ICD-10-CM

## 2011-09-21 MED ORDER — SILDENAFIL CITRATE 50 MG PO TABS: 50.0000 mg | ORAL_TABLET | ORAL | Status: DC | PRN

## 2011-09-21 MED ORDER — GLIPIZIDE 10 MG PO TABS: 10.0000 mg | ORAL_TABLET | ORAL | Status: DC

## 2011-09-21 MED ORDER — LISINOPRIL 5 MG PO TABS: 5.0000 mg | ORAL_TABLET | ORAL | Status: DC

## 2011-09-21 MED ORDER — METFORMIN HCL 1000 MG PO TABS: 1000.0000 mg | ORAL_TABLET | ORAL | Status: DC

## 2011-09-21 NOTE — Patient Instructions (Signed)
Continue your current medications Sent  a copy of your recent labs  Followup annual exam in September

## 2011-09-21 NOTE — Progress Notes (Signed)
  Subjective:    Patient ID: BARNELL SHIEH, male    DOB: 04-Sep-1950, 61 y.o.   MRN: [id number]  HPI Mr. lorusso is a 61 year old married male nonsmoker who comes in today for followup of diabetes type 2  He's currently on glipizide 10 mg prior to breakfast and metformin 1000 mg prior to breakfast fasting blood sugar in the 90-100 range. He takes lisinopril 5 mg daily for blood pressure and renal protection from the diabetes. He says he feels well he had an A1c a couple weeks ago prior to foot surgery was normal. He does not recall what the number was. He would like to start some medication for erectile dysfunction.    Review of Systems    general and metabolic review of systems otherwise negative Objective:   Physical Exam Well-developed well-nourished male in no acute distress       Assessment & Plan:  Diabetes type 2 at goal continue current therapy followup in 6 months  Erectile dysfunction Viagra 50 mg when necessary

## 2011-12-18 IMAGING — CR DG HIP COMPLETE 2+V*R*
3 series · 3 of 3 positions shown · non-contrast
Comparison: None.

CLINICAL DATA: Preop right hip replacement.  Right hip pain.

RIGHT HIP - COMPLETE 2+ VIEW

[t pelvis a.p.]
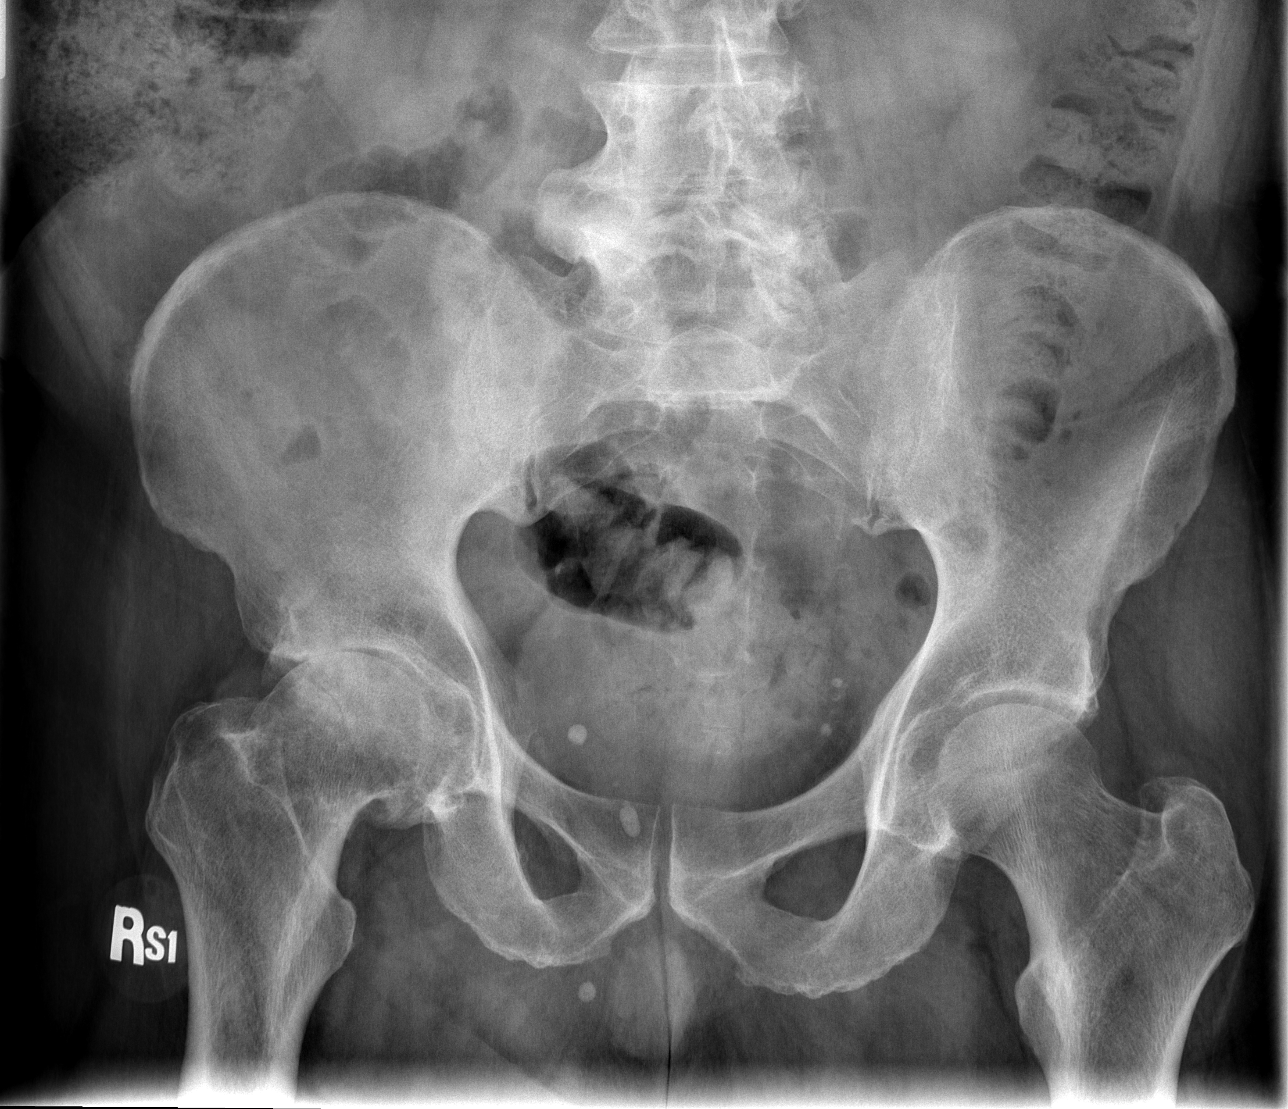

[t hip ap right]
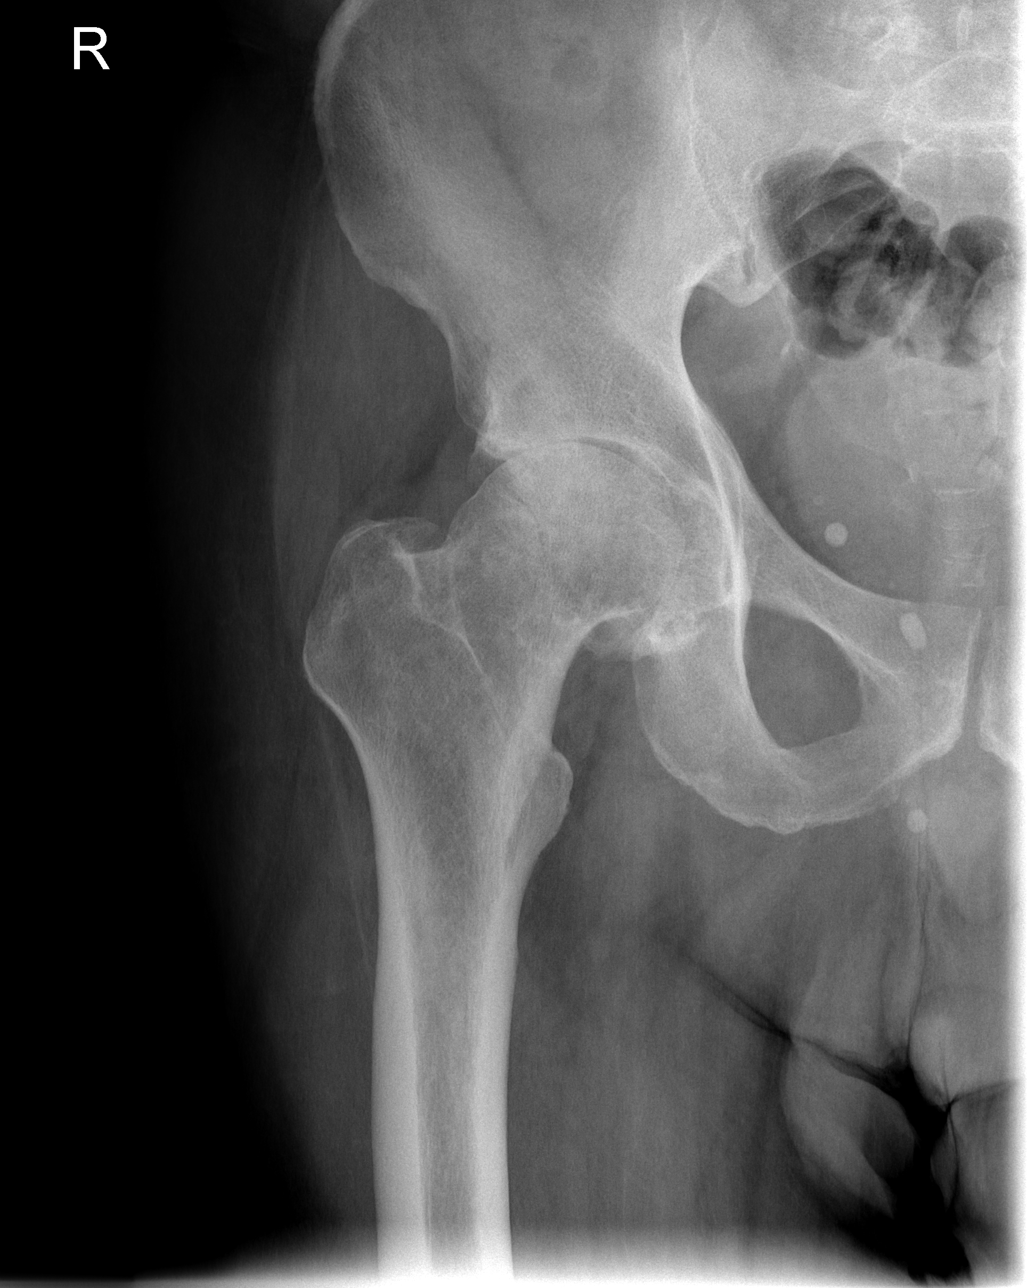

[t hip frog leg right]
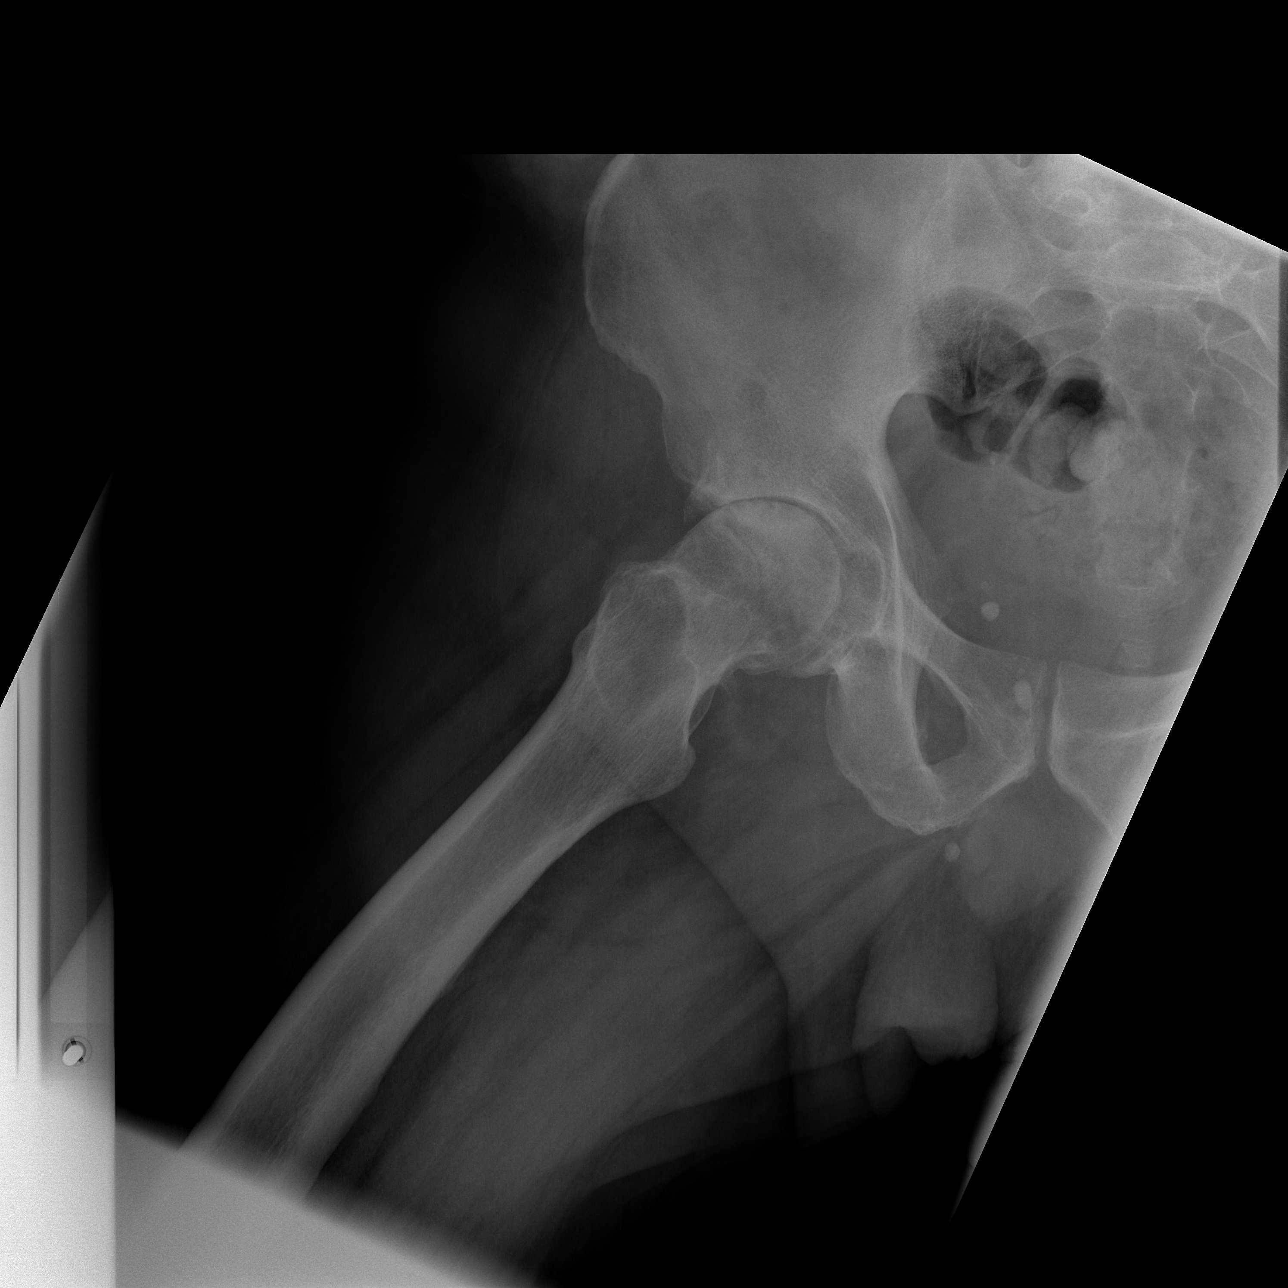

[3 of 3 positions shown; findings below may reference images not displayed]

FINDINGS: Advanced degenerative changes in the right hip with near
complete joint space loss and exuberant osteophyte formation.  Mild
degenerative changes in the left hip. No acute bony abnormality.
Specifically, no fracture, subluxation, or dislocation.  Soft
tissues are intact.
IMPRESSION: Advanced degenerative changes in the right hip.  Mild degenerative
changes in the left hip.  No acute findings.

## 2011-12-26 IMAGING — CR DG PELVIS 1-2V
1 series · 1 of 1 positions shown · non-contrast
Comparison: 01/16/2006

CLINICAL DATA: Postop right hip arthroplasty

PELVIS - 1-2 VIEW

[view not recorded]
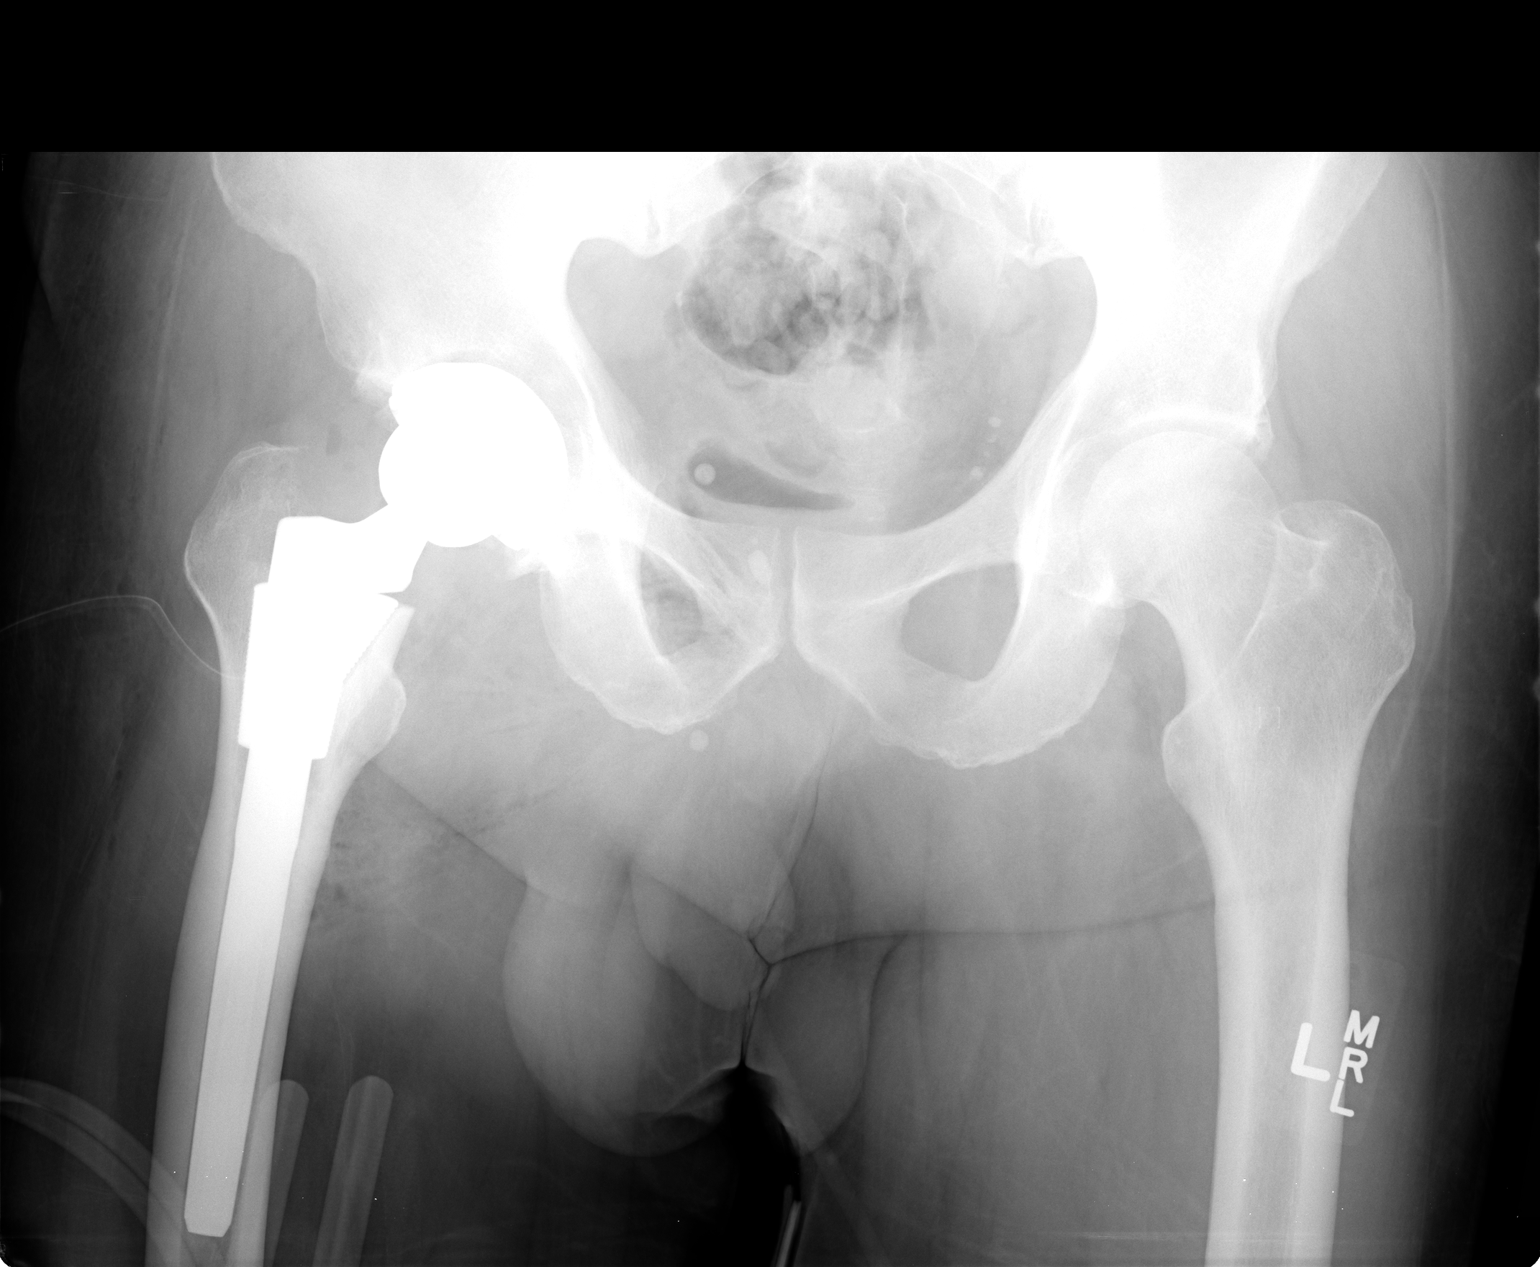

[1 of 1 positions shown; findings below may reference images not displayed]

FINDINGS: Right total hip arthroplasty is anatomically aligned.  No
breakage or loosening of the hardware.
IMPRESSION: Right total arthroplasty anatomically aligned.

## 2012-01-16 ENCOUNTER — Other Ambulatory Visit: Payer: 59

## 2012-01-23 ENCOUNTER — Encounter: Payer: 59 | Admitting: Family Medicine

## 2012-01-23 ENCOUNTER — Other Ambulatory Visit (INDEPENDENT_AMBULATORY_CARE_PROVIDER_SITE_OTHER): Payer: 59

## 2012-01-23 DIAGNOSIS — E119 Type 2 diabetes mellitus without complications: Secondary | ICD-10-CM

## 2012-01-23 LAB — CBC WITH DIFFERENTIAL/PLATELET
Basophils Relative: 0.7 % (ref 0.0–3.0)
Eosinophils Absolute: 0.1 10*3/uL (ref 0.0–0.7)
Eosinophils Relative: 2 % (ref 0.0–5.0)
Lymphocytes Relative: 40.6 % (ref 12.0–46.0)
MCHC: 33.2 g/dL (ref 30.0–36.0)
Neutrophils Relative %: 47.4 % (ref 43.0–77.0)
Platelets: 187 10*3/uL (ref 150.0–400.0)
RBC: 5.08 Mil/uL (ref 4.22–5.81)
WBC: 6.8 10*3/uL (ref 4.5–10.5)

## 2012-01-23 LAB — BASIC METABOLIC PANEL
BUN: 15 mg/dL (ref 6–23)
CO2: 26 mEq/L (ref 19–32)
Calcium: 9.6 mg/dL (ref 8.4–10.5)
Creatinine, Ser: 0.9 mg/dL (ref 0.4–1.5)
Glucose, Bld: 116 mg/dL — ABNORMAL HIGH (ref 70–99)

## 2012-01-23 LAB — POCT URINALYSIS DIPSTICK
Glucose, UA: NEGATIVE
Leukocytes, UA: NEGATIVE
Spec Grav, UA: 1.02
Urobilinogen, UA: 0.2

## 2012-01-23 LAB — MICROALBUMIN / CREATININE URINE RATIO: Microalb Creat Ratio: 1 mg/g (ref 0.0–30.0)

## 2012-01-23 LAB — TSH: TSH: 1.54 u[IU]/mL (ref 0.35–5.50)

## 2012-01-23 LAB — LIPID PANEL
Cholesterol: 192 mg/dL (ref 0–200)
LDL Cholesterol: 130 mg/dL — ABNORMAL HIGH (ref 0–99)
Triglycerides: 105 mg/dL (ref 0.0–149.0)
VLDL: 21 mg/dL (ref 0.0–40.0)

## 2012-01-23 LAB — HEPATIC FUNCTION PANEL
Albumin: 4 g/dL (ref 3.5–5.2)
Total Protein: 7.1 g/dL (ref 6.0–8.3)

## 2012-01-23 LAB — HEMOGLOBIN A1C: Hgb A1c MFr Bld: 6.1 % (ref 4.6–6.5)

## 2012-01-29 ENCOUNTER — Ambulatory Visit (INDEPENDENT_AMBULATORY_CARE_PROVIDER_SITE_OTHER): Payer: 59 | Admitting: Family Medicine

## 2012-01-29 ENCOUNTER — Encounter: Payer: Self-pay | Admitting: Family Medicine

## 2012-01-29 VITALS — BP 120/80 | Temp 98.6°F | Ht 73.5 in | Wt 241.0 lb

## 2012-01-29 DIAGNOSIS — I10 Essential (primary) hypertension: Secondary | ICD-10-CM

## 2012-01-29 DIAGNOSIS — F528 Other sexual dysfunction not due to a substance or known physiological condition: Secondary | ICD-10-CM

## 2012-01-29 DIAGNOSIS — Z23 Encounter for immunization: Secondary | ICD-10-CM

## 2012-01-29 DIAGNOSIS — Z0000 Encounter for general adult medical examination without abnormal findings: Secondary | ICD-10-CM

## 2012-01-29 DIAGNOSIS — E1142 Type 2 diabetes mellitus with diabetic polyneuropathy: Secondary | ICD-10-CM

## 2012-01-29 DIAGNOSIS — E119 Type 2 diabetes mellitus without complications: Secondary | ICD-10-CM

## 2012-01-29 DIAGNOSIS — E1149 Type 2 diabetes mellitus with other diabetic neurological complication: Secondary | ICD-10-CM

## 2012-01-29 MED ORDER — METFORMIN HCL 1000 MG PO TABS: 1000.0000 mg | ORAL_TABLET | ORAL | Status: DC

## 2012-01-29 MED ORDER — SILDENAFIL CITRATE 50 MG PO TABS: 50.0000 mg | ORAL_TABLET | ORAL | Status: DC | PRN

## 2012-01-29 MED ORDER — GLIPIZIDE 10 MG PO TABS: 10.0000 mg | ORAL_TABLET | ORAL | Status: DC

## 2012-01-29 MED ORDER — CITALOPRAM HYDROBROMIDE 20 MG PO TABS: 20.0000 mg | ORAL_TABLET | Freq: Every day | ORAL | Status: DC

## 2012-01-29 MED ORDER — LISINOPRIL 5 MG PO TABS: 5.0000 mg | ORAL_TABLET | ORAL | Status: DC

## 2012-01-29 NOTE — Patient Instructions (Addendum)
Continue your current medications  Followup in 6 months sooner if any problems 

## 2012-01-29 NOTE — Progress Notes (Signed)
  Subjective:    Patient ID: Jonathan Calhoun, male    DOB: 1950/07/15, 61 y.o.   MRN: [id number]  HPI Jonathan Calhoun is a 61 year old married male nonsmoker who comes in today for general physical examination  He takes Glucotrol 10 mg and metformin 1000 mg prior to breakfast blood sugar in the 05/31/2018 range A1c 6.1% occasional hypoglycemic episode but nothing more than once every month or 2. He takes lisinopril 5 mg daily for renal protection and blood pressure control BP 120/80  He takes Viagra 50 mg when necessary for ED   Review of Systems  Constitutional: Negative.   HENT: Negative.   Eyes: Negative.   Respiratory: Negative.   Cardiovascular: Negative.   Gastrointestinal: Negative.   Genitourinary: Negative.   Musculoskeletal: Negative.   Skin: Negative.   Neurological: Negative.   Hematological: Negative.   Psychiatric/Behavioral: Negative.    He now is home helping his 13 year old father and his mother-in-law who has early dementia his wife Jonathan Calhoun continues to work at the [employer] research [employer] son is going through a divorce daughter is doing well. Because of the stress with his father mother-in-law and now the divorce that his son is going through he would like to restart some Celexa. He took this 14 years ago when his son Jonathan Calhoun got killed in a motor vehicle accident      Objective:   Physical Exam  Constitutional: He is oriented to person, place, and time. He appears well-developed and well-nourished.  HENT:  Head: Normocephalic and atraumatic.  Right Ear: External ear normal.  Left Ear: External ear normal.  Nose: Nose normal.  Mouth/Throat: Oropharynx is clear and moist.  Eyes: Conjunctivae and EOM are normal. Pupils are equal, round, and reactive to light.  Neck: Normal range of motion. Neck supple. No JVD present. No tracheal deviation present. No thyromegaly present.  Cardiovascular: Normal rate, regular rhythm, normal heart sounds and intact distal pulses.  Exam reveals no  gallop and no friction rub.   No murmur heard. Pulmonary/Chest: Effort normal and breath sounds normal. No stridor. No respiratory distress. He has no wheezes. He has no rales. He exhibits no tenderness.  Abdominal: Soft. Bowel sounds are normal. He exhibits no distension and no mass. There is no tenderness. There is no rebound and no guarding.  Genitourinary: Rectum normal, prostate normal and penis normal. Guaiac negative stool. No penile tenderness.  Musculoskeletal: Normal range of motion. He exhibits no edema and no tenderness.  Lymphadenopathy:    He has no cervical adenopathy.  Neurological: He is alert and oriented to person, place, and time. He has normal reflexes. No cranial nerve deficit. He exhibits normal muscle tone.  Skin: Skin is warm and dry. No rash noted. No erythema. No pallor.  Psychiatric: He has a normal mood and affect. His behavior is normal. Judgment and thought content normal.          Assessment & Plan:  Healthy male  Diabetes type 2 under good control continue glipizide 10 mg daily along with metformin 1000 mg daily  Mild hypertension on lisinopril 5 mg daily for hypertension and renal protection  Erectile dysfunction continue Viagra  Mild depression begin Celexa 20 mg each bedtime call in 6 weeks with progress report

## 2012-07-22 ENCOUNTER — Other Ambulatory Visit: Payer: 59

## 2012-07-29 ENCOUNTER — Ambulatory Visit: Payer: 59 | Admitting: Family Medicine

## 2012-08-23 ENCOUNTER — Encounter: Payer: Self-pay | Admitting: Internal Medicine

## 2013-02-24 ENCOUNTER — Encounter: Payer: Self-pay | Admitting: Family Medicine

## 2013-02-24 ENCOUNTER — Ambulatory Visit (INDEPENDENT_AMBULATORY_CARE_PROVIDER_SITE_OTHER): Payer: 59 | Admitting: Family Medicine

## 2013-02-24 VITALS — BP 120/80 | Temp 98.0°F

## 2013-02-24 DIAGNOSIS — E119 Type 2 diabetes mellitus without complications: Secondary | ICD-10-CM

## 2013-02-24 DIAGNOSIS — M8560 Other cyst of bone, unspecified site: Secondary | ICD-10-CM

## 2013-02-24 DIAGNOSIS — M85661 Other cyst of bone, right lower leg: Secondary | ICD-10-CM

## 2013-02-24 DIAGNOSIS — Z23 Encounter for immunization: Secondary | ICD-10-CM

## 2013-02-24 NOTE — Patient Instructions (Signed)
Set up for fasting labs sometime in the next 2 weeks  Set up the first or second week in November for a complete evaluation

## 2013-02-24 NOTE — Progress Notes (Signed)
  Subjective:    Patient ID: Jonathan Calhoun, male    DOB: July 20, 1950, 62 y.o.   MRN: [id number]  HPI Hersel is a 62 year old male who comes in today for evaluation of a lump in his right groin  He notices lump in his right groin about 3 months ago. Her last week to 10 days has gotten smaller.  He stay for his annual physical examination  He states his medicines are okay his blood sugar is normal at home. Last A1c 15 months ago 6.5   Review of Systems    review of systems otherwise negative Objective:   Physical Exam Well-developed and nourished male no acute distress examination leg shows a pea-sized lesion left inner thigh. No connection to the skin no erythema       Assessment & Plan:  Cystic lesion resolving spontaneously reassured  Check labs because of diabetes

## 2013-03-14 ENCOUNTER — Other Ambulatory Visit: Payer: 59

## 2013-03-25 ENCOUNTER — Encounter: Payer: 59 | Admitting: Family Medicine

## 2013-04-04 ENCOUNTER — Other Ambulatory Visit: Payer: Self-pay | Admitting: Family Medicine

## 2013-12-08 ENCOUNTER — Telehealth: Payer: Self-pay | Admitting: *Deleted

## 2013-12-08 DIAGNOSIS — E119 Type 2 diabetes mellitus without complications: Secondary | ICD-10-CM

## 2013-12-08 DIAGNOSIS — E785 Hyperlipidemia, unspecified: Secondary | ICD-10-CM

## 2013-12-08 NOTE — Telephone Encounter (Signed)
Left message on machine for patient to schedule an office visit and lab appointment Lipid, a1c, bmet , micro albumin ordered Diabetic bundle

## 2014-01-02 NOTE — Telephone Encounter (Signed)
Left message on machine for patient to schedule an appointment to follow up diabetes

## 2014-02-02 ENCOUNTER — Other Ambulatory Visit (INDEPENDENT_AMBULATORY_CARE_PROVIDER_SITE_OTHER): Payer: 59

## 2014-02-02 DIAGNOSIS — E785 Hyperlipidemia, unspecified: Secondary | ICD-10-CM

## 2014-02-02 DIAGNOSIS — E119 Type 2 diabetes mellitus without complications: Secondary | ICD-10-CM

## 2014-02-02 DIAGNOSIS — Z0000 Encounter for general adult medical examination without abnormal findings: Secondary | ICD-10-CM

## 2014-02-02 LAB — HEPATIC FUNCTION PANEL
ALT: 36 U/L (ref 0–53)
AST: 26 U/L (ref 0–37)
Albumin: 3.6 g/dL (ref 3.5–5.2)
Alkaline Phosphatase: 78 U/L (ref 39–117)
Bilirubin, Direct: 0.2 mg/dL (ref 0.0–0.3)
Total Bilirubin: 1.2 mg/dL (ref 0.2–1.2)
Total Protein: 7.1 g/dL (ref 6.0–8.3)

## 2014-02-02 LAB — LIPID PANEL
CHOL/HDL RATIO: 5
Cholesterol: 216 mg/dL — ABNORMAL HIGH (ref 0–200)
HDL: 42.3 mg/dL (ref >39.00)
LDL CALC: 153 mg/dL — AB (ref 0–99)
NonHDL: 173.7
TRIGLYCERIDES: 106 mg/dL (ref 0.0–149.0)
VLDL: 21.2 mg/dL (ref 0.0–40.0)

## 2014-02-02 LAB — CBC WITH DIFFERENTIAL/PLATELET
Basophils Absolute: 0 10*3/uL (ref 0.0–0.1)
Basophils Relative: 0.3 % (ref 0.0–3.0)
EOS PCT: 1.1 % (ref 0.0–5.0)
Eosinophils Absolute: 0.1 10*3/uL (ref 0.0–0.7)
HCT: 44.9 % (ref 39.0–52.0)
HEMOGLOBIN: 15.2 g/dL (ref 13.0–17.0)
LYMPHS ABS: 3.1 10*3/uL (ref 0.7–4.0)
LYMPHS PCT: 27.9 % (ref 12.0–46.0)
MCHC: 33.8 g/dL (ref 30.0–36.0)
MCV: 91.2 fl (ref 78.0–100.0)
MONOS PCT: 8 % (ref 3.0–12.0)
Monocytes Absolute: 0.9 10*3/uL (ref 0.1–1.0)
NEUTROS ABS: 7 10*3/uL (ref 1.4–7.7)
Neutrophils Relative %: 62.7 % (ref 43.0–77.0)
PLATELETS: 201 10*3/uL (ref 150.0–400.0)
RBC: 4.92 Mil/uL (ref 4.22–5.81)
RDW: 14.2 % (ref 11.5–15.5)
WBC: 11.1 10*3/uL — AB (ref 4.0–10.5)

## 2014-02-02 LAB — POCT URINALYSIS DIPSTICK
BILIRUBIN UA: NEGATIVE
Blood, UA: NEGATIVE
KETONES UA: NEGATIVE
Leukocytes, UA: NEGATIVE
Nitrite, UA: NEGATIVE
PH UA: 6
Protein, UA: NEGATIVE
Spec Grav, UA: 1.01
Urobilinogen, UA: 0.2

## 2014-02-02 LAB — BASIC METABOLIC PANEL
BUN: 10 mg/dL (ref 6–23)
CALCIUM: 9.2 mg/dL (ref 8.4–10.5)
CO2: 27 mEq/L (ref 19–32)
CREATININE: 0.8 mg/dL (ref 0.4–1.5)
Chloride: 101 mEq/L (ref 96–112)
GFR: 104.88 mL/min (ref >60.00)
Glucose, Bld: 138 mg/dL — ABNORMAL HIGH (ref 70–99)
Potassium: 5 mEq/L (ref 3.5–5.1)
Sodium: 139 mEq/L (ref 135–145)

## 2014-02-02 LAB — MICROALBUMIN / CREATININE URINE RATIO
Creatinine,U: 139.6 mg/dL
MICROALB UR: 2.2 mg/dL — AB (ref 0.0–1.9)
Microalb Creat Ratio: 1.6 mg/g (ref 0.0–30.0)

## 2014-02-02 LAB — PSA: PSA: 0.15 ng/mL (ref 0.10–4.00)

## 2014-02-02 LAB — HEMOGLOBIN A1C: Hgb A1c MFr Bld: 7.8 % — ABNORMAL HIGH (ref 4.6–6.5)

## 2014-02-02 LAB — TSH: TSH: 1.28 u[IU]/mL (ref 0.35–4.50)

## 2014-02-09 ENCOUNTER — Encounter: Payer: 59 | Admitting: Family Medicine

## 2014-02-12 ENCOUNTER — Ambulatory Visit (INDEPENDENT_AMBULATORY_CARE_PROVIDER_SITE_OTHER): Payer: 59 | Admitting: Family Medicine

## 2014-02-12 ENCOUNTER — Encounter: Payer: Self-pay | Admitting: Family Medicine

## 2014-02-12 VITALS — BP 124/84 | Temp 98.7°F | Ht 79.5 in

## 2014-02-12 DIAGNOSIS — Z23 Encounter for immunization: Secondary | ICD-10-CM

## 2014-02-12 DIAGNOSIS — I10 Essential (primary) hypertension: Secondary | ICD-10-CM

## 2014-02-12 DIAGNOSIS — E1149 Type 2 diabetes mellitus with other diabetic neurological complication: Secondary | ICD-10-CM

## 2014-02-12 DIAGNOSIS — J309 Allergic rhinitis, unspecified: Secondary | ICD-10-CM

## 2014-02-12 DIAGNOSIS — E119 Type 2 diabetes mellitus without complications: Secondary | ICD-10-CM

## 2014-02-12 DIAGNOSIS — E785 Hyperlipidemia, unspecified: Secondary | ICD-10-CM

## 2014-02-12 MED ORDER — METFORMIN HCL 1000 MG PO TABS: ORAL_TABLET | ORAL | Status: DC

## 2014-02-12 MED ORDER — GLUCOSE BLOOD VI STRP: ORAL_STRIP | Status: DC

## 2014-02-12 MED ORDER — LISINOPRIL 5 MG PO TABS: ORAL_TABLET | ORAL | Status: DC

## 2014-02-12 MED ORDER — SILDENAFIL CITRATE 100 MG PO TABS: 50.0000 mg | ORAL_TABLET | Freq: Every day | ORAL | Status: DC | PRN

## 2014-02-12 MED ORDER — GLIPIZIDE 10 MG PO TABS: ORAL_TABLET | ORAL | Status: DC

## 2014-02-12 MED ORDER — CITALOPRAM HYDROBROMIDE 20 MG PO TABS: ORAL_TABLET | ORAL | Status: DC

## 2014-02-12 NOTE — Patient Instructions (Signed)
Glipizide 10 mg,,,,,,,,,,, 1 daily in the morning  Metformin 1000 mg,,,,,,,, one half tablet before breakfast,,,,,,, one half tablet before your evening male  Walk 30 minutes daily  Fasting blood sugar Monday Wednesday Friday  Return in 2 months for followup  Nonfasting labs one week prior,,,,,,,,,, 11:30 AM,,,,,,,,, or 4 PM  Soak and file and TRim your nails weekly  Eye exam yearly,

## 2014-02-12 NOTE — Progress Notes (Signed)
Pre visit review using our clinic review tool, if applicable. No additional management support is needed unless otherwise documented below in the visit note. Lab Results  Component Value Date   HGBA1C 7.8* 02/02/2014   HGBA1C 6.1 01/23/2012   HGBA1C 5.8 02/14/2011   Lab Results  Component Value Date   MICROALBUR 2.2* 02/02/2014   LDLCALC 153* 02/02/2014   CREATININE 0.8 02/02/2014

## 2014-02-12 NOTE — Progress Notes (Signed)
   Subjective:    Patient ID: Jonathan Calhoun, male    DOB: 1950/09/14, 63 y.o.   MRN: [id number]  HPI [name] is a 63 year old married male nonsmoker who comes in today for general physical examination. He has a history of depression which is treated with Celexa 20 mg daily.  He's got diabetes type 2 on glipizide 10 mg every morning metformin 1000 mg every morning. Weight is up 25 pounds in the past 2 years A1c up from 6.1-7.8%  He uses Viagra for ED  He takes 5 mg of lisinopril daily for blood pressure control BP 124/80.  He also takes an aspirin tablet daily  He gets routine eye care, dental care, colonoscopy and GI, vaccinations updated by [facility]ol[facility] Schneiders   Review of Systems  Constitutional: Negative.   HENT: Negative.   Eyes: Negative.   Respiratory: Negative.   Cardiovascular: Negative.   Gastrointestinal: Negative.   Genitourinary: Negative.   Musculoskeletal: Negative.   Skin: Negative.   Neurological: Negative.   Psychiatric/Behavioral: Negative.        Objective:   Physical Exam  Nursing note and vitals reviewed. Constitutional: He is oriented to person, place, and time. He appears well-developed and well-nourished.  HENT:  Head: Normocephalic and atraumatic.  Right Ear: External ear normal.  Left Ear: External ear normal.  Nose: Nose normal.  Mouth/Throat: Oropharynx is clear and moist.  Eyes: Conjunctivae and EOM are normal. Pupils are equal, round, and reactive to light.  Neck: Normal range of motion. Neck supple. No JVD present. No tracheal deviation present. No thyromegaly present.  Cardiovascular: Normal rate, regular rhythm, normal heart sounds and intact distal pulses.  Exam reveals no gallop and no friction rub.   No murmur heard. No carotid neurologic bruits peripheral pulses 2+ and symmetrical  Pulmonary/Chest: Effort normal and breath sounds normal. No stridor. No respiratory distress. He has no wheezes. He has no rales. He exhibits no tenderness.    Abdominal: Soft. Bowel sounds are normal. He exhibits no distension and no mass. There is no tenderness. There is no rebound and no guarding.  Ventral hernia  Genitourinary: Rectum normal, prostate normal and penis normal. Guaiac negative stool. No penile tenderness.  Musculoskeletal: Normal range of motion. He exhibits no edema and no tenderness.  Lymphadenopathy:    He has no cervical adenopathy.  Neurological: He is alert and oriented to person, place, and time. He has normal reflexes. No cranial nerve deficit. He exhibits normal muscle tone.  Skin: Skin is warm and dry. No rash noted. No erythema. No pallor.  Total body skin exam normal except for his toenails. The need to be trimmed and he has some with fundus need to be filed  Psychiatric: He has a normal mood and affect. His behavior is normal. Judgment and thought content normal.          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

## 2014-04-15 ENCOUNTER — Other Ambulatory Visit: Payer: 59

## 2014-04-20 ENCOUNTER — Ambulatory Visit: Payer: 59 | Admitting: Family Medicine

## 2014-11-04 ENCOUNTER — Encounter: Payer: Self-pay | Admitting: Adult Health

## 2014-11-04 ENCOUNTER — Ambulatory Visit (INDEPENDENT_AMBULATORY_CARE_PROVIDER_SITE_OTHER): Payer: 59 | Admitting: Adult Health

## 2014-11-04 VITALS — Temp 98.8°F | Ht 79.5 in | Wt 261.8 lb

## 2014-11-04 DIAGNOSIS — L259 Unspecified contact dermatitis, unspecified cause: Secondary | ICD-10-CM

## 2014-11-04 MED ORDER — PREDNISONE 20 MG PO TABS: 20.0000 mg | ORAL_TABLET | Freq: Every day | ORAL | Status: DC

## 2014-11-04 NOTE — Patient Instructions (Addendum)
You are having some type of allergic reaction, it does not completely look like poison ivy but it may just be in the early stages. Please take the prednisone as directed. If no improvement by Friday, please let me know.   Day 1 40 mg Day 2 40 mg Day 3 30 mg Day 4 30 mg Day 5 20 mg Day 6 10 mg Day 7 10 mg    Contact Dermatitis Contact dermatitis is a reaction to certain substances that touch the skin. Contact dermatitis can be either irritant contact dermatitis or allergic contact dermatitis. Irritant contact dermatitis does not require previous exposure to the substance for a reaction to occur.Allergic contact dermatitis only occurs if you have been exposed to the substance before. Upon a repeat exposure, your body reacts to the substance.  CAUSES  Many substances can cause contact dermatitis. Irritant dermatitis is most commonly caused by repeated exposure to mildly irritating substances, such as:  Makeup.  Soaps.  Detergents.  Bleaches.  Acids.  Metal salts, such as nickel. Allergic contact dermatitis is most commonly caused by exposure to:  Poisonous plants.  Chemicals (deodorants, shampoos).  Jewelry.  Latex.  Neomycin in triple antibiotic cream.  Preservatives in products, including clothing. SYMPTOMS  The area of skin that is exposed may develop:  Dryness or flaking.  Redness.  Cracks.  Itching.  Pain or a burning sensation.  Blisters. With allergic contact dermatitis, there may also be swelling in areas such as the eyelids, mouth, or genitals.  DIAGNOSIS  Your caregiver can usually tell what the problem is by doing a physical exam. In cases where the cause is uncertain and an allergic contact dermatitis is suspected, a patch skin test may be performed to help determine the cause of your dermatitis. TREATMENT Treatment includes protecting the skin from further contact with the irritating substance by avoiding that substance if possible. Barrier  creams, powders, and gloves may be helpful. Your caregiver may also recommend:  Steroid creams or ointments applied 2 times daily. For best results, soak the rash area in cool water for 20 minutes. Then apply the medicine. Cover the area with a plastic wrap. You can store the steroid cream in the refrigerator for a "chilly" effect on your rash. That may decrease itching. Oral steroid medicines may be needed in more severe cases.  Antibiotics or antibacterial ointments if a skin infection is present.  Antihistamine lotion or an antihistamine taken by mouth to ease itching.  Lubricants to keep moisture in your skin.  Burow's solution to reduce redness and soreness or to dry a weeping rash. Mix one packet or tablet of solution in 2 cups cool water. Dip a clean washcloth in the mixture, wring it out a bit, and put it on the affected area. Leave the cloth in place for 30 minutes. Do this as often as possible throughout the day.  Taking several cornstarch or baking soda baths daily if the area is too large to cover with a washcloth. Harsh chemicals, such as alkalis or acids, can cause skin damage that is like a burn. You should flush your skin for 15 to 20 minutes with cold water after such an exposure. You should also seek immediate medical care after exposure. Bandages (dressings), antibiotics, and pain medicine may be needed for severely irritated skin.  HOME CARE INSTRUCTIONS  Avoid the substance that caused your reaction.  Keep the area of skin that is affected away from hot water, soap, sunlight, chemicals, acidic substances, or anything  else that would irritate your skin.  Do not scratch the rash. Scratching may cause the rash to become infected.  You may take cool baths to help stop the itching.  Only take over-the-counter or prescription medicines as directed by your caregiver.  See your caregiver for follow-up care as directed to make sure your skin is healing properly. SEEK MEDICAL  CARE IF:   Your condition is not better after 3 days of treatment.  You seem to be getting worse.  You see signs of infection such as swelling, tenderness, redness, soreness, or warmth in the affected area.  You have any problems related to your medicines. Document Released: 05/05/2000 Document Revised: 07/31/2011 Document Reviewed: 10/11/2010 [facility] Patient Information 2015 [place], [place]. This information is not intended to replace advice given to you by your health care provider. Make sure you discuss any questions you have with your health care provider.

## 2014-11-04 NOTE — Progress Notes (Signed)
   Subjective:    Patient ID: Jonathan Calhoun, male    DOB: [birth year], 64 y.o.   MRN: [id number]  HPI Patient presents to the office for possible poison ivy contact. He was burning brush on Sunday and noticed that he had some poison ivy in the burn pile. He did not touch it with bare hands.    Monday morning when he work up he noticed that the area under his left eye was swollen and red. This area was slightly tender to touch. Denies any itching under his eye  Monday night he started to notice small red bumps around the top of his left eye. This area itches but has no drainage or crusting.   Denies any burning stinging pain. Area seems to be getting better. Has not tried anything OTC    Review of Systems  HENT: Positive for facial swelling.   Eyes: Negative for photophobia, pain, discharge, redness, itching and visual disturbance.  Skin: Positive for color change and rash.  Allergic/Immunologic: Negative for environmental allergies, food allergies and immunocompromised state.  Hematological: Negative for adenopathy. Does not bruise/bleed easily.  All other systems reviewed and are negative.      Objective:   Physical Exam  Constitutional: He is oriented to person, place, and time. He appears well-developed and well-nourished.  Eyes: Conjunctivae and EOM are normal. Pupils are equal, round, and reactive to light. Right eye exhibits no discharge. Left eye exhibits no discharge. No scleral icterus.  Neck: Normal range of motion. Neck supple.  Lymphadenopathy:    He has no cervical adenopathy.  Neurological: He is alert and oriented to person, place, and time.  Skin: Skin is warm and dry. Rash noted. He is not diaphoretic. There is erythema.  Red, slightly warm and tender to touch rash under left eye. Above left eye is a slightly red, vesicular, non crusted, non draining rash.   Psychiatric: He has a normal mood and affect. His behavior is normal. Thought content normal.  Vitals  reviewed.      Assessment & Plan:  1. Contact dermatitis - predniSONE (DELTASONE) 20 MG tablet; Take 1 tablet (20 mg total) by mouth daily with breakfast.  Dispense: 9 tablet; Refill: 0 - If no improvement in 2 days, please follow up - Go to ER with any changes in vision.

## 2014-11-04 NOTE — Progress Notes (Signed)
Pre visit review using our clinic review tool, if applicable. No additional management support is needed unless otherwise documented below in the visit note. 130/90

## 2014-11-18 ENCOUNTER — Telehealth: Payer: Self-pay | Admitting: *Deleted

## 2014-11-18 DIAGNOSIS — E119 Type 2 diabetes mellitus without complications: Secondary | ICD-10-CM

## 2014-11-18 NOTE — Telephone Encounter (Signed)
Left message on machine for patient to schedule a lab appointment a1c ordered Diabetic bundle

## 2015-05-04 ENCOUNTER — Other Ambulatory Visit: Payer: Self-pay | Admitting: Family Medicine

## 2015-05-18 LAB — HM DIABETES EYE EXAM

## 2015-05-20 ENCOUNTER — Encounter: Payer: Self-pay | Admitting: Family Medicine

## 2015-08-19 ENCOUNTER — Other Ambulatory Visit: Payer: Self-pay | Admitting: Family Medicine

## 2015-08-19 MED ORDER — CITALOPRAM HYDROBROMIDE 20 MG PO TABS: 20.0000 mg | ORAL_TABLET | Freq: Every day | ORAL | Status: DC

## 2015-08-19 MED ORDER — METFORMIN HCL 1000 MG PO TABS: 1000.0000 mg | ORAL_TABLET | Freq: Every morning | ORAL | Status: DC

## 2015-08-19 MED ORDER — GLIPIZIDE 10 MG PO TABS: 10.0000 mg | ORAL_TABLET | Freq: Every morning | ORAL | Status: DC

## 2015-08-19 MED ORDER — LISINOPRIL 5 MG PO TABS: 5.0000 mg | ORAL_TABLET | Freq: Every morning | ORAL | Status: DC

## 2015-08-19 MED FILL — LISINOPRIL 5 MG TABLET: 5 | 90 days supply | Qty: 90 | Fill #0

## 2015-08-19 MED FILL — glipiZIDE 10 MG TABS: 10 | 90 days supply | Qty: 90 | Fill #0

## 2015-08-19 MED FILL — metFORMIN HCL 1000 MG TABS: 1000 | 90 days supply | Qty: 90 | Fill #0

## 2015-08-19 MED FILL — CITALOPRAM HBR 20 MG TABLET: 20 | 90 days supply | Qty: 90 | Fill #0

## 2015-08-19 NOTE — Telephone Encounter (Signed)
Sent to the pharmacy by e-scribe.  Pt has upcoming CPX on 09-28-15

## 2015-09-14 ENCOUNTER — Other Ambulatory Visit (INDEPENDENT_AMBULATORY_CARE_PROVIDER_SITE_OTHER): Payer: Medicare Other

## 2015-09-14 DIAGNOSIS — E119 Type 2 diabetes mellitus without complications: Secondary | ICD-10-CM

## 2015-09-14 DIAGNOSIS — Z0000 Encounter for general adult medical examination without abnormal findings: Secondary | ICD-10-CM

## 2015-09-14 LAB — HEMOGLOBIN A1C: Hgb A1c MFr Bld: 6.7 % — ABNORMAL HIGH (ref 4.6–6.5)

## 2015-09-14 LAB — CBC WITH DIFFERENTIAL/PLATELET
BASOS PCT: 0.7 % (ref 0.0–3.0)
Basophils Absolute: 0.1 10*3/uL (ref 0.0–0.1)
EOS PCT: 2.9 % (ref 0.0–5.0)
Eosinophils Absolute: 0.2 10*3/uL (ref 0.0–0.7)
HCT: 46.1 % (ref 39.0–52.0)
HEMOGLOBIN: 15.8 g/dL (ref 13.0–17.0)
LYMPHS ABS: 3.4 10*3/uL (ref 0.7–4.0)
Lymphocytes Relative: 40.4 % (ref 12.0–46.0)
MCHC: 34.2 g/dL (ref 30.0–36.0)
MCV: 89.4 fl (ref 78.0–100.0)
MONO ABS: 0.6 10*3/uL (ref 0.1–1.0)
MONOS PCT: 7.7 % (ref 3.0–12.0)
Neutro Abs: 4 10*3/uL (ref 1.4–7.7)
Neutrophils Relative %: 48.3 % (ref 43.0–77.0)
Platelets: 236 10*3/uL (ref 150.0–400.0)
RBC: 5.15 Mil/uL (ref 4.22–5.81)
RDW: 14.8 % (ref 11.5–15.5)
WBC: 8.3 10*3/uL (ref 4.0–10.5)

## 2015-09-14 LAB — BASIC METABOLIC PANEL
BUN: 12 mg/dL (ref 6–23)
CHLORIDE: 101 meq/L (ref 96–112)
CO2: 30 mEq/L (ref 19–32)
Calcium: 9.7 mg/dL (ref 8.4–10.5)
Creatinine, Ser: 0.86 mg/dL (ref 0.40–1.50)
GFR: 94.61 mL/min (ref >60.00)
GLUCOSE: 150 mg/dL — AB (ref 70–99)
POTASSIUM: 4.8 meq/L (ref 3.5–5.1)
SODIUM: 136 meq/L (ref 135–145)

## 2015-09-14 LAB — POC URINALSYSI DIPSTICK (AUTOMATED)
BILIRUBIN UA: NEGATIVE
GLUCOSE UA: NEGATIVE
KETONES UA: NEGATIVE
Leukocytes, UA: NEGATIVE
NITRITE UA: NEGATIVE
PH UA: 6
Protein, UA: NEGATIVE
RBC UA: NEGATIVE
SPEC GRAV UA: 1.02
Urobilinogen, UA: 0.2

## 2015-09-14 LAB — MICROALBUMIN / CREATININE URINE RATIO
Creatinine,U: 157.3 mg/dL
Microalb Creat Ratio: 1.1 mg/g (ref 0.0–30.0)
Microalb, Ur: 1.7 mg/dL (ref 0.0–1.9)

## 2015-09-14 LAB — HEPATIC FUNCTION PANEL
ALBUMIN: 4.2 g/dL (ref 3.5–5.2)
ALT: 26 U/L (ref 0–53)
AST: 20 U/L (ref 0–37)
Alkaline Phosphatase: 85 U/L (ref 39–117)
BILIRUBIN TOTAL: 0.9 mg/dL (ref 0.2–1.2)
Bilirubin, Direct: 0.2 mg/dL (ref 0.0–0.3)
TOTAL PROTEIN: 6.9 g/dL (ref 6.0–8.3)

## 2015-09-14 LAB — LIPID PANEL
CHOL/HDL RATIO: 7
Cholesterol: 252 mg/dL — ABNORMAL HIGH (ref 0–200)
HDL: 38.3 mg/dL — AB (ref >39.00)
LDL CALC: 176 mg/dL — AB (ref 0–99)
NONHDL: 213.34
TRIGLYCERIDES: 188 mg/dL — AB (ref 0.0–149.0)
VLDL: 37.6 mg/dL (ref 0.0–40.0)

## 2015-09-14 LAB — TSH: TSH: 2.29 u[IU]/mL (ref 0.35–4.50)

## 2015-09-14 LAB — PSA: PSA: 0.17 ng/mL (ref 0.10–4.00)

## 2015-09-21 ENCOUNTER — Encounter: Payer: 59 | Admitting: Family Medicine

## 2015-09-28 ENCOUNTER — Ambulatory Visit (INDEPENDENT_AMBULATORY_CARE_PROVIDER_SITE_OTHER): Payer: Medicare Other | Admitting: Family Medicine

## 2015-09-28 ENCOUNTER — Encounter: Payer: Self-pay | Admitting: Family Medicine

## 2015-09-28 VITALS — BP 120/84 | Temp 98.6°F | Ht 73.0 in | Wt 258.0 lb

## 2015-09-28 DIAGNOSIS — F528 Other sexual dysfunction not due to a substance or known physiological condition: Secondary | ICD-10-CM | POA: Diagnosis not present

## 2015-09-28 DIAGNOSIS — E1169 Type 2 diabetes mellitus with other specified complication: Secondary | ICD-10-CM

## 2015-09-28 DIAGNOSIS — I10 Essential (primary) hypertension: Secondary | ICD-10-CM | POA: Diagnosis not present

## 2015-09-28 DIAGNOSIS — E785 Hyperlipidemia, unspecified: Secondary | ICD-10-CM

## 2015-09-28 DIAGNOSIS — E119 Type 2 diabetes mellitus without complications: Secondary | ICD-10-CM | POA: Insufficient documentation

## 2015-09-28 MED ORDER — LISINOPRIL 5 MG PO TABS: 5.0000 mg | ORAL_TABLET | Freq: Every morning | ORAL | Status: DC

## 2015-09-28 MED ORDER — SILDENAFIL CITRATE 20 MG PO TABS: 20.0000 mg | ORAL_TABLET | Freq: Every evening | ORAL | Status: DC | PRN

## 2015-09-28 MED ORDER — GLIPIZIDE 10 MG PO TABS: 10.0000 mg | ORAL_TABLET | Freq: Every morning | ORAL | Status: DC

## 2015-09-28 MED ORDER — METFORMIN HCL 1000 MG PO TABS: 1000.0000 mg | ORAL_TABLET | Freq: Every morning | ORAL | Status: DC

## 2015-09-28 MED ORDER — ATORVASTATIN CALCIUM 20 MG PO TABS: 20.0000 mg | ORAL_TABLET | Freq: Every day | ORAL | Status: DC

## 2015-09-28 MED ORDER — SILDENAFIL CITRATE 100 MG PO TABS: 50.0000 mg | ORAL_TABLET | Freq: Every day | ORAL | Status: DC | PRN

## 2015-09-28 MED ORDER — CITALOPRAM HYDROBROMIDE 20 MG PO TABS: 20.0000 mg | ORAL_TABLET | Freq: Every day | ORAL | Status: DC

## 2015-09-28 NOTE — Progress Notes (Signed)
Pre visit review using our clinic review tool, if applicable. No additional management support is needed unless otherwise documented below in the visit note. 

## 2015-09-28 NOTE — Patient Instructions (Signed)
Continue current medications  Restart the Lipitor 20 mg.........[name] Kitchen 1 tablet at bedtime along with a baby aspirin  Follow-up in September............ fasting labs one week prior  Continue carbohydrate free diet and walking program and weight loss

## 2015-09-28 NOTE — Progress Notes (Signed)
Subjective:    Patient ID: Jonathan Calhoun, male    DOB: [birth year], 65 y.o.   MRN: [id number]  HPI Jonathan Calhoun is a 65 year old married male nonsmoker who comes in today for general physical examination because of some underlying health issues  He takes Celexa 20 mg daily because of a history of mild depression  He takes Glucotrol 10 mg daily along with metformin 1000 mg daily. A1c is 6.6%  He takes lisinopril 5 mg daily for blood pressure BP 120/84  He uses Viagra when necessary for ED  He gets routine eye care, dental care, colonoscopy 2008 was normal but his mother had colon cancer. He also said they found some polyps. I asked him to call GI today and find out when he still for follow-up.  Vaccinations up-to-date  Cognitive function normal he walks daily home health safety reviewed no issues identified, no guns in the house, he does have a healthcare power of attorney and living well  Social history he's married he lives in [place]. His wife Jonathan Calhoun works at the [employer].  He also has a history of hyperlipidemia. We had him on lipid-lowering drug. His cholesterol normalized therefore stopped taking his medication. Advised to restarted and take it forever   Review of Systems  Constitutional: Negative.   HENT: Negative.   Eyes: Negative.   Respiratory: Negative.   Cardiovascular: Negative.   Gastrointestinal: Negative.   Endocrine: Negative.   Genitourinary: Negative.   Musculoskeletal: Negative.   Skin: Negative.   Allergic/Immunologic: Negative.   Neurological: Negative.   Hematological: Negative.   Psychiatric/Behavioral: Negative.        Objective:   Physical Exam  Constitutional: He is oriented to person, place, and time. He appears well-developed and well-nourished.  HENT:  Head: Normocephalic and atraumatic.  Right Ear: External ear normal.  Left Ear: External ear normal.  Nose: Nose normal.  Mouth/Throat:  Oropharynx is clear and moist.  Eyes: Conjunctivae and EOM are normal. Pupils are equal, round, and reactive to light.  Neck: Normal range of motion. Neck supple. No JVD present. No tracheal deviation present. No thyromegaly present.  Cardiovascular: Normal rate, regular rhythm, normal heart sounds and intact distal pulses.  Exam reveals no gallop and no friction rub.   No murmur heard. No carotid nor aortic bruits for couple pulses 2+ and symmetrical  Pulmonary/Chest: Effort normal and breath sounds normal. No stridor. No respiratory distress. He has no wheezes. He has no rales. He exhibits no tenderness.  Abdominal: Soft. Bowel sounds are normal. He exhibits no distension and no mass. There is no tenderness. There is no rebound and no guarding.  Genitourinary: Rectum normal, prostate normal and penis normal. Guaiac negative stool. No penile tenderness.  Musculoskeletal: Normal range of motion. He exhibits no edema or tenderness.  Lymphadenopathy:    He has no cervical adenopathy.  Neurological: He is alert and oriented to person, place, and time. He has normal reflexes. No cranial nerve deficit. He exhibits normal muscle tone.  Skin: Skin is warm and dry. No rash noted. No erythema. No pallor.  Total body skin exam normal he does have fungal infection of the toenails. Advised to soak in file weekly  Psychiatric: He has a normal mood and affect. His behavior is normal. Judgment and thought content normal.  Nursing note and vitals reviewed.         Assessment & Plan:  Hypertension at goal........ continue current therapy  History of mild  depression......... continue Celexa  Diabetes type 2.............. continue current medication  Hyperlipidemia.........[name] Kitchen restart Lipitor and aspirin.......... follow-up labs in September

## 2015-10-20 MED FILL — ATORVASTATIN 20 MG TABLET: 20 | 90 days supply | Qty: 90 | Fill #0

## 2015-11-08 ENCOUNTER — Encounter: Payer: Self-pay | Admitting: Gastroenterology

## 2015-11-15 ENCOUNTER — Ambulatory Visit (INDEPENDENT_AMBULATORY_CARE_PROVIDER_SITE_OTHER): Payer: Medicare Other | Admitting: Family Medicine

## 2015-11-15 ENCOUNTER — Encounter: Payer: Self-pay | Admitting: Family Medicine

## 2015-11-15 VITALS — BP 148/102 | HR 83 | Temp 98.4°F | Ht 73.0 in | Wt 258.1 lb

## 2015-11-15 DIAGNOSIS — W57XXXA Bitten or stung by nonvenomous insect and other nonvenomous arthropods, initial encounter: Secondary | ICD-10-CM | POA: Diagnosis not present

## 2015-11-15 DIAGNOSIS — L03319 Cellulitis of trunk, unspecified: Secondary | ICD-10-CM | POA: Diagnosis not present

## 2015-11-15 DIAGNOSIS — I10 Essential (primary) hypertension: Secondary | ICD-10-CM

## 2015-11-15 DIAGNOSIS — T148 Other injury of unspecified body region: Secondary | ICD-10-CM

## 2015-11-15 MED ORDER — CEPHALEXIN 500 MG PO CAPS: 500.0000 mg | ORAL_CAPSULE | Freq: Three times a day (TID) | ORAL | Status: DC

## 2015-11-15 NOTE — Patient Instructions (Signed)
Please take medication as directed and follow up if symptoms do not improve, worsen, you develop a fever >101, or notice red streaks from area surrounding bite.  Tick Bite Information Ticks are insects that attach themselves to the skin and draw blood for food. There are various types of ticks. Common types include wood ticks and deer ticks. Most ticks live in shrubs and grassy areas. Ticks can climb onto your body when you make contact with leaves or grass where the tick is waiting. The most common places on the body for ticks to attach themselves are the scalp, neck, armpits, waist, and groin. Most tick bites are harmless, but sometimes ticks carry germs that cause diseases. These germs can be spread to a person during the tick's feeding process. The chance of a disease spreading through a tick bite depends on:   The type of tick.  Time of year.   How long the tick is attached.   Geographic location.  HOW CAN YOU PREVENT TICK BITES? Take these steps to help prevent tick bites when you are outdoors:  Wear protective clothing. Long sleeves and long pants are best.   Wear white clothes so you can see ticks more easily.  Tuck your pant legs into your socks.   If walking on a trail, stay in the middle of the trail to avoid brushing against bushes.  Avoid walking through areas with long grass.  Put insect repellent on all exposed skin and along boot tops, pant legs, and sleeve cuffs.   Check clothing, hair, and skin repeatedly and before going inside.   Brush off any ticks that are not attached.  Take a shower or bath as soon as possible after being outdoors.  WHAT IS THE PROPER WAY TO REMOVE A TICK? Ticks should be removed as soon as possible to help prevent diseases caused by tick bites. 1. If latex gloves are available, put them on before trying to remove a tick.  2. Using fine-point tweezers, grasp the tick as close to the skin as possible. You may also use curved  forceps or a tick removal tool. Grasp the tick as close to its head as possible. Avoid grasping the tick on its body. 3. Pull gently with steady upward pressure until the tick lets go. Do not twist the tick or jerk it suddenly. This may break off the tick's head or mouth parts. 4. Do not squeeze or crush the tick's body. This could force disease-carrying fluids from the tick into your body.  5. After the tick is removed, wash the bite area and your hands with soap and water or other disinfectant such as alcohol. 6. Apply a small amount of antiseptic cream or ointment to the bite site.  7. Wash and disinfect any instruments that were used.  Do not try to remove a tick by applying a hot match, petroleum jelly, or fingernail polish to the tick. These methods do not work and may increase the chances of disease being spread from the tick bite.  WHEN SHOULD YOU SEEK MEDICAL CARE? Contact your health care provider if you are unable to remove a tick from your skin or if a part of the tick breaks off and is stuck in the skin.  After a tick bite, you need to be aware of signs and symptoms that could be related to diseases spread by ticks. Contact your health care provider if you develop any of the following in the days or weeks after the tick bite:  Unexplained fever.  Rash. A circular rash that appears days or weeks after the tick bite may indicate the possibility of Lyme disease. The rash may resemble a target with a bull's-eye and may occur at a different part of your body than the tick bite.  Redness and swelling in the area of the tick bite.   Tender, swollen lymph glands.   Diarrhea.   Weight loss.   Cough.   Fatigue.   Muscle, joint, or bone pain.   Abdominal pain.   Headache.   Lethargy or a change in your level of consciousness.  Difficulty walking or moving your legs.   Numbness in the legs.   Paralysis.  Shortness of breath.   Confusion.   Repeated  vomiting.    This information is not intended to replace advice given to you by your health care provider. Make sure you discuss any questions you have with your health care provider.   Document Released: 05/05/2000 Document Revised: 05/29/2014 Document Reviewed: 10/16/2012 Elsevier Interactive Patient Education [employer].

## 2015-11-15 NOTE — Progress Notes (Signed)
Subjective:    Patient ID: Jonathan Calhoun, male    DOB: 16-Jul-1950, 65 y.o.   MRN: [id number]  HPI  Jonathan Calhoun is a 65 year old male who presents today for evaluation of a tick bite.  He noticed the tick after mowing 5 days ago which he believes was attached 2 to 3 hours and was not engorged when removed.  Tick was removed by patient intact. Associated symptoms of pruritis present. He reports erythema that was noted 3 days after with clear "oozing" per patient report.  He reports that the "oozing" has stopped and eschar is now present. He describes the drainage as clear. Treatment at home with benedryl has provided moderate benefit.  Denies fever, chills, sweats, myalgia, and arthralgias.  Mild headache present the day of tick bite that has now resolved. No recent antibiotic use. Pertinent PMH include HTN, hyperlipidemia, and Type 2 diabetes.  Patient declined recheck of blood pressure. He reports that he has not taken his BP medication today but will do so when he returns home. He does not monitor his BP at home regularly but reports excellent Blood pressure control and his wife who is a nurse monitors this on a periodic basis. He denies chest pain, palpitations, SOB, dyspnea, headaches, nosebleeds, edema, and numbness, and tingling.  Suspect that this finding is related to improper cuff size which was noted to be used after visit completed and lack of current medication today.  Review of Systems  Constitutional: Negative for fever, chills, appetite change and fatigue.  HENT: Negative for nosebleeds.   Respiratory: Negative for cough and shortness of breath.   Cardiovascular: Negative for chest pain, palpitations and leg swelling.  Gastrointestinal: Negative for nausea, vomiting, abdominal pain and diarrhea.  Musculoskeletal: Negative for myalgias and arthralgias.  Skin:       Tick bite on left side of chest  Neurological: Negative for dizziness, light-headedness and headaches.   Past Medical  History  Diagnosis Date  . Allergy   . Hyperlipidemia   . Hypertension   . Diabetes mellitus     ORAL MEDS-NO INSULIN  . History of alcoholism ([place])   . History of shingles   . Ileus, postoperative     Post-op Chole Surgery     Social History   Social History  . Marital Status: Married    Spouse Name: N/A  . Number of Children: N/A  . Years of Education: N/A   Occupational History  . Not on file.   Social History Main Topics  . Smoking status: Current Every Day Smoker    Types: Cigars  . Smokeless tobacco: Not on file  . Alcohol Use: No     Comment: quit 3 yrs ago  . Drug Use: No  . Sexual Activity: Not on file   Other Topics Concern  . Not on file   Social History Narrative   Married, lives with spouse.   Plans for home after surgery.   Living will, healthcare POA.    Past Surgical History  Procedure Laterality Date  . Cardiac catheterization  2002  . Cholecystectomy    . Right shoulder surgery for tear and spur - yrs ago    . Total hip arthroplasty  03/29/2011    Procedure: TOTAL HIP ARTHROPLASTY;  Surgeon: Dione Plover Aluisio;  Location: [facility];  Service: Orthopedics;  Laterality: Right;  . Bone exostosis excision  08/10/2011    Procedure: EXOSTOSIS EXCISION;  Surgeon: Marcheta Grammes, DPM;  Location: [facility];  Service: Orthopedics;  Laterality: Left;  Exostectomy of Calcaneous Left Foot  . Synovectomy  08/10/2011    Procedure: SYNOVECTOMY;  Surgeon: Marcheta Grammes, DPM;  Location: [facility];  Service: Orthopedics;  Laterality: Left;  Synovectomy of Peroneal Tendon Left Foot  . Tendon repair  08/10/2011    Procedure: TENDON REPAIR;  Surgeon: Marcheta Grammes, DPM;  Location: [facility];  Service: Orthopedics;  Laterality: Left;  Repair of Peroneal Tendon Left Foot    Family History  Problem Relation Age of Onset  . Depression Mother   . Cancer Mother     colon  . Coronary artery disease Father   . Hypertension Father   . Heart failure Mother      No Known Allergies  Current Outpatient Prescriptions on File Prior to Visit  Medication Sig Dispense Refill  . atorvastatin (LIPITOR) 20 MG tablet Take 1 tablet (20 mg total) by mouth daily. 90 tablet 3  . citalopram (CELEXA) 20 MG tablet Take 1 tablet (20 mg total) by mouth daily. 100 tablet 3  . glipiZIDE (GLUCOTROL) 10 MG tablet Take 1 tablet (10 mg total) by mouth every morning. 100 tablet 3  . glucose blood test strip Use as instructed 100 each 3  . lisinopril (PRINIVIL,ZESTRIL) 5 MG tablet Take 1 tablet (5 mg total) by mouth every morning. 100 tablet 3  . metFORMIN (GLUCOPHAGE) 1000 MG tablet Take 1 tablet (1,000 mg total) by mouth every morning. 100 tablet 3  . ONETOUCH DELICA LANCETS MISC by Does not apply route.      . sildenafil (REVATIO) 20 MG tablet Take 1 tablet (20 mg total) by mouth at bedtime as needed. 10 tablet 11  . sildenafil (VIAGRA) 100 MG tablet Take 0.5-1 tablets (50-100 mg total) by mouth daily as needed for erectile dysfunction. 10 tablet 11  . [DISCONTINUED] rivaroxaban (XARELTO) 10 MG TABS tablet Take 1 tablet (10 mg total) by mouth daily. 18 tablet 0   No current facility-administered medications on file prior to visit.    BP 148/102 mmHg  Pulse 83  Temp(Src) 98.4 F (36.9 C) (Oral)  Ht 6\' 1"  (1.854 m)  Wt 258 lb 2 oz (117.085 kg)  BMI 34.06 kg/m2  SpO2 97%        Objective:   Physical Exam  Constitutional: He is oriented to person, place, and time. He appears well-developed.  Eyes: Pupils are equal, round, and reactive to light. No scleral icterus.  Cardiovascular: Normal rate, regular rhythm and intact distal pulses.   Pulmonary/Chest: Effort normal and breath sounds normal. He has no wheezes.  Abdominal: Soft. Bowel sounds are normal.  Musculoskeletal: He exhibits no edema.  Lymphadenopathy:    He has no cervical adenopathy.  Neurological: He is alert and oriented to person, place, and time.  Skin: Skin is warm and dry.  Eschar present,  surrounded by erythema, no fluctuance or drainage noted. No pain during exam per patient report  Psychiatric: He has a normal mood and affect. His behavior is normal. Judgment and thought content normal.              Assessment & Plan:  1. Tick bite Exam and history do not support prophylaxis antibiotic therapy as tick was not attached >36 hours and it has been removed >72 hours ago. Exam supports treatment for suspected resolving infection secondary to patient scratching area. Discussed tick bites, reasons for prophylaxis therapy, and preventive measures when working outside.  2. Cellulitis of trunk, unspecified  site Suspect drainage and eschar which has now formed is related to patient scratching area. Treat for nonpurulent cellulitis and advise follow up precautions which are noted below.   - cephALEXin (KEFLEX) 500 MG capsule; Take 1 capsule (500 mg total) by mouth 3 (three) times daily.  Dispense: 15 capsule; Refill: 0   3. Essential hypertension Advised patient to take his BP medication when he returns home. Also recommended that he monitor his BP and record averages and follow up with this PCP if BP is >150/90 with medication.  He agreed to do this and report readings and follow up as recommended. He has a physical scheduled with his PCP.  Advised patient that he will be treated due to possible infection from scratching that is now resolving. Discussed symptoms to monitor for including progression of rash, fever, myalgias, headache, or red streaks extending from site. Advised follow up if he notices any of these symptoms or area does not continue to improve. Patient voiced understanding and agreed with plan.  Delano Metz, FNP-C

## 2015-11-15 NOTE — Progress Notes (Signed)
Pre visit review using our clinic review tool, if applicable. No additional management support is needed unless otherwise documented below in the visit note. 

## 2015-12-02 MED FILL — glipiZIDE 10 MG TABS: 10 | 90 days supply | Qty: 90 | Fill #0

## 2015-12-02 MED FILL — metFORMIN HCL 1000 MG TABS: 1000 | 90 days supply | Qty: 90 | Fill #0

## 2015-12-02 MED FILL — LISINOPRIL 5 MG TABLET: 5 | 90 days supply | Qty: 90 | Fill #0

## 2015-12-02 MED FILL — CITALOPRAM HBR 20 MG TABLET: 20 | 90 days supply | Qty: 90 | Fill #0

## 2015-12-16 ENCOUNTER — Ambulatory Visit: Payer: Medicare Other

## 2015-12-16 VITALS — Ht 74.0 in | Wt 260.8 lb

## 2015-12-16 DIAGNOSIS — Z860100 Personal history of colon polyps, unspecified: Secondary | ICD-10-CM

## 2015-12-16 DIAGNOSIS — Z1211 Encounter for screening for malignant neoplasm of colon: Secondary | ICD-10-CM

## 2015-12-16 MED ORDER — POLYETHYLENE GLYCOL 3350 17 GM/SCOOP PO POWD: 1.0000 | Freq: Once | ORAL | 3 refills | Status: AC

## 2015-12-16 NOTE — Progress Notes (Signed)
No allergies to eggs or soy No past problems with anesthesia No diet meds No home oxygen  Has email and internet; declined emmi 

## 2015-12-21 ENCOUNTER — Encounter: Payer: Self-pay | Admitting: Gastroenterology

## 2015-12-21 ENCOUNTER — Ambulatory Visit (AMBULATORY_SURGERY_CENTER): Payer: Medicare Other | Admitting: Gastroenterology

## 2015-12-21 VITALS — BP 120/74 | HR 65 | Temp 97.8°F | Resp 14 | Ht 74.0 in | Wt 260.0 lb

## 2015-12-21 DIAGNOSIS — Z1211 Encounter for screening for malignant neoplasm of colon: Secondary | ICD-10-CM

## 2015-12-21 DIAGNOSIS — I10 Essential (primary) hypertension: Secondary | ICD-10-CM | POA: Diagnosis not present

## 2015-12-21 DIAGNOSIS — E119 Type 2 diabetes mellitus without complications: Secondary | ICD-10-CM | POA: Diagnosis not present

## 2015-12-21 DIAGNOSIS — Z800 Family history of malignant neoplasm of digestive organs: Secondary | ICD-10-CM | POA: Diagnosis not present

## 2015-12-21 HISTORY — PX: COLONOSCOPY: SHX174

## 2015-12-21 LAB — GLUCOSE, CAPILLARY
GLUCOSE-CAPILLARY: 152 mg/dL — AB (ref 65–99)
GLUCOSE-CAPILLARY: 120 mg/dL — AB (ref 65–99)

## 2015-12-21 MED ORDER — SODIUM CHLORIDE 0.9 % IV SOLN: 500.0000 mL | INTRAVENOUS | Status: DC

## 2015-12-21 NOTE — Patient Instructions (Signed)

## 2015-12-21 NOTE — Progress Notes (Signed)
Pt's paperwork had incorrect date on it.  He is to be a patient of Dr. Havery Moros.  He is here at [facility] today fully prepped.  Pt is ok to have another MD do his procedure.  Spoke with Dr. Havery Moros who is ok to have another MD do colonoscopy and Dr. Loletha Carrow who is ok to do the procedure.  Pt and wife aware and will be back at 10:30 for an 11:30 procedure.  Told he is ok to drink clear liquids until 8:30 a.m.

## 2015-12-21 NOTE — Op Note (Signed)
[facility] Patient Name: Jonathan Calhoun Procedure Date: 12/21/2015 11:49 AM MRN: [id number] Endoscopist: [place]. Loletha Carrow , MD Age: 65 Referring MD:  Date of Birth: 03-May-1951 Gender: Male Account #: [id number] Procedure:                Colonoscopy Indications:              Screening in patient at increased risk: Colorectal                            cancer in mother 45 or older Medicines:                Monitored Anesthesia Care Procedure:                Pre-Anesthesia Assessment:                           - Prior to the procedure, a History and Physical                            was performed, and patient medications and                            allergies were reviewed. The patient's tolerance of                            previous anesthesia was also reviewed. The risks                            and benefits of the procedure and the sedation                            options and risks were discussed with the patient.                            All questions were answered, and informed consent                            was obtained. Prior Anticoagulants: The patient has                            taken no previous anticoagulant or antiplatelet                            agents. ASA Grade Assessment: III - A patient with                            severe systemic disease. After reviewing the risks                            and benefits, the patient was deemed in                            satisfactory condition to undergo the procedure.  After obtaining informed consent, the colonoscope                            was passed under direct vision. Throughout the                            procedure, the patient's blood pressure, pulse, and                            oxygen saturations were monitored continuously. The                            Model CF-HQ190L [id number]) scope was introduced                            through the anus and advanced  to the the cecum,                            identified by appendiceal orifice and ileocecal                            valve. The colonoscopy was performed without                            difficulty. The patient tolerated the procedure                            well. The quality of the bowel preparation was                            good. The ileocecal valve, appendiceal orifice, and                            rectum were photographed. The bowel preparation                            used was SUPREP. Scope In: 11:54:11 AM Scope Out: [id number] PM Scope Withdrawal Time: 0 hours 9 minutes 16 seconds  Total Procedure Duration: 0 hours 13 minutes 6 seconds  Findings:                 The perianal and digital rectal examinations were                            normal.                           A few diverticula were found in the proximal                            transverse colon.                           Internal hemorrhoids were found during  retroflexion. The hemorrhoids were Grade I                            (internal hemorrhoids that do not prolapse).                           The exam was otherwise without abnormality. Complications:            No immediate complications. Estimated Blood Loss:     Estimated blood loss: none. Impression:               - Diverticulosis in the proximal transverse colon.                           - Internal hemorrhoids.                           - The examination was otherwise normal.                           - No specimens collected. Recommendation:           - Patient has a contact number available for                            emergencies. The signs and symptoms of potential                            delayed complications were discussed with the                            patient. Return to normal activities tomorrow.                            Written discharge instructions were provided to the                             patient.                           - Resume previous diet.                           - Continue present medications.                           - Repeat colonoscopy in 5 years for screening                            purposes. Henry L. Loletha Carrow, MD 12/21/2015 12:11:46 PM This report has been signed electronically.

## 2015-12-21 NOTE — Progress Notes (Signed)
To pacu vss patent aw reprot to rn 

## 2015-12-22 ENCOUNTER — Telehealth: Payer: Self-pay

## 2015-12-22 NOTE — Telephone Encounter (Signed)
  Follow up Call-  Call back number 12/21/2015  Post procedure Call Back phone  # [phone]  Permission to leave phone message Yes  Some recent data might be hidden    Patient was called for follow up after his procedure on 12/21/2015. No answer at the number given for follow up phone call. A message was left on the answering machine.

## 2015-12-30 ENCOUNTER — Encounter: Payer: Medicare Other | Admitting: Gastroenterology

## 2016-01-25 ENCOUNTER — Other Ambulatory Visit (INDEPENDENT_AMBULATORY_CARE_PROVIDER_SITE_OTHER): Payer: Medicare Other

## 2016-01-25 DIAGNOSIS — Z0000 Encounter for general adult medical examination without abnormal findings: Secondary | ICD-10-CM

## 2016-01-25 LAB — POC URINALSYSI DIPSTICK (AUTOMATED)
Bilirubin, UA: NEGATIVE
Blood, UA: NEGATIVE
Glucose, UA: NEGATIVE
KETONES UA: NEGATIVE
LEUKOCYTES UA: NEGATIVE
NITRITE UA: NEGATIVE
PH UA: 6
PROTEIN UA: NEGATIVE
Spec Grav, UA: 1.015
UROBILINOGEN UA: 1

## 2016-01-25 LAB — CBC WITH DIFFERENTIAL/PLATELET
BASOS PCT: 0.6 % (ref 0.0–3.0)
Basophils Absolute: 0.1 10*3/uL (ref 0.0–0.1)
EOS PCT: 2.1 % (ref 0.0–5.0)
Eosinophils Absolute: 0.2 10*3/uL (ref 0.0–0.7)
HCT: 43.5 % (ref 39.0–52.0)
Hemoglobin: 15.1 g/dL (ref 13.0–17.0)
LYMPHS ABS: 2.9 10*3/uL (ref 0.7–4.0)
Lymphocytes Relative: 32.3 % (ref 12.0–46.0)
MCHC: 34.6 g/dL (ref 30.0–36.0)
MCV: 90.4 fl (ref 78.0–100.0)
MONO ABS: 0.7 10*3/uL (ref 0.1–1.0)
Monocytes Relative: 7.8 % (ref 3.0–12.0)
NEUTROS ABS: 5.1 10*3/uL (ref 1.4–7.7)
NEUTROS PCT: 57.2 % (ref 43.0–77.0)
Platelets: 210 10*3/uL (ref 150.0–400.0)
RBC: 4.81 Mil/uL (ref 4.22–5.81)
RDW: 14.5 % (ref 11.5–15.5)
WBC: 8.8 10*3/uL (ref 4.0–10.5)

## 2016-01-25 LAB — BASIC METABOLIC PANEL
BUN: 12 mg/dL (ref 6–23)
CO2: 27 mEq/L (ref 19–32)
CREATININE: 0.82 mg/dL (ref 0.40–1.50)
Calcium: 9.5 mg/dL (ref 8.4–10.5)
Chloride: 103 mEq/L (ref 96–112)
GFR: 99.84 mL/min (ref >60.00)
GLUCOSE: 177 mg/dL — AB (ref 70–99)
POTASSIUM: 5.1 meq/L (ref 3.5–5.1)
Sodium: 135 mEq/L (ref 135–145)

## 2016-01-25 LAB — LIPID PANEL
CHOLESTEROL: 145 mg/dL (ref 0–200)
HDL: 39.1 mg/dL (ref >39.00)
LDL Cholesterol: 70 mg/dL (ref 0–99)
NonHDL: 106.32
TRIGLYCERIDES: 183 mg/dL — AB (ref 0.0–149.0)
Total CHOL/HDL Ratio: 4
VLDL: 36.6 mg/dL (ref 0.0–40.0)

## 2016-01-25 LAB — HEPATIC FUNCTION PANEL
ALT: 27 U/L (ref 0–53)
AST: 20 U/L (ref 0–37)
Albumin: 4.3 g/dL (ref 3.5–5.2)
Alkaline Phosphatase: 91 U/L (ref 39–117)
BILIRUBIN TOTAL: 0.8 mg/dL (ref 0.2–1.2)
Bilirubin, Direct: 0.2 mg/dL (ref 0.0–0.3)
Total Protein: 7 g/dL (ref 6.0–8.3)

## 2016-01-25 LAB — HEMOGLOBIN A1C: Hgb A1c MFr Bld: 7.1 % — ABNORMAL HIGH (ref 4.6–6.5)

## 2016-01-25 LAB — MICROALBUMIN / CREATININE URINE RATIO
CREATININE, U: 91.5 mg/dL
MICROALB/CREAT RATIO: 0.8 mg/g (ref 0.0–30.0)
Microalb, Ur: 0.7 mg/dL (ref 0.0–1.9)

## 2016-01-25 LAB — PSA: PSA: 0.17 ng/mL (ref 0.10–4.00)

## 2016-01-25 LAB — TSH: TSH: 1.75 u[IU]/mL (ref 0.35–4.50)

## 2016-01-31 ENCOUNTER — Ambulatory Visit (INDEPENDENT_AMBULATORY_CARE_PROVIDER_SITE_OTHER): Payer: Medicare Other | Admitting: Family Medicine

## 2016-01-31 ENCOUNTER — Encounter: Payer: Self-pay | Admitting: Family Medicine

## 2016-01-31 VITALS — BP 140/82 | HR 91 | Temp 98.3°F | Ht 74.0 in | Wt 262.0 lb

## 2016-01-31 DIAGNOSIS — E1169 Type 2 diabetes mellitus with other specified complication: Secondary | ICD-10-CM | POA: Diagnosis not present

## 2016-01-31 NOTE — Progress Notes (Signed)
Jonathan Calhoun is a 65 year old married male smoker,,,,,,,, 2 cigars per day,,,,,,,,,,, who comes in today for follow-up of obesity, diabetes type 2, hyperlipidemia  His blood sugar has risen in the 170 range. A1c is at 7.1%. He's on metformin 1000 mg daily along with the Glucotrol 10 mg daily. Weight is gone up 2 pounds. We talked about diet Eyes and weight loss. In the past she's gotten down to the to 25 range she's now 262. He'll work on that this fall. He's on Lipitor 20 mg daily along with a baby aspirin. Lipids over the past 4 months of present showed dramatic decrease with the medication and no alteration of his liver functions. LDL cholesterol is dropped from 176-70. Liver functions normal.  Physical examination vital signs stable he's afebrile weight 262 BMI 33.50  Peripheral pulses normal skin normal no neuropathy  Impression  #1 hypertension at goal......... Continue current therapy  #2 diabetes type 2 not at goal....... Continue current medicine work on diet exercise and weight loss. Follow-up A1c in 3 months  Obesity............[name] Kitchen Diet exercise and weight loss  #4 tobacco abuse 2 cigars daily...........[name] Kitchen Advised to stop completely

## 2016-01-31 NOTE — Patient Instructions (Signed)
No more cigars  Walk 30 minutes daily,,,,,,,,,,,,,,, resume your diet,,,,,,,,,,, carbohydrate free,,,,,,,,, no high fructose corn syrup  Return in 3 months for follow-up  Labs one week prior nonfasting

## 2016-01-31 NOTE — Progress Notes (Signed)
Pre visit review using our clinic review tool, if applicable. No additional management support is needed unless otherwise documented below in the visit note. 

## 2016-02-02 MED FILL — ATORVASTATIN 20 MG TABLET: 20 | 90 days supply | Qty: 90 | Fill #1

## 2016-02-29 MED FILL — glipiZIDE 10 MG TABS: 10 | 90 days supply | Qty: 90 | Fill #1

## 2016-02-29 MED FILL — metFORMIN HCL 1000 MG TABS: 1000 | 90 days supply | Qty: 90 | Fill #1

## 2016-02-29 MED FILL — LISINOPRIL 5 MG TABLET: 5 | 90 days supply | Qty: 90 | Fill #1

## 2016-02-29 MED FILL — CITALOPRAM HBR 20 MG TABLET: 20 | 90 days supply | Qty: 90 | Fill #1

## 2016-03-15 DIAGNOSIS — M1612 Unilateral primary osteoarthritis, left hip: Secondary | ICD-10-CM | POA: Diagnosis not present

## 2016-03-15 DIAGNOSIS — Z96641 Presence of right artificial hip joint: Secondary | ICD-10-CM | POA: Diagnosis not present

## 2016-03-15 DIAGNOSIS — Z471 Aftercare following joint replacement surgery: Secondary | ICD-10-CM | POA: Diagnosis not present

## 2016-04-06 DIAGNOSIS — M25552 Pain in left hip: Secondary | ICD-10-CM | POA: Diagnosis not present

## 2016-04-24 ENCOUNTER — Other Ambulatory Visit: Payer: Medicare Other

## 2016-05-01 ENCOUNTER — Ambulatory Visit: Payer: Medicare Other | Admitting: Family Medicine

## 2016-05-17 LAB — HM DIABETES EYE EXAM

## 2016-05-29 MED FILL — CITALOPRAM HBR 20 MG TABLET: 20 | 90 days supply | Qty: 90 | Fill #2

## 2016-05-29 MED FILL — glipiZIDE 10 MG TABS: 10 | 90 days supply | Qty: 90 | Fill #2

## 2016-05-29 MED FILL — ATORVASTATIN 20 MG TABLET: 20 | 90 days supply | Qty: 90 | Fill #2

## 2016-05-29 MED FILL — metFORMIN HCL 1000 MG TABS: 1000 | 90 days supply | Qty: 90 | Fill #2

## 2016-05-29 MED FILL — LISINOPRIL 5 MG TABLET: 5 | 90 days supply | Qty: 90 | Fill #2

## 2016-07-03 DIAGNOSIS — M25561 Pain in right knee: Secondary | ICD-10-CM | POA: Diagnosis not present

## 2016-07-03 MED FILL — traMADol HCL 50 MG TABS: 50 | 7 days supply | Qty: 30 | Fill #0

## 2016-07-18 DIAGNOSIS — M25561 Pain in right knee: Secondary | ICD-10-CM | POA: Diagnosis not present

## 2016-07-26 DIAGNOSIS — M25561 Pain in right knee: Secondary | ICD-10-CM | POA: Diagnosis not present

## 2016-08-03 DIAGNOSIS — S83261A Peripheral tear of lateral meniscus, current injury, right knee, initial encounter: Secondary | ICD-10-CM | POA: Diagnosis not present

## 2016-08-03 DIAGNOSIS — M25561 Pain in right knee: Secondary | ICD-10-CM | POA: Diagnosis not present

## 2016-09-20 MED FILL — ATORVASTATIN 20 MG TABLET: 20 | 90 days supply | Qty: 90 | Fill #3

## 2016-10-12 ENCOUNTER — Other Ambulatory Visit: Payer: Self-pay | Admitting: Family Medicine

## 2016-10-13 ENCOUNTER — Telehealth: Payer: Self-pay | Admitting: Family Medicine

## 2016-10-13 MED FILL — LISINOPRIL 5 MG TABLET: 5 | 90 days supply | Qty: 90 | Fill #0

## 2016-10-13 MED FILL — glipiZIDE 10 MG TABS: 10 | 90 days supply | Qty: 90 | Fill #0

## 2016-10-13 MED FILL — metFORMIN HCL 1000 MG TABS: 1000 | 90 days supply | Qty: 90 | Fill #0

## 2016-10-13 MED FILL — CITALOPRAM HBR 20 MG TABLET: 20 | 90 days supply | Qty: 90 | Fill #0

## 2016-10-13 NOTE — Telephone Encounter (Signed)
Filled for 90 days only.  Pt is past due for follow up of diabetes.  Message sent to scheduling.

## 2016-10-13 NOTE — Telephone Encounter (Signed)
lmom for pt to call back

## 2016-10-13 NOTE — Telephone Encounter (Signed)
Pt is past due for a follow up of diabetes.  Please help the pt to get on the schedule to see Dr. Sherren Mocha or another physician.  Thanks!!!

## 2016-10-17 NOTE — Telephone Encounter (Signed)
Pt has been sch w/cory

## 2016-10-24 ENCOUNTER — Ambulatory Visit (INDEPENDENT_AMBULATORY_CARE_PROVIDER_SITE_OTHER): Payer: Medicare Other | Admitting: Adult Health

## 2016-10-24 ENCOUNTER — Encounter: Payer: Self-pay | Admitting: Adult Health

## 2016-10-24 VITALS — BP 120/68 | Temp 98.0°F | Ht 74.0 in | Wt 264.7 lb

## 2016-10-24 DIAGNOSIS — E1169 Type 2 diabetes mellitus with other specified complication: Secondary | ICD-10-CM | POA: Diagnosis not present

## 2016-10-24 DIAGNOSIS — Z23 Encounter for immunization: Secondary | ICD-10-CM | POA: Diagnosis not present

## 2016-10-24 LAB — POCT GLYCOSYLATED HEMOGLOBIN (HGB A1C): HEMOGLOBIN A1C: 9.4

## 2016-10-24 NOTE — Addendum Note (Signed)
Addended by: Sandria Bales B on: 10/24/2016 08:07 AM   Modules accepted: Orders

## 2016-10-24 NOTE — Patient Instructions (Signed)
It was great seeing you today   Your A1c is up to 9.2 - need to work on diet and exercise   Follow up in three months for your annual exam as well as follow up with Manuela Schwartz for your Medical Wellness visit   .

## 2016-10-24 NOTE — Progress Notes (Signed)
Subjective:    Patient ID: Jonathan Calhoun, male    DOB: Nov 27, 1950, 66 y.o.   MRN: [id number]  HPI  66 year old male who  has a past medical history of Allergy; Diabetes mellitus; History of alcoholism ([place]); History of shingles; Hyperlipidemia; Hypertension; and Ileus, postoperative ([place]). He presents to the office today for follow up regarding diabetes and hypertension.   He is a patient of Dr. Sherren Mocha who I am seeing as he is out of the office. His diabetes is currently controlled with Metformin 100mg  and Glucotrol 10 mg. He was last seen by his PCP 9 months ago at which time his A1c was 7.1  His blood pressure is controlled with lisinopril 5 mg.. Today his blood pressure is 120/68 .  His weight is up 2 pounds since his last visit   Wt Readings from Last 3 Encounters:  10/24/16 264 lb 11.2 oz (120.1 kg)  01/31/16 262 lb (118.8 kg)  12/21/15 260 lb (117.9 kg)    Review of Systems  Constitutional: Negative.   HENT: Negative.   Respiratory: Negative.   Cardiovascular: Negative.   Gastrointestinal: Negative.   Genitourinary: Negative.   Neurological: Negative.   Hematological: Negative.   All other systems reviewed and are negative.  Past Medical History:  Diagnosis Date  . Allergy   . Diabetes mellitus    ORAL MEDS-NO INSULIN  . History of alcoholism ([place])   . History of shingles   . Hyperlipidemia   . Hypertension   . Ileus, postoperative ([place])    Post-op Chole Surgery    Social History   Social History  . Marital status: Married    Spouse name: N/A  . Number of children: N/A  . Years of education: N/A   Occupational History  . Not on file.   Social History Main Topics  . Smoking status: Current Every Day Smoker    Packs/day: 2.00    Types: Cigars  . Smokeless tobacco: Never Used  . Alcohol use No     Comment: quit 3 yrs ago  . Drug use: No  . Sexual activity: Not on file   Other Topics Concern  . Not on file   Social History Narrative   Married,  lives with spouse.   Plans for home after surgery.   Living will, healthcare POA.    Past Surgical History:  Procedure Laterality Date  . BONE EXOSTOSIS EXCISION  08/10/2011   Procedure: EXOSTOSIS EXCISION;  Surgeon: Marcheta Grammes, DPM;  Location: [facility];  Service: Orthopedics;  Laterality: Left;  Exostectomy of Calcaneous Left Foot  . CARDIAC CATHETERIZATION  2002  . CHOLECYSTECTOMY    . RIGHT SHOULDER SURGERY FOR TEAR AND SPUR - YRS AGO    . SYNOVECTOMY  08/10/2011   Procedure: SYNOVECTOMY;  Surgeon: Marcheta Grammes, DPM;  Location: [facility];  Service: Orthopedics;  Laterality: Left;  Synovectomy of Peroneal Tendon Left Foot  . TENDON REPAIR  08/10/2011   Procedure: TENDON REPAIR;  Surgeon: Marcheta Grammes, DPM;  Location: [facility];  Service: Orthopedics;  Laterality: Left;  Repair of Peroneal Tendon Left Foot  . TOTAL HIP ARTHROPLASTY  03/29/2011   Procedure: TOTAL HIP ARTHROPLASTY;  Surgeon: Dione Plover Aluisio;  Location: [facility];  Service: Orthopedics;  Laterality: Right;    Family History  Problem Relation Age of Onset  . Depression Mother   . Cancer Mother        colon  . Heart failure Mother   .  Colon cancer Mother   . Coronary artery disease Father   . Hypertension Father     No Known Allergies  Current Outpatient Prescriptions on File Prior to Visit  Medication Sig Dispense Refill  . atorvastatin (LIPITOR) 20 MG tablet Take 1 tablet (20 mg total) by mouth daily. 90 tablet 3  . bisacodyl (DULCOLAX) 5 MG EC tablet Take 10 mg by mouth once.    . citalopram (CELEXA) 20 MG tablet TAKE 1 TABLET BY MOUTH DAILY. 90 tablet 0  . glipiZIDE (GLUCOTROL) 10 MG tablet TAKE 1 TABLET BY MOUTH EVERY MORNING. 90 tablet 0  . glucose blood test strip Use as instructed 100 each 3  . lisinopril (PRINIVIL,ZESTRIL) 5 MG tablet TAKE 1 TABLET (5 MG TOTAL) BY MOUTH EVERY MORNING. 90 tablet 0  . metFORMIN (GLUCOPHAGE) 1000 MG tablet TAKE 1 TABLET (1,000 MG TOTAL) BY MOUTH EVERY  MORNING. 90 tablet 0  . ONETOUCH DELICA LANCETS MISC by Does not apply route.      . [DISCONTINUED] rivaroxaban (XARELTO) 10 MG TABS tablet Take 1 tablet (10 mg total) by mouth daily. 18 tablet 0   Current Facility-Administered Medications on File Prior to Visit  Medication Dose Route Frequency Provider Last Rate Last Dose  . 0.9 %  sodium chloride infusion  500 mL Intravenous Continuous Danis, Estill Cotta III, MD        BP 120/68 (BP Location: Left Arm, Patient Position: Sitting, Cuff Size: Normal)   Temp 98 F (36.7 C) (Oral)   Ht 6\' 2"  (1.88 m)   Wt 264 lb 11.2 oz (120.1 kg)   BMI 33.99 kg/m       Objective:   Physical Exam  Constitutional: He is oriented to person, place, and time. He appears well-developed and well-nourished. No distress.  Cardiovascular: Normal rate, regular rhythm, normal heart sounds and intact distal pulses.  Exam reveals no gallop and no friction rub.   No murmur heard. Pulmonary/Chest: Effort normal and breath sounds normal. No respiratory distress. He has no wheezes. He has no rales. He exhibits no tenderness.  Neurological: He is alert and oriented to person, place, and time.  Skin: Skin is warm and dry. No rash noted. He is not diaphoretic. No erythema. No pallor.  Psychiatric: He has a normal mood and affect. His behavior is normal. Judgment and thought content normal.  Nursing note and vitals reviewed.     Assessment & Plan:  1. Type 2 diabetes mellitus with other specified complication, unspecified whether long term insulin use (HCC) - POC HgB A1c - 9.2  - He does not want to go on any other medications at this time. He would like to work on diet and exercise as this has worked in the past. He understands his risks of elevated glucose levels - He and his wife have started a diet program and doing water aerobics.  - Follow up in 3 months for CPE and repeat A1c   [name], NP

## 2016-11-14 NOTE — Progress Notes (Signed)
Subjective:   Jonathan Calhoun is a 66 y.o. male who presents for an Initial Medicare Annual Wellness Visit.  The Patient was informed that the wellness visit is to identify future health risk and educate and initiate measures that can reduce risk for increased disease through the lifespan.   Describes health as fair, good or great? "It's good. I have things I need to work on-weight and controlling the diabetes.'  He retired from 39 years in [place] about 1.5 years ago. He is overall really enjoying retirement. 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  He has h/o alcoholism regularly attends AA meetings, which he is really proud of. He has been sober for 8 years and likes the opportunity to give back and support others in their journey towards sobriety.  Review of Systems  No ROS.  Medicare Wellness Visit. Additional risk factors are reflected in the social history.  Cardiac Risk Factors include: advanced age (>78men, >22 women);diabetes mellitus;dyslipidemia;hypertension;male gender;obesity (BMI >30kg/m2);smoking/ tobacco exposure  Sleep patterns: no sleep issues, feels rested on waking and gets up 2-3 times nightly to void.   Home Safety/Smoke Alarms: Feels safe in home. Smoke alarms in place.  Living environment; residence and Firearm Safety: Lives w/ wife, [name]. 1-story house/ trailer, firearms stored safely. Seat Belt Safety/Bike Helmet: Wears seat belt.   Counseling:   Eye Exam- Follows w/ [facility] in [place]. Last eye exam Dec. 2017.  Dental- Follows w/ Dr. Berdine Addison in [place] 1-2 times yearly.  Male:   CCS- last 12/21/15. Diverticulosis, internal hemorrhoids, otherwise normal. 5 year recall.      PSA-  Lab Results  Component Value Date   PSA 0.17 01/25/2016   PSA 0.17 09/14/2015   PSA 0.15 02/02/2014      Objective:    Today's Vitals   11/15/16 0857  BP: (!) 146/82  Pulse: 72  SpO2: 98%  Weight: 261 lb (118.4 kg)    Height: 6\' 2"  (1.88 m)   Body mass index is 33.51 kg/m.  Current Medications (verified) Outpatient Encounter Prescriptions as of 11/15/2016  Medication Sig  . atorvastatin (LIPITOR) 20 MG tablet Take 1 tablet (20 mg total) by mouth daily.  . bisacodyl (DULCOLAX) 5 MG EC tablet Take 10 mg by mouth once.  . citalopram (CELEXA) 20 MG tablet TAKE 1 TABLET BY MOUTH DAILY.  [name] Kitchen glipiZIDE (GLUCOTROL) 10 MG tablet TAKE 1 TABLET BY MOUTH EVERY MORNING.  [name] Kitchen glucose blood test strip Use as instructed  . lisinopril (PRINIVIL,ZESTRIL) 5 MG tablet TAKE 1 TABLET (5 MG TOTAL) BY MOUTH EVERY MORNING.  . metFORMIN (GLUCOPHAGE) 1000 MG tablet TAKE 1 TABLET (1,000 MG TOTAL) BY MOUTH EVERY MORNING.  Glory Rosebush DELICA LANCETS MISC by Does not apply route.    . [DISCONTINUED] 0.9 %  sodium chloride infusion    No facility-administered encounter medications on file as of 11/15/2016.     Allergies (verified) Patient has no known allergies.   History: Past Medical History:  Diagnosis Date  . Allergy   . Diabetes mellitus    ORAL MEDS-NO INSULIN  . History of alcoholism ([place])   . History of shingles   . Hyperlipidemia   . Hypertension   . Ileus, postoperative ([place])    Post-op Chole Surgery   Past Surgical History:  Procedure Laterality Date  . BONE EXOSTOSIS EXCISION  08/10/2011   Procedure: EXOSTOSIS EXCISION;  Surgeon: Marcheta Grammes, DPM;  Location: [facility];  Service:  Orthopedics;  Laterality: Left;  Exostectomy of Calcaneous Left Foot  . CARDIAC CATHETERIZATION  2002  . CHOLECYSTECTOMY    . RIGHT SHOULDER SURGERY FOR TEAR AND SPUR - YRS AGO    . SYNOVECTOMY  08/10/2011   Procedure: SYNOVECTOMY;  Surgeon: Marcheta Grammes, DPM;  Location: [facility];  Service: Orthopedics;  Laterality: Left;  Synovectomy of Peroneal Tendon Left Foot  . TENDON REPAIR  08/10/2011   Procedure: TENDON REPAIR;  Surgeon: Marcheta Grammes, DPM;  Location: [facility];  Service: Orthopedics;  Laterality: Left;   Repair of Peroneal Tendon Left Foot  . TOTAL HIP ARTHROPLASTY  03/29/2011   Procedure: TOTAL HIP ARTHROPLASTY;  Surgeon: Dione Plover Aluisio;  Location: [facility];  Service: Orthopedics;  Laterality: Right;   Family History  Problem Relation Age of Onset  . Depression Mother   . Cancer Mother        colon  . Heart failure Mother   . Colon cancer Mother   . Coronary artery disease Father   . Hypertension Father    Social History   Occupational History  . Not on file.   Social History Main Topics  . Smoking status: Current Every Day Smoker    Packs/day: 2.00    Types: Cigars  . Smokeless tobacco: Never Used  . Alcohol use No     Comment: quit 3 yrs ago  . Drug use: No  . Sexual activity: Not on file   Tobacco Counseling Ready to quit: Yes Counseling given: Yes   Activities of Daily Living In your present state of health, do you have any difficulty performing the following activities: 11/15/2016  Hearing? N  Vision? N  Difficulty concentrating or making decisions? N  Walking or climbing stairs? N  Dressing or bathing? N  Doing errands, shopping? N  Preparing Food and eating ? N  Using the Toilet? N  In the past six months, have you accidently leaked urine? N  Do you have problems with loss of bowel control? N  Managing your Medications? N  Managing your Finances? N  Housekeeping or managing your Housekeeping? N  Some recent data might be hidden    Immunizations and Health Maintenance Immunization History  Administered Date(s) Administered  . Influenza Split 02/14/2011, 01/29/2012  . Influenza Whole 05/22/2006  . Influenza,inj,Quad PF,36+ Mos 02/24/2013, 02/12/2014  . Pneumococcal Conjugate-13 02/12/2014  . Pneumococcal Polysaccharide-23 05/22/2000, 05/03/2007, 10/24/2016  . Td 05/22/1997  . Tdap 01/29/2012   Health Maintenance Due  Topic Date Due  . Hepatitis C Screening  [birth year]  . OPHTHALMOLOGY EXAM  05/17/2016    Patient Care Team: [name], NP as  PCP - General (Family Medicine)  Indicate any recent Medical Services you may have received from other than [facility] providers in the past year (date may be approximate).    Assessment:   This is a routine wellness examination for [place]. Physical assessment deferred to PCP.  Hearing/Vision screen Hearing Screening Comments: Able to hear conversational tones w/o difficulty. No issues reported. Passes whisper test at 6 feet. Vision Screening Comments: No vision issues. Follows w/ eye doctor in [place] once yearly in December.  Dietary issues and exercise activities discussed: Current Exercise Habits: The patient does not participate in regular exercise at present  Diet (meal preparation, eat out, water intake, caffeinated beverages, dairy products, fruits and vegetables): in general, a "healthy" diet  , well balanced, on average, 3 meals per day, diabetic. He is working to improve diet since A1c increased  at last visit. Trying to follow the plate method-1/2 plate veggies, small serving of lean protein, and small serving of carbs. Has mostly cut out desserts but occassionally has a bite size Dove chocolate. Drinks 1 diet soda daily-he has cut back from consuming about 2 L daily. Drinks Diet Green Tea and 2-3 large glasses of water daily. Breakfast: Cheerios w/ blueberries; sometimes has Egg McMuffin at [employer]  Lunch: varies-usually something quick and simple like canned tuna, beans, and crackers Dinner: varies     Goals    . Increase physical activity          Start Silver Sneakers 3 days weekly.    . Quit smoking / using tobacco          Planned quit date: December 31, 2016      Depression Screen PHQ 2/9 Scores 11/15/2016 09/28/2015  PHQ - 2 Score 0 0    Fall Risk Fall Risk  11/15/2016 09/28/2015  Falls in the past year? Yes No  Number falls in past yr: 1 -  Injury with Fall? Yes -  Follow up Falls prevention discussed -    Cognitive Function:       Ad8 score reviewed for  issues:  Issues making decisions: No  Less interest in hobbies / activities: No  Repeats questions, stories (family complaining): No  Trouble using ordinary gadgets (microwave, computer, phone): No  Forgets the month or year: No  Mismanaging finances: No  Remembering appts: No  Daily problems with thinking and/or memory: No Ad8 score is= 0  Screening Tests Health Maintenance  Topic Date Due  . Hepatitis C Screening  Mar 05, 1951  . OPHTHALMOLOGY EXAM  05/17/2016  . INFLUENZA VACCINE  12/20/2016  . HEMOGLOBIN A1C  04/25/2017  . FOOT EXAM  10/24/2017  . COLONOSCOPY  12/20/2020  . TETANUS/TDAP  01/28/2022  . PNA vac Low Risk Adult  Completed      Plan:    Follow-up w/ PCP as scheduled.  Bring a copy of your advance directives to your next office visit.  Consider the new shingles vaccine, Shingrix. This is available at most pharmacies.   Keep up the good work with diet and weight loss!  Hep C screening w/ next labs. Future orders placed.  Last diabetic eye exam requested from [facility] in [place]. Awaiting fax to [phone].  I have personally reviewed and noted the following in the patient's chart:   . Medical and social history . Use of alcohol, tobacco or illicit drugs  . Current medications and supplements . Functional ability and status . Nutritional status . Physical activity . Advanced directives . List of other physicians . Vitals . Screenings to include cognitive, depression, and falls . Referrals and appointments  In addition, I have reviewed and discussed with patient certain preventive protocols, quality metrics, and best practice recommendations. A written personalized care plan for preventive services as well as general preventive health recommendations were provided to patient.     Dorrene German, RN   11/15/2016

## 2016-11-15 ENCOUNTER — Ambulatory Visit (INDEPENDENT_AMBULATORY_CARE_PROVIDER_SITE_OTHER): Payer: Medicare Other

## 2016-11-15 VITALS — BP 146/82 | HR 72 | Ht 74.0 in | Wt 261.0 lb

## 2016-11-15 DIAGNOSIS — Z1159 Encounter for screening for other viral diseases: Secondary | ICD-10-CM | POA: Diagnosis not present

## 2016-11-15 DIAGNOSIS — Z0000 Encounter for general adult medical examination without abnormal findings: Secondary | ICD-10-CM

## 2016-11-15 NOTE — Patient Instructions (Addendum)
[name] , Thank you for taking time to come for your Medicare Wellness Visit. I appreciate your ongoing commitment to your health goals. Please review the following plan we discussed and let me know if I can assist you in the future.   Bring a copy of your advance directives to your next office visit.  Consider the new shingles vaccine, Shingrix. This is available at most pharmacies.   Keep up the good work with diet and weight loss!  These are the goals we discussed: Goals    . Increase physical activity          Start Silver Sneakers 3 days weekly.    . Quit smoking / using tobacco          Planned quit date: December 31, 2016    . Weight (lb) < 220 lb (99.8 kg) (pt-stated)          Goal weight: 220 lbs by next year       This is a list of the screening recommended for you and due dates:  Health Maintenance  Topic Date Due  .  Hepatitis C: One time screening is recommended by Center for Disease Control  (CDC) for  adults born from [birth year] through 1966.   [birth year]  . Eye exam for diabetics  05/17/2016  . Flu Shot  12/20/2016  . Hemoglobin A1C  04/25/2017  . Complete foot exam   10/24/2017  . Colon Cancer Screening  12/20/2020  . Tetanus Vaccine  01/28/2022  . Pneumonia vaccines  Completed    Preventive Care 65 Years and Older, Male Preventive care refers to lifestyle choices and visits with your health care provider that can promote health and wellness. What does preventive care include?  A yearly physical exam. This is also called an annual well check.  Dental exams once or twice a year.  Routine eye exams. Ask your health care provider how often you should have your eyes checked.  Personal lifestyle choices, including: ? Daily care of your teeth and gums. ? Regular physical activity. ? Eating a healthy diet. ? Avoiding tobacco and drug use. ? Limiting alcohol use. ? Practicing safe sex. ? Taking low doses of aspirin every day. ? Taking vitamin and mineral  supplements as recommended by your health care provider. What happens during an annual well check? The services and screenings done by your health care provider during your annual well check will depend on your age, overall health, lifestyle risk factors, and family history of disease. Counseling Your health care provider may ask you questions about your:  Alcohol use.  Tobacco use.  Drug use.  Emotional well-being.  Home and relationship well-being.  Sexual activity.  Eating habits.  History of falls.  Memory and ability to understand (cognition).  Work and work Statistician.  Screening You may have the following tests or measurements:  Height, weight, and BMI.  Blood pressure.  Lipid and cholesterol levels. These may be checked every 5 years, or more frequently if you are over 82 years old.  Skin check.  Lung cancer screening. You may have this screening every year starting at age 63 if you have a 30-pack-year history of smoking and currently smoke or have quit within the past 15 years.  Fecal occult blood test (FOBT) of the stool. You may have this test every year starting at age 61.  Flexible sigmoidoscopy or colonoscopy. You may have a sigmoidoscopy every 5 years or a colonoscopy every 10 years starting at  age 42.  Prostate cancer screening. Recommendations will vary depending on your family history and other risks.  Hepatitis C blood test.  Hepatitis B blood test.  Sexually transmitted disease (STD) testing.  Diabetes screening. This is done by checking your blood sugar (glucose) after you have not eaten for a while (fasting). You may have this done every 1-3 years.  Abdominal aortic aneurysm (AAA) screening. You may need this if you are a current or former smoker.  Osteoporosis. You may be screened starting at age 32 if you are at high risk.  Talk with your health care provider about your test results, treatment options, and if necessary, the need for  more tests. Vaccines Your health care provider may recommend certain vaccines, such as:  Influenza vaccine. This is recommended every year.  Tetanus, diphtheria, and acellular pertussis (Tdap, Td) vaccine. You may need a Td booster every 10 years.  Varicella vaccine. You may need this if you have not been vaccinated.  Zoster vaccine. You may need this after age 29.  Measles, mumps, and rubella (MMR) vaccine. You may need at least one dose of MMR if you were born in 1957 or later. You may also need a second dose.  Pneumococcal 13-valent conjugate (PCV13) vaccine. One dose is recommended after age 38.  Pneumococcal polysaccharide (PPSV23) vaccine. One dose is recommended after age 67.  Meningococcal vaccine. You may need this if you have certain conditions.  Hepatitis A vaccine. You may need this if you have certain conditions or if you travel or work in places where you may be exposed to hepatitis A.  Hepatitis B vaccine. You may need this if you have certain conditions or if you travel or work in places where you may be exposed to hepatitis B.  Haemophilus influenzae type b (Hib) vaccine. You may need this if you have certain risk factors.  Talk to your health care provider about which screenings and vaccines you need and how often you need them. This information is not intended to replace advice given to you by your health care provider. Make sure you discuss any questions you have with your health care provider. Document Released: 06/04/2015 Document Revised: 01/26/2016 Document Reviewed: 03/09/2015 Elsevier Interactive Patient Education  2017 [employer].

## 2016-11-15 NOTE — Progress Notes (Signed)
I have reviewed and agree with this plan  

## 2016-12-11 ENCOUNTER — Other Ambulatory Visit: Payer: Self-pay

## 2017-01-09 ENCOUNTER — Other Ambulatory Visit: Payer: Self-pay | Admitting: Family Medicine

## 2017-01-11 MED FILL — ATORVASTATIN 20 MG TABLET: 20 | 90 days supply | Qty: 90 | Fill #0

## 2017-01-11 MED FILL — glipiZIDE 10 MG TABS: 10 | 90 days supply | Qty: 90 | Fill #0

## 2017-01-11 MED FILL — metFORMIN HCL 1000 MG TABS: 1000 | 90 days supply | Qty: 90 | Fill #0

## 2017-01-11 MED FILL — LISINOPRIL 5 MG TABLET: 5 | 90 days supply | Qty: 90 | Fill #0

## 2017-01-11 MED FILL — CITALOPRAM HBR 20 MG TABLET: 20 | 90 days supply | Qty: 90 | Fill #0

## 2017-01-24 ENCOUNTER — Ambulatory Visit: Payer: Medicare Other | Admitting: Adult Health

## 2017-03-13 ENCOUNTER — Ambulatory Visit: Payer: Medicare Other | Admitting: Adult Health

## 2017-04-17 ENCOUNTER — Ambulatory Visit: Payer: Medicare Other | Admitting: Adult Health

## 2017-05-02 LAB — HM DIABETES EYE EXAM

## 2017-05-09 ENCOUNTER — Other Ambulatory Visit: Payer: Self-pay | Admitting: Adult Health

## 2017-05-09 MED FILL — ATORVASTATIN 20 MG TABLET: 20 | 90 days supply | Qty: 90 | Fill #1

## 2017-05-10 ENCOUNTER — Other Ambulatory Visit: Payer: Self-pay | Admitting: Adult Health

## 2017-05-10 MED ORDER — METFORMIN HCL 1000 MG PO TABS: 1000.0000 mg | ORAL_TABLET | Freq: Every morning | ORAL | 0 refills | Status: DC

## 2017-05-10 MED ORDER — CITALOPRAM HYDROBROMIDE 20 MG PO TABS: 20.0000 mg | ORAL_TABLET | Freq: Every day | ORAL | 1 refills | Status: DC

## 2017-05-10 MED ORDER — GLIPIZIDE 10 MG PO TABS: 10.0000 mg | ORAL_TABLET | Freq: Every morning | ORAL | 0 refills | Status: DC

## 2017-05-10 MED ORDER — LISINOPRIL 5 MG PO TABS: 5.0000 mg | ORAL_TABLET | Freq: Every morning | ORAL | 1 refills | Status: DC

## 2017-05-10 MED FILL — CITALOPRAM HBR 20 MG TABLET: 20 | 90 days supply | Qty: 90 | Fill #0

## 2017-05-10 MED FILL — metFORMIN HCL 1000 MG TABS: 1000 | 30 days supply | Qty: 30 | Fill #0

## 2017-05-10 MED FILL — glipiZIDE 10 MG TABS: 10 | 30 days supply | Qty: 30 | Fill #0

## 2017-05-10 MED FILL — LISINOPRIL 5 MG TABLET: 5 | 90 days supply | Qty: 90 | Fill #0

## 2017-05-10 NOTE — Telephone Encounter (Signed)
Jonathan Calhoun, you are listed as PCP but last note says this is a Jonathan Calhoun pt.  Please advise.

## 2017-05-16 ENCOUNTER — Encounter: Payer: Self-pay | Admitting: Family Medicine

## 2017-08-08 ENCOUNTER — Encounter: Payer: Self-pay | Admitting: Adult Health

## 2017-08-08 ENCOUNTER — Ambulatory Visit (INDEPENDENT_AMBULATORY_CARE_PROVIDER_SITE_OTHER): Payer: Medicare Other | Admitting: Adult Health

## 2017-08-08 DIAGNOSIS — E1169 Type 2 diabetes mellitus with other specified complication: Secondary | ICD-10-CM

## 2017-08-08 LAB — POCT GLYCOSYLATED HEMOGLOBIN (HGB A1C): HEMOGLOBIN A1C: 11.2

## 2017-08-08 MED ORDER — GLIPIZIDE 10 MG PO TABS: 10.0000 mg | ORAL_TABLET | Freq: Two times a day (BID) | ORAL | 0 refills | Status: DC

## 2017-08-08 MED ORDER — METFORMIN HCL 1000 MG PO TABS: 1000.0000 mg | ORAL_TABLET | Freq: Two times a day (BID) | ORAL | 0 refills | Status: DC

## 2017-08-08 MED FILL — glipiZIDE 10 MG TABS: 10 | 90 days supply | Qty: 180 | Fill #0

## 2017-08-08 MED FILL — metFORMIN HCL 1000 MG TABS: 1000 | 90 days supply | Qty: 180 | Fill #0

## 2017-08-08 NOTE — Patient Instructions (Signed)
Your A1c has increased to 11.2   I have increased Glipzide from 10 mf daily to 10 mg twice day   I have increased Metformin from daily to twice day   Follow up for physical in 2 months

## 2017-08-08 NOTE — Progress Notes (Signed)
Subjective:    Patient ID: Jonathan Calhoun, male    DOB: [birth year], 67 y.o.   MRN: [id number]  HPI  67 year old male who  has a past medical history of Allergy, Diabetes mellitus, History of alcoholism ([place]), History of shingles, Hyperlipidemia, Hypertension, and Ileus, postoperative ([place]). He presents to the office today for follow up regarding diabetes mellitus.  Currently maintained on glipizide 10 mg daily and metformin 1000 mg twice daily.  I last saw him approximately 9 months ago at which time his A1c was 9.4.  At this time he did not want to go on any additional medication and reported that he and his wife are working on lifestyle modifications.  In the office he reports that he has been out of his Metformin and Glipizide for the last month. He has not been exercising but has been " being mindful" of what he eats.   He has not been monitoring his blood sugars on a daily basis.   Wt Readings from Last 3 Encounters:  11/15/16 261 lb (118.4 kg)  10/24/16 264 lb 11.2 oz (120.1 kg)  01/31/16 262 lb (118.8 kg)    Review of Systems See HPI   Past Medical History:  Diagnosis Date  . Allergy   . Diabetes mellitus    ORAL MEDS-NO INSULIN  . History of alcoholism ([place])   . History of shingles   . Hyperlipidemia   . Hypertension   . Ileus, postoperative ([place])    Post-op Chole Surgery    Social History   Socioeconomic History  . Marital status: Married    Spouse name: Not on file  . Number of children: Not on file  . Years of education: Not on file  . Highest education level: Not on file  Social Needs  . Financial resource strain: Not on file  . Food insecurity - worry: Not on file  . Food insecurity - inability: Not on file  . Transportation needs - medical: Not on file  . Transportation needs - non-medical: Not on file  Occupational History  . Not on file  Tobacco Use  . Smoking status: Current Every Day Smoker    Packs/day: 2.00    Types: Cigars  . Smokeless  tobacco: Never Used  Substance and Sexual Activity  . Alcohol use: No    Comment: quit 3 yrs ago  . Drug use: No  . Sexual activity: Not on file  Other Topics Concern  . Not on file  Social History Narrative   Married, lives with spouse.   Plans for home after surgery.   Living will, healthcare POA.    Past Surgical History:  Procedure Laterality Date  . BONE EXOSTOSIS EXCISION  08/10/2011   Procedure: EXOSTOSIS EXCISION;  Surgeon: Marcheta Grammes, DPM;  Location: [facility];  Service: Orthopedics;  Laterality: Left;  Exostectomy of Calcaneous Left Foot  . CARDIAC CATHETERIZATION  2002  . CHOLECYSTECTOMY    . RIGHT SHOULDER SURGERY FOR TEAR AND SPUR - YRS AGO    . SYNOVECTOMY  08/10/2011   Procedure: SYNOVECTOMY;  Surgeon: Marcheta Grammes, DPM;  Location: [facility];  Service: Orthopedics;  Laterality: Left;  Synovectomy of Peroneal Tendon Left Foot  . TENDON REPAIR  08/10/2011   Procedure: TENDON REPAIR;  Surgeon: Marcheta Grammes, DPM;  Location: [facility];  Service: Orthopedics;  Laterality: Left;  Repair of Peroneal Tendon Left Foot  . TOTAL HIP ARTHROPLASTY  03/29/2011   Procedure: TOTAL HIP ARTHROPLASTY;  Surgeon: Dione Plover Aluisio;  Location: [facility];  Service: Orthopedics;  Laterality: Right;    Family History  Problem Relation Age of Onset  . Depression Mother   . Cancer Mother        colon  . Heart failure Mother   . Colon cancer Mother   . Coronary artery disease Father   . Hypertension Father     No Known Allergies  Current Outpatient Medications on File Prior to Visit  Medication Sig Dispense Refill  . atorvastatin (LIPITOR) 20 MG tablet TAKE 1 TABLET BY MOUTH DAILY. 90 tablet 3  . bisacodyl (DULCOLAX) 5 MG EC tablet Take 10 mg by mouth once.    . citalopram (CELEXA) 20 MG tablet Take 1 tablet (20 mg total) by mouth daily. 90 tablet 1  . glipiZIDE (GLUCOTROL) 10 MG tablet Take 1 tablet (10 mg total) by mouth every morning. Needs office visit for further  refills 30 tablet 0  . glucose blood test strip Use as instructed 100 each 3  . lisinopril (PRINIVIL,ZESTRIL) 5 MG tablet Take 1 tablet (5 mg total) by mouth every morning. 90 tablet 1  . metFORMIN (GLUCOPHAGE) 1000 MG tablet Take 1 tablet (1,000 mg total) by mouth every morning. Needs office visit for further refills 30 tablet 0  . ONETOUCH DELICA LANCETS MISC by Does not apply route.      . [DISCONTINUED] rivaroxaban (XARELTO) 10 MG TABS tablet Take 1 tablet (10 mg total) by mouth daily. 18 tablet 0   No current facility-administered medications on file prior to visit.     There were no vitals taken for this visit.      Objective:   Physical Exam  Constitutional: He is oriented to person, place, and time. He appears well-developed and well-nourished. No distress.  Cardiovascular: Normal rate, regular rhythm, normal heart sounds and intact distal pulses. Exam reveals no gallop and no friction rub.  No murmur heard. Pulmonary/Chest: Effort normal and breath sounds normal. No respiratory distress. He has no wheezes. He has no rales. He exhibits no tenderness.  Neurological: He is alert and oriented to person, place, and time.  Skin: Skin is warm and dry. No rash noted. He is not diaphoretic. No erythema. No pallor.  Psychiatric: He has a normal mood and affect. His behavior is normal. Judgment and thought content normal.  Nursing note and vitals reviewed.     Assessment & Plan:  1. Type 2 diabetes mellitus with other specified complication, unspecified whether long term insulin use (HCC)  - POC HgB A1c- has increased to 11.2  - Will increase Metformin to 1000 mg BID  - Will increase Glipzide to 10 mg BID  - Follow up in 2 months for CPE  - Follow up sooner if needed  - Monitor BS at home   [name], NP

## 2017-08-31 MED FILL — LISINOPRIL 5 MG TABLET: 5 | 90 days supply | Qty: 90 | Fill #1

## 2017-08-31 MED FILL — ATORVASTATIN 20 MG TABLET: 20 | 90 days supply | Qty: 90 | Fill #2

## 2017-08-31 MED FILL — CITALOPRAM HBR 20 MG TABLET: 20 | 90 days supply | Qty: 90 | Fill #1

## 2017-09-04 ENCOUNTER — Ambulatory Visit (INDEPENDENT_AMBULATORY_CARE_PROVIDER_SITE_OTHER): Payer: Medicare Other | Admitting: Adult Health

## 2017-09-04 ENCOUNTER — Encounter: Payer: Self-pay | Admitting: Adult Health

## 2017-09-04 VITALS — BP 160/90 | HR 73 | Temp 98.5°F

## 2017-09-04 DIAGNOSIS — L03211 Cellulitis of face: Secondary | ICD-10-CM

## 2017-09-04 MED ORDER — DOXYCYCLINE HYCLATE 100 MG PO CAPS: 100.0000 mg | ORAL_CAPSULE | Freq: Two times a day (BID) | ORAL | 0 refills | Status: DC

## 2017-09-04 MED FILL — DOXYCYCLINE HYCLATE 100 MG: 100 | 10 days supply | Qty: 20 | Fill #0

## 2017-09-04 NOTE — Progress Notes (Signed)
Subjective:    Patient ID: Jonathan Calhoun, male    DOB: March 04, 1951, 67 y.o.   MRN: [id number]  HPI 67 year old male who  has a past medical history of Allergy, Diabetes mellitus, History of alcoholism ([place]), History of shingles, Hyperlipidemia, Hypertension, and Ileus, postoperative ([place]).  He presents to the office today for an acute issue of redness, swelling and pain to left cheek under his eye. First noticed less than 24 hours ago. Denies any trauma. Denies any blurred vision   Review of Systems See HPI   Past Medical History:  Diagnosis Date  . Allergy   . Diabetes mellitus    ORAL MEDS-NO INSULIN  . History of alcoholism ([place])   . History of shingles   . Hyperlipidemia   . Hypertension   . Ileus, postoperative ([place])    Post-op Chole Surgery    Social History   Socioeconomic History  . Marital status: Married    Spouse name: Not on file  . Number of children: Not on file  . Years of education: Not on file  . Highest education level: Not on file  Occupational History  . Not on file  Social Needs  . Financial resource strain: Not on file  . Food insecurity:    Worry: Not on file    Inability: Not on file  . Transportation needs:    Medical: Not on file    Non-medical: Not on file  Tobacco Use  . Smoking status: Current Every Day Smoker    Packs/day: 2.00    Types: Cigars  . Smokeless tobacco: Never Used  Substance and Sexual Activity  . Alcohol use: No    Comment: quit 3 yrs ago  . Drug use: No  . Sexual activity: Not on file  Lifestyle  . Physical activity:    Days per week: Not on file    Minutes per session: Not on file  . Stress: Not on file  Relationships  . Social connections:    Talks on phone: Not on file    Gets together: Not on file    Attends religious service: Not on file    Active member of club or organization: Not on file    Attends meetings of clubs or organizations: Not on file    Relationship status: Not on file  . Intimate  partner violence:    Fear of current or ex partner: Not on file    Emotionally abused: Not on file    Physically abused: Not on file    Forced sexual activity: Not on file  Other Topics Concern  . Not on file  Social History Narrative   Married, lives with spouse.   Plans for home after surgery.   Living will, healthcare POA.    Past Surgical History:  Procedure Laterality Date  . BONE EXOSTOSIS EXCISION  08/10/2011   Procedure: EXOSTOSIS EXCISION;  Surgeon: Marcheta Grammes, DPM;  Location: [facility];  Service: Orthopedics;  Laterality: Left;  Exostectomy of Calcaneous Left Foot  . CARDIAC CATHETERIZATION  2002  . CHOLECYSTECTOMY    . RIGHT SHOULDER SURGERY FOR TEAR AND SPUR - YRS AGO    . SYNOVECTOMY  08/10/2011   Procedure: SYNOVECTOMY;  Surgeon: Marcheta Grammes, DPM;  Location: [facility];  Service: Orthopedics;  Laterality: Left;  Synovectomy of Peroneal Tendon Left Foot  . TENDON REPAIR  08/10/2011   Procedure: TENDON REPAIR;  Surgeon: Marcheta Grammes, DPM;  Location: [facility];  Service:  Orthopedics;  Laterality: Left;  Repair of Peroneal Tendon Left Foot  . TOTAL HIP ARTHROPLASTY  03/29/2011   Procedure: TOTAL HIP ARTHROPLASTY;  Surgeon: Dione Plover Aluisio;  Location: [facility];  Service: Orthopedics;  Laterality: Right;    Family History  Problem Relation Age of Onset  . Depression Mother   . Cancer Mother        colon  . Heart failure Mother   . Colon cancer Mother   . Coronary artery disease Father   . Hypertension Father     No Known Allergies  Current Outpatient Medications on File Prior to Visit  Medication Sig Dispense Refill  . atorvastatin (LIPITOR) 20 MG tablet TAKE 1 TABLET BY MOUTH DAILY. 90 tablet 3  . bisacodyl (DULCOLAX) 5 MG EC tablet Take 10 mg by mouth once.    . citalopram (CELEXA) 20 MG tablet Take 1 tablet (20 mg total) by mouth daily. 90 tablet 1  . glipiZIDE (GLUCOTROL) 10 MG tablet Take 1 tablet (10 mg total) by mouth 2 (two) times daily  before a meal. Needs office visit for further refills 180 tablet 0  . glucose blood test strip Use as instructed 100 each 3  . lisinopril (PRINIVIL,ZESTRIL) 5 MG tablet Take 1 tablet (5 mg total) by mouth every morning. 90 tablet 1  . metFORMIN (GLUCOPHAGE) 1000 MG tablet Take 1 tablet (1,000 mg total) by mouth 2 (two) times daily with a meal. Needs office visit for further refills 180 tablet 0  . ONETOUCH DELICA LANCETS MISC by Does not apply route.      . [DISCONTINUED] rivaroxaban (XARELTO) 10 MG TABS tablet Take 1 tablet (10 mg total) by mouth daily. 18 tablet 0   No current facility-administered medications on file prior to visit.     BP (!) 160/90 Comment: Pt has not had medication today  Pulse 73   Temp 98.5 F (36.9 C) (Oral)   SpO2 97%       Objective:   Physical Exam  Constitutional: He is oriented to person, place, and time. He appears well-developed and well-nourished. No distress.  Cardiovascular: Normal rate, regular rhythm, normal heart sounds and intact distal pulses. Exam reveals no gallop and no friction rub.  No murmur heard. Pulmonary/Chest: Effort normal and breath sounds normal. No respiratory distress. He has no wheezes. He has no rales. He exhibits no tenderness.  Neurological: He is alert and oriented to person, place, and time.  Skin: Skin is warm and dry. No rash noted. He is not diaphoretic. There is erythema. No pallor.  Redness, warmth, and swelling noted. Non fluctuant abscess felt to left cheek   Psychiatric: He has a normal mood and affect. His behavior is normal. Judgment and thought content normal.  Nursing note and vitals reviewed.     Assessment & Plan:  1. Cellulitis of face - No concern for shingles - doxycycline (VIBRAMYCIN) 100 MG capsule; Take 1 capsule (100 mg total) by mouth 2 (two) times daily.  Dispense: 20 capsule; Refill: 0 - Follow up if no improvement in the next 2-3 days  [name], NP

## 2017-09-12 DIAGNOSIS — M25521 Pain in right elbow: Secondary | ICD-10-CM | POA: Diagnosis not present

## 2017-10-09 ENCOUNTER — Encounter: Payer: Self-pay | Admitting: Adult Health

## 2017-10-09 ENCOUNTER — Ambulatory Visit (INDEPENDENT_AMBULATORY_CARE_PROVIDER_SITE_OTHER): Payer: Medicare Other | Admitting: Adult Health

## 2017-10-09 VITALS — BP 114/76 | Temp 98.1°F | Wt 238.0 lb

## 2017-10-09 DIAGNOSIS — Z1159 Encounter for screening for other viral diseases: Secondary | ICD-10-CM | POA: Diagnosis not present

## 2017-10-09 DIAGNOSIS — I10 Essential (primary) hypertension: Secondary | ICD-10-CM

## 2017-10-09 DIAGNOSIS — R351 Nocturia: Secondary | ICD-10-CM

## 2017-10-09 DIAGNOSIS — N401 Enlarged prostate with lower urinary tract symptoms: Secondary | ICD-10-CM | POA: Diagnosis not present

## 2017-10-09 DIAGNOSIS — E1169 Type 2 diabetes mellitus with other specified complication: Secondary | ICD-10-CM

## 2017-10-09 DIAGNOSIS — Z760 Encounter for issue of repeat prescription: Secondary | ICD-10-CM

## 2017-10-09 DIAGNOSIS — E782 Mixed hyperlipidemia: Secondary | ICD-10-CM

## 2017-10-09 LAB — CBC WITH DIFFERENTIAL/PLATELET
Basophils Absolute: 0 10*3/uL (ref 0.0–0.1)
Basophils Relative: 0.5 % (ref 0.0–3.0)
EOS PCT: 1.6 % (ref 0.0–5.0)
Eosinophils Absolute: 0.1 10*3/uL (ref 0.0–0.7)
HCT: 45.5 % (ref 39.0–52.0)
Hemoglobin: 15.7 g/dL (ref 13.0–17.0)
LYMPHS ABS: 2.2 10*3/uL (ref 0.7–4.0)
Lymphocytes Relative: 28.6 % (ref 12.0–46.0)
MCHC: 34.5 g/dL (ref 30.0–36.0)
MCV: 90.7 fl (ref 78.0–100.0)
MONO ABS: 0.6 10*3/uL (ref 0.1–1.0)
Monocytes Relative: 7.8 % (ref 3.0–12.0)
NEUTROS PCT: 61.5 % (ref 43.0–77.0)
Neutro Abs: 4.8 10*3/uL (ref 1.4–7.7)
PLATELETS: 214 10*3/uL (ref 150.0–400.0)
RBC: 5.02 Mil/uL (ref 4.22–5.81)
RDW: 15.3 % (ref 11.5–15.5)
WBC: 7.8 10*3/uL (ref 4.0–10.5)

## 2017-10-09 LAB — BASIC METABOLIC PANEL
BUN: 12 mg/dL (ref 6–23)
CALCIUM: 10.2 mg/dL (ref 8.4–10.5)
CO2: 27 mEq/L (ref 19–32)
Chloride: 102 mEq/L (ref 96–112)
Creatinine, Ser: 0.85 mg/dL (ref 0.40–1.50)
GFR: 95.3 mL/min (ref >60.00)
GLUCOSE: 133 mg/dL — AB (ref 70–99)
POTASSIUM: 5.1 meq/L (ref 3.5–5.1)
Sodium: 139 mEq/L (ref 135–145)

## 2017-10-09 LAB — HEPATIC FUNCTION PANEL
ALK PHOS: 90 U/L (ref 39–117)
ALT: 21 U/L (ref 0–53)
AST: 18 U/L (ref 0–37)
Albumin: 4.4 g/dL (ref 3.5–5.2)
Bilirubin, Direct: 0.3 mg/dL (ref 0.0–0.3)
TOTAL PROTEIN: 7.1 g/dL (ref 6.0–8.3)
Total Bilirubin: 1.2 mg/dL (ref 0.2–1.2)

## 2017-10-09 LAB — LIPID PANEL
CHOLESTEROL: 117 mg/dL (ref 0–200)
HDL: 38.1 mg/dL — ABNORMAL LOW (ref >39.00)
LDL Cholesterol: 55 mg/dL (ref 0–99)
NONHDL: 79.36
Total CHOL/HDL Ratio: 3
Triglycerides: 121 mg/dL (ref 0.0–149.0)
VLDL: 24.2 mg/dL (ref 0.0–40.0)

## 2017-10-09 LAB — PSA: PSA: 0.21 ng/mL (ref 0.10–4.00)

## 2017-10-09 LAB — HEMOGLOBIN A1C: HEMOGLOBIN A1C: 7.4 % — AB (ref 4.6–6.5)

## 2017-10-09 LAB — TSH: TSH: 1.8 u[IU]/mL (ref 0.35–4.50)

## 2017-10-09 MED ORDER — GLIPIZIDE 5 MG PO TABS: 5.0000 mg | ORAL_TABLET | Freq: Two times a day (BID) | ORAL | 0 refills | Status: DC

## 2017-10-09 MED ORDER — METFORMIN HCL 1000 MG PO TABS: 1000.0000 mg | ORAL_TABLET | Freq: Two times a day (BID) | ORAL | 0 refills | Status: DC

## 2017-10-09 MED ORDER — CITALOPRAM HYDROBROMIDE 20 MG PO TABS: 20.0000 mg | ORAL_TABLET | Freq: Every day | ORAL | 1 refills | Status: DC

## 2017-10-09 MED ORDER — LISINOPRIL 5 MG PO TABS: 5.0000 mg | ORAL_TABLET | Freq: Every morning | ORAL | 3 refills | Status: DC

## 2017-10-09 NOTE — Progress Notes (Signed)
Subjective:    Patient ID: Jonathan Calhoun, male    DOB: 1950-08-29, 67 y.o.   MRN: [id number]  HPI Patient presents for yearly follow up examination. He is a pleasant 67 year old male who  has a past medical history of Allergy, Diabetes mellitus, History of alcoholism ([place]), History of shingles, Hyperlipidemia, Hypertension, and Ileus, postoperative ([place]).  Diabetes Mellitus -currently prescribed glipizide 10 mg BID and metformin 1000 mg twice daily.  Does not check his blood sugars on a regular basis, he reports that when he checks his average of 115.   He has been trying to work on lifestyle modifications during his last visit A1c had increased from 9.4 to 11.2. Today in the office he reports that he has changed his diet, he is eating low carb/no carb, no sugars, and drinking lots of water. He has had two episodes of hypoglycemia  Hyperlipidemia -prescribed Lipitor 20 mg daily.  Lab Results  Component Value Date   CHOL 145 01/25/2016   HDL 39.10 01/25/2016   LDLCALC 70 01/25/2016   TRIG 183.0 (H) 01/25/2016   CHOLHDL 4 01/25/2016   Hypertension - Takes lisinopril 5 mg. BP has been slowly creeping up. He does not monitor his BP at home on a regular basis.   BP Readings from Last 3 Encounters:  10/09/17 114/76  09/04/17 (!) 160/90  11/15/16 (!) 146/82   Anxiety - Controlled with Celexa 20 mg   All immunizations and health maintenance protocols were reviewed with the patient and needed orders were placed.  He is up-to-date on vaccinations  Appropriate screening laboratory values were ordered for the patient including screening of hyperlipidemia, renal function and hepatic function. If indicated by BPH, a PSA was ordered.  Medication reconciliation,  past medical history, social history, problem list and allergies were reviewed in detail with the patient  Goals were established with regard to weight loss, exercise, and  diet in compliance with medications Wt Readings from Last 3  Encounters:  10/09/17 238 lb (108 kg)  11/15/16 261 lb (118.4 kg)  10/24/16 264 lb 11.2 oz (120.1 kg)   End of life planning was discussed. He has an advanced directive and living will.  He is up-to-date on routine health maintenance items such as dental and vision screens as well has colonoscopy.  He has no acute complaints.    Review of Systems  Constitutional: Negative.   HENT: Negative.   Eyes: Negative.   Respiratory: Negative.   Cardiovascular: Negative.   Gastrointestinal: Negative.   Endocrine: Negative.   Genitourinary: Negative.   Musculoskeletal: Negative.   Skin: Negative.   Allergic/Immunologic: Negative.   Neurological: Negative.   Hematological: Negative.   Psychiatric/Behavioral: Negative.   All other systems reviewed and are negative.  Past Medical History:  Diagnosis Date  . Allergy   . Diabetes mellitus    ORAL MEDS-NO INSULIN  . History of alcoholism ([place])   . History of shingles   . Hyperlipidemia   . Hypertension   . Ileus, postoperative ([place])    Post-op Chole Surgery    Social History   Socioeconomic History  . Marital status: Married    Spouse name: Not on file  . Number of children: Not on file  . Years of education: Not on file  . Highest education level: Not on file  Occupational History  . Not on file  Social Needs  . Financial resource strain: Not on file  . Food insecurity:    Worry: Not  on file    Inability: Not on file  . Transportation needs:    Medical: Not on file    Non-medical: Not on file  Tobacco Use  . Smoking status: Current Every Day Smoker    Packs/day: 2.00    Types: Cigars  . Smokeless tobacco: Never Used  Substance and Sexual Activity  . Alcohol use: No    Comment: quit 3 yrs ago  . Drug use: No  . Sexual activity: Not on file  Lifestyle  . Physical activity:    Days per week: Not on file    Minutes per session: Not on file  . Stress: Not on file  Relationships  . Social connections:    Talks on  phone: Not on file    Gets together: Not on file    Attends religious service: Not on file    Active member of club or organization: Not on file    Attends meetings of clubs or organizations: Not on file    Relationship status: Not on file  . Intimate partner violence:    Fear of current or ex partner: Not on file    Emotionally abused: Not on file    Physically abused: Not on file    Forced sexual activity: Not on file  Other Topics Concern  . Not on file  Social History Narrative   Married, lives with spouse.   Plans for home after surgery.   Living will, healthcare POA.    Past Surgical History:  Procedure Laterality Date  . BONE EXOSTOSIS EXCISION  08/10/2011   Procedure: EXOSTOSIS EXCISION;  Surgeon: Marcheta Grammes, DPM;  Location: [facility];  Service: Orthopedics;  Laterality: Left;  Exostectomy of Calcaneous Left Foot  . CARDIAC CATHETERIZATION  2002  . CHOLECYSTECTOMY    . RIGHT SHOULDER SURGERY FOR TEAR AND SPUR - YRS AGO    . SYNOVECTOMY  08/10/2011   Procedure: SYNOVECTOMY;  Surgeon: Marcheta Grammes, DPM;  Location: [facility];  Service: Orthopedics;  Laterality: Left;  Synovectomy of Peroneal Tendon Left Foot  . TENDON REPAIR  08/10/2011   Procedure: TENDON REPAIR;  Surgeon: Marcheta Grammes, DPM;  Location: [facility];  Service: Orthopedics;  Laterality: Left;  Repair of Peroneal Tendon Left Foot  . TOTAL HIP ARTHROPLASTY  03/29/2011   Procedure: TOTAL HIP ARTHROPLASTY;  Surgeon: Dione Plover Aluisio;  Location: [facility];  Service: Orthopedics;  Laterality: Right;    Family History  Problem Relation Age of Onset  . Depression Mother   . Cancer Mother        colon  . Heart failure Mother   . Colon cancer Mother   . Coronary artery disease Father   . Hypertension Father     No Known Allergies  Current Outpatient Medications on File Prior to Visit  Medication Sig Dispense Refill  . atorvastatin (LIPITOR) 20 MG tablet TAKE 1 TABLET BY MOUTH DAILY. 90 tablet 3    . bisacodyl (DULCOLAX) 5 MG EC tablet Take 10 mg by mouth once.    . citalopram (CELEXA) 20 MG tablet Take 1 tablet (20 mg total) by mouth daily. 90 tablet 1  . glipiZIDE (GLUCOTROL) 10 MG tablet Take 1 tablet (10 mg total) by mouth 2 (two) times daily before a meal. Needs office visit for further refills 180 tablet 0  . glucose blood test strip Use as instructed 100 each 3  . lisinopril (PRINIVIL,ZESTRIL) 5 MG tablet Take 1 tablet (5 mg total) by mouth every  morning. 90 tablet 1  . metFORMIN (GLUCOPHAGE) 1000 MG tablet Take 1 tablet (1,000 mg total) by mouth 2 (two) times daily with a meal. Needs office visit for further refills 180 tablet 0  . ONETOUCH DELICA LANCETS MISC by Does not apply route.      . [DISCONTINUED] rivaroxaban (XARELTO) 10 MG TABS tablet Take 1 tablet (10 mg total) by mouth daily. 18 tablet 0   No current facility-administered medications on file prior to visit.     BP 114/76   Temp 98.1 F (36.7 C) (Oral)   Wt 238 lb (108 kg)   BMI 30.56 kg/m       Objective:   Physical Exam  Constitutional: He is oriented to person, place, and time. He appears well-developed and well-nourished. No distress.  HENT:  Head: Normocephalic and atraumatic.  Right Ear: External ear normal.  Left Ear: External ear normal.  Nose: Nose normal.  Mouth/Throat: Oropharynx is clear and moist. No oropharyngeal exudate.  Eyes: Pupils are equal, round, and reactive to light. Conjunctivae and EOM are normal. Right eye exhibits no discharge. Left eye exhibits no discharge. No scleral icterus.  Neck: Normal range of motion. Neck supple. No JVD present. No tracheal deviation present. No thyromegaly present.  Cardiovascular: Normal rate, regular rhythm, normal heart sounds and intact distal pulses. Exam reveals no gallop and no friction rub.  No murmur heard. Pulmonary/Chest: Effort normal and breath sounds normal. No respiratory distress. He has no wheezes. He has no rales. He exhibits no  tenderness.  Abdominal: Soft. Bowel sounds are normal. He exhibits no distension and no mass. There is no tenderness. There is no rebound and no guarding. No hernia.  Musculoskeletal: Normal range of motion. He exhibits no edema or deformity.  Lymphadenopathy:    He has no cervical adenopathy.  Neurological: He is alert and oriented to person, place, and time. He displays normal reflexes. No cranial nerve deficit or sensory deficit. He exhibits normal muscle tone. Coordination normal.  Skin: Skin is warm and dry. Capillary refill takes less than 2 seconds. No rash noted. He is not diaphoretic. No erythema. No pallor.  Psychiatric: He has a normal mood and affect. His behavior is normal. Judgment and thought content normal.  Nursing note and vitals reviewed.     Assessment & Plan:  1. Essential hypertension - Better controlled with life style modifications.  - Basic metabolic panel - CBC with Differential/Platelet - Hemoglobin A1c - Hepatic function panel - Lipid panel - TSH - Hep C Antibody  2. Type 2 diabetes mellitus with other specified complication, unspecified whether long term insulin use (HCC) - Will decrease Glipizide from 10 mg to 5 mg BID  - Continue with life style modifications  - Follow up in three months for recheck.  - Basic metabolic panel - CBC with Differential/Platelet - Hemoglobin A1c - Hepatic function panel - Lipid panel - TSH - Hep C Antibody  3. Mixed hyperlipidemia - Consider increase in lipitor  - Basic metabolic panel - CBC with Differential/Platelet - Hemoglobin A1c - Hepatic function panel - Lipid panel - TSH - Hep C Antibody  4. BPH associated with nocturia  - PSA  5. Need for hepatitis C screening test  - Hep C Antibody   [name], NP

## 2017-10-10 LAB — HEPATITIS C ANTIBODY
Hepatitis C Ab: NONREACTIVE
SIGNAL TO CUT-OFF: 0.04 (ref <1.00)

## 2017-11-12 ENCOUNTER — Other Ambulatory Visit: Payer: Self-pay | Admitting: Adult Health

## 2017-11-12 MED FILL — metFORMIN HCL 1000 MG TABS: 1000 | 90 days supply | Qty: 180 | Fill #0

## 2017-11-12 NOTE — Telephone Encounter (Signed)
Denied.  Pt is now taking 5 mg tabs.

## 2017-11-13 MED FILL — glipiZIDE 5 MG TABS: 5 | 90 days supply | Qty: 180 | Fill #0

## 2017-11-16 ENCOUNTER — Ambulatory Visit: Payer: Medicare Other

## 2017-11-26 MED FILL — ATORVASTATIN 20 MG TABLET: 20 | 90 days supply | Qty: 90 | Fill #3

## 2017-11-26 MED FILL — CITALOPRAM HBR 20 MG TABLET: 20 | 90 days supply | Qty: 90 | Fill #0

## 2017-11-26 MED FILL — LISINOPRIL 5 MG TABLET: 5 | 90 days supply | Qty: 90 | Fill #0

## 2018-01-09 ENCOUNTER — Encounter: Payer: Self-pay | Admitting: Family Medicine

## 2018-01-09 ENCOUNTER — Ambulatory Visit (INDEPENDENT_AMBULATORY_CARE_PROVIDER_SITE_OTHER): Payer: Medicare Other | Admitting: Family Medicine

## 2018-01-09 VITALS — BP 136/62 | HR 69 | Temp 98.5°F | Wt 235.7 lb

## 2018-01-09 DIAGNOSIS — L03115 Cellulitis of right lower limb: Secondary | ICD-10-CM | POA: Diagnosis not present

## 2018-01-09 DIAGNOSIS — M7751 Other enthesopathy of right foot: Secondary | ICD-10-CM

## 2018-01-09 DIAGNOSIS — M79671 Pain in right foot: Secondary | ICD-10-CM

## 2018-01-09 MED ORDER — DOXYCYCLINE HYCLATE 100 MG PO TABS: 100.0000 mg | ORAL_TABLET | Freq: Two times a day (BID) | ORAL | 0 refills | Status: AC

## 2018-01-09 MED FILL — DOXYCYCLINE HYCLATE 100 MG: 100 | 7 days supply | Qty: 14 | Fill #0

## 2018-01-09 NOTE — Patient Instructions (Addendum)
Start doxycycline as directed. Monitor swelling, redness. Keep foot elevated when sitting above heart level.   Call podiatry for appointment; referral placed.   If you can't get in with specialist in next 1-2 weeks; complete xray.

## 2018-01-09 NOTE — Progress Notes (Signed)
  Jonathan Calhoun DOB: 02/07/1951 Encounter date: 01/09/2018  This is a 67 y.o. male who presents with Chief Complaint  Patient presents with  . Ankle Injury    right ankle, started getting sore 1 month ago, just under the ankle bone, red and swollen, does not hurt to walk on it, painfull to the touch but not painful to put pressure on it    History of present illness:  Had similar issue with left ankle and got it shaved down; tendon re-insertion at that time.   Worries about infection with diabetes. Sore that is present wasn't there on other foot when he had this issue before. Doesn't remember injury to foot.   Saw [facility] did surgery before. Podiatry.   Getting more sore. Noted some pain a month ago. Redness and "sore" have been in last couple of days.   Has been very conscious about diet. Working to keep A1C down. Not checking regularly but is strict with diet.     No Known Allergies Current Meds  Medication Sig  . atorvastatin (LIPITOR) 20 MG tablet TAKE 1 TABLET BY MOUTH DAILY.  . citalopram (CELEXA) 20 MG tablet Take 1 tablet (20 mg total) by mouth daily.  [name] Kitchen glucose blood test strip Use as instructed  . lisinopril (PRINIVIL,ZESTRIL) 5 MG tablet Take 1 tablet (5 mg total) by mouth every morning.  Glory Rosebush DELICA LANCETS MISC by Does not apply route.    . [DISCONTINUED] bisacodyl (DULCOLAX) 5 MG EC tablet Take 10 mg by mouth once.    Review of Systems  Constitutional: Negative for chills and fever.  Musculoskeletal:       See hpi; foot pain  Skin: Positive for wound.    Objective:  BP 136/62 (BP Location: Left Arm, Patient Position: Sitting, Cuff Size: Normal)   Pulse 69   Temp 98.5 F (36.9 C) (Oral)   Wt 235 lb 11.2 oz (106.9 kg)   SpO2 97%   BMI 30.26 kg/m   Weight: 235 lb 11.2 oz (106.9 kg)   BP Readings from Last 3 Encounters:  01/09/18 136/62  10/09/17 114/76  09/04/17 (!) 160/90   Wt Readings from Last 3 Encounters:  01/09/18 235 lb 11.2 oz  (106.9 kg)  10/09/17 238 lb (108 kg)  11/15/16 261 lb (118.4 kg)    Physical Exam  Constitutional: He appears well-developed and well-nourished. No distress.  Cardiovascular:  Pulses:      Posterior tibial pulses are 2+ on the right side.  Pulmonary/Chest: Effort normal.  Musculoskeletal:  There is a prominence of os peroneum right foot. There is tenderness over this area. Subtle surrounding erythema inferior to bony prominence. There is not significant warmth to palpation. There is a mm central scab over bony prominence. No active drainage. No fluctuance appreciated.  Feet:  Right Foot:  Skin Integrity: Positive for callus.    Assessment/Plan 1. Foot pain, right Will call to get in w podiatry; xray if unable to get in within a week. Monitor closely for worsening due to hx of diabetes/risk infection. - DG Foot Complete Right; Future - Ambulatory referral to Podiatry  2. Os peroneum syndrome of right foot Ref podiatry for evaluation.   - DG Foot Complete Right; Future - Ambulatory referral to Podiatry  3. Cellulitis: doxy as directed; monitor redness of ankle and for any worsening - warmth, swelling, drainage, pain. Keep elevated.    Return if symptoms worsen or fail to improve.     Micheline Rough, MD

## 2018-01-15 ENCOUNTER — Ambulatory Visit (INDEPENDENT_AMBULATORY_CARE_PROVIDER_SITE_OTHER): Payer: Medicare Other

## 2018-01-15 DIAGNOSIS — M79671 Pain in right foot: Secondary | ICD-10-CM

## 2018-01-15 DIAGNOSIS — M19071 Primary osteoarthritis, right ankle and foot: Secondary | ICD-10-CM | POA: Diagnosis not present

## 2018-02-05 DIAGNOSIS — M898X7 Other specified disorders of bone, ankle and foot: Secondary | ICD-10-CM | POA: Diagnosis not present

## 2018-02-05 DIAGNOSIS — S86319A Strain of muscle(s) and tendon(s) of peroneal muscle group at lower leg level, unspecified leg, initial encounter: Secondary | ICD-10-CM | POA: Diagnosis not present

## 2018-02-05 DIAGNOSIS — M79671 Pain in right foot: Secondary | ICD-10-CM | POA: Diagnosis not present

## 2018-02-06 ENCOUNTER — Ambulatory Visit: Payer: Medicare Other | Admitting: Adult Health

## 2018-02-08 ENCOUNTER — Other Ambulatory Visit (HOSPITAL_COMMUNITY): Payer: Self-pay | Admitting: Podiatry

## 2018-02-08 DIAGNOSIS — S86311A Strain of muscle(s) and tendon(s) of peroneal muscle group at lower leg level, right leg, initial encounter: Secondary | ICD-10-CM

## 2018-02-13 ENCOUNTER — Ambulatory Visit (HOSPITAL_COMMUNITY)
Admission: RE | Admit: 2018-02-13 | Discharge: 2018-02-13 | Disposition: A | Discharge status: Home / Self Care | Payer: Medicare Other | Source: Ambulatory Visit | Attending: Podiatry | Admitting: Podiatry

## 2018-02-13 DIAGNOSIS — M899 Disorder of bone, unspecified: Secondary | ICD-10-CM | POA: Diagnosis not present

## 2018-02-13 DIAGNOSIS — R9389 Abnormal findings on diagnostic imaging of other specified body structures: Secondary | ICD-10-CM | POA: Diagnosis not present

## 2018-02-13 DIAGNOSIS — M85671 Other cyst of bone, right ankle and foot: Secondary | ICD-10-CM | POA: Diagnosis not present

## 2018-02-13 DIAGNOSIS — S86311A Strain of muscle(s) and tendon(s) of peroneal muscle group at lower leg level, right leg, initial encounter: Secondary | ICD-10-CM | POA: Insufficient documentation

## 2018-02-13 DIAGNOSIS — X58XXXA Exposure to other specified factors, initial encounter: Secondary | ICD-10-CM | POA: Diagnosis not present

## 2018-02-13 DIAGNOSIS — M19071 Primary osteoarthritis, right ankle and foot: Secondary | ICD-10-CM | POA: Diagnosis not present

## 2018-02-21 ENCOUNTER — Other Ambulatory Visit: Payer: Self-pay | Admitting: Adult Health

## 2018-02-21 DIAGNOSIS — E1169 Type 2 diabetes mellitus with other specified complication: Secondary | ICD-10-CM

## 2018-02-21 DIAGNOSIS — Z760 Encounter for issue of repeat prescription: Secondary | ICD-10-CM

## 2018-02-21 MED FILL — CITALOPRAM HBR 20 MG TABLET: 20 | 90 days supply | Qty: 90 | Fill #1

## 2018-02-21 MED FILL — LISINOPRIL 5 MG TABLET: 5 | 90 days supply | Qty: 90 | Fill #1

## 2018-02-22 DIAGNOSIS — M898X7 Other specified disorders of bone, ankle and foot: Secondary | ICD-10-CM | POA: Diagnosis not present

## 2018-02-22 DIAGNOSIS — M79671 Pain in right foot: Secondary | ICD-10-CM | POA: Diagnosis not present

## 2018-02-22 MED FILL — metFORMIN HCL 1000 MG TABS: 1000 | 90 days supply | Qty: 180 | Fill #0

## 2018-02-22 MED FILL — glipiZIDE 5 MG TABS: 5 | 90 days supply | Qty: 180 | Fill #0

## 2018-02-22 MED FILL — ATORVASTATIN CALCIUM 20 MG: 20 | 90 days supply | Qty: 90 | Fill #0

## 2018-03-06 ENCOUNTER — Ambulatory Visit: Payer: Medicare Other | Admitting: Adult Health

## 2018-03-06 DIAGNOSIS — Z0289 Encounter for other administrative examinations: Secondary | ICD-10-CM

## 2018-03-13 ENCOUNTER — Other Ambulatory Visit: Payer: Self-pay | Admitting: Podiatry

## 2018-03-27 NOTE — Patient Instructions (Signed)
Jonathan Calhoun  03/27/2018     @PREFPERIOPPHARMACY @   Your procedure is scheduled on  04/03/2018 .  Report to [facility] at  615   A.M.  Call this number if you have problems the morning of surgery:  [phone]   Remember:  Do not eat or drink after midnight.                     Take these medicines the morning of surgery with A SIP OF WATER  Celexa, lisinopril.    Do not wear jewelry, make-up or nail polish.  Do not wear lotions, powders, or perfumes, or deodorant.  Do not shave 48 hours prior to surgery.  Men may shave face and neck.  Do not bring valuables to the hospital.  [facility] is not responsible for any belongings or valuables.  Contacts, dentures or bridgework may not be worn into surgery.  Leave your suitcase in the car.  After surgery it may be brought to your room.  For patients admitted to the hospital, discharge time will be determined by your treatment team.  Patients discharged the day of surgery will not be allowed to drive home.   Name and phone number of your driver:   family Special instructions:  DO NOT take any medications for diabetes the morning of your surgery.  Please read over the following fact sheets that you were given. Anesthesia Post-op Instructions and Care and Recovery After Surgery      Peroneal Tendon Subluxation and Dislocation The peroneal tendons are strong bands of tissue that connect a bone in your foot to the muscles that let you turn your feet outward and stand on your toes (peroneal muscles). The tendons pass under the ankle and into a groove of bone. Peroneal tendon subluxation and dislocation are injuries that happen when the peroneal tendons move out of this groove. The injuries can make your ankle weak and can make your ankle and foot hurt. What are the causes? This condition may be caused by:  A severe ankle sprain.  Wear and tear from repeated ankle stress (overuse).  What increases the  risk? This condition is more likely to develop in people who play sports in which there is a risk of spraining an ankle or having the foot be forcefully pushed up and in. These sports include:  Skiing.  Hockey.  Ice Counselling psychologist.  Soccer.  Basketball.  Gymnastics.  What are the signs or symptoms? Symptoms of this condition may start suddenly or develop gradually. Symptoms include:  Pain in the back of the ankle.  A snapping or popping feeling or sound.  Swelling in the ankle.  Bruising in the ankle.  A weak and wobbly ankle or a feeling that your ankle is giving way (ankle instability).  Tenderness in the ankle.  How is this diagnosed? This condition may be diagnosed based on:  Your symptoms.  Your medical history.  A physical exam.  Tests, such as: ? An X-ray or CT scan. These may be done to check your ankle bone. ? MRI or ultrasound. These may be done to check your tendon.  How is this treated? Treatment for a subluxation injury may include:  Keeping your body weight off of your ankle for up to 6 weeks. This treatment involves using a device to help you walk, such as crutches or a walker.  Wearing a boot or cast  to support your ankle and keep it still.  Applying ice to the ankle to reduce swelling.  Taking anti-inflammatory pain medicine.  Having medicines injected into your tendon to reduce swelling.  Doing exercises (physical therapy).  Returning gradually to full activity.  Surgery. This may be needed if the injury comes back or does not get better after 3-6 months.  Treatment for a dislocation injury usually involves surgery:  To repair any tears in the tissue that holds your tendons in place (retinaculum).  To create a groove behind the ankle.  To secure the tendons in place with sutures, screws, or pins.  Follow these instructions at home: If you have a boot:  Wear it as told by your health care provider. Remove it only as told  by your health care provider.  Loosen the boot if your toes tingle, become numb, or turn cold and blue.  If the boot is not waterproof: ? Do not let it get wet. ? Cover it with a watertight covering when you take a bath or a shower.  Keep the boot clean. If you have a cast:  Do not put pressure on any part of the cast or splint until it is fully hardened. This may take several hours.  Do not stick anything inside the cast to scratch your skin. Doing that increases your risk of infection.  Check the skin around the cast every day. Tell your health care provider about any concerns.  You may put lotion on dry skin around the edges of the cast. Do not put lotion on the skin underneath the cast.  If the cast is not waterproof: ? Do not let it get wet. ? Cover it with a watertight covering when you take a bath or a shower.  Keep the cast clean. Managing pain, stiffness, and swelling  Take over-the-counter and prescription medicines only as told by your health care provider.  If directed, apply ice to the injured area: ? Put ice in a plastic bag. ? Place a towel between your skin and the bag. ? Leave the ice on for 20 minutes, 2-3 times a day.  Move your toes often to avoid stiffness and to lessen swelling. Activity  Return to your normal activities as told by your health care provider. Ask your health care provider what activities are safe for you.  Do not use your leg to support (bear) all of your body weight until your health care provider says that you can. Use crutches or a walker as told by your health care provider.  Do not do any activities that make pain or swelling worse.  Do exercises as told by your health care provider. Driving  Do not drive or operate heavy machinery while taking prescription pain medicine.  Ask your health care provider when it is safe to drive if you have a cast or boot on your leg. General instructions  Do not use any tobacco products, such  as cigarettes, chewing tobacco, and e-cigarettes. Tobacco can delay bone healing. If you need help quitting, ask your health care provider.  Keep all follow-up visits as told by your health care provider. This is important. How is this prevented?  Wear supportive footwear that is appropriate for your athletic activity.  Avoid athletic activities that cause pain or swelling in your ankle or foot.  See your health care provider if you have pain or swelling that does not improve after a few days of rest.  Stop training if you develop  pain or swelling.  If you start a new athletic activity, start gradually so you can build up your strength and flexibility. Contact a health care provider if:  Your symptoms get worse.  Your symptoms do not improve in 2-4 weeks.  You develop new, unexplained symptoms.  Your splint, boot, or cast becomes loose or damaged. This information is not intended to replace advice given to you by your health care provider. Make sure you discuss any questions you have with your health care provider. Document Released: 05/08/2005 Document Revised: 01/11/2016 Document Reviewed: 03/27/2015 Elsevier Interactive Patient Education  2018 [place].    Peroneal Tendon Rupture Peroneal tendon rupture is a complete tear in one or both of the two peroneal tendons that are on the outside of the ankle. These tendons attach two muscles (peroneus longus and peroneus brevis) to the bones of the ankle. These two muscles are used to straighten the foot, such as when standing on tiptoes or pushing off of the foot during walking, running, or jumping. These muscles are also used to turn the foot to the outside. A rupture of one or both of these tendons decreases the ankle's ability to do these motions. What are the causes? This condition is often due to long-term (chronic) injury, but it may also occur suddenly. These types of injuries can happen if:  Repeated ankle movements cause  the peroneal tendons to rub forcefully against the bony groove in which they sit.  A sudden stress on the peroneal tendons stretches them farther or faster than they are meant to stretch.  A person has certain medical problems.  A person has taken certain medicines for a long time.  What increases the risk? This condition is more likely to develop in:  People who play sports that require sudden, explosive lower leg muscle contraction, as with jumping or quick starts.  People who play sports that involve kicking with the outer part of the foot, such as martial arts.  People who have a misshapen groove in the bottom of the heelbone (calcaneus) where the peroneal tendons sit.  People who have poor foot strength and flexibility.  People who do not warm up properly before activities.  People who have had a previous or recent ankle sprain.  People who have had a previous or untreated peroneal tendon injury.  People who have a corticosteroid injection in or near the peroneal tendon, especially where it inserts into the bone.  People who have medical problems that decrease blood supply to the peroneal tendons.  People who use certain antibiotics.  What are the signs or symptoms? Symptoms of this condition include:  A "pop" or tearing feeling behind the outer part of the ankle.  Pain when moving the foot up and down.  A clicking feeling when moving the foot up and down.  Inability to stand on the toes or the ball of the foot or weakness when trying to stand on the toes or the ball of the foot.  Pain when the injured area is pressed.  Swelling around the injured area.  Bruising around the injured area.  How is this diagnosed? This condition is diagnosed with a medical history and physical exam. Your health care provider will ask about your symptoms and ask for details about your injury, if your condition was caused by an injury. Imaging studies are often used to confirm the  diagnosis. These may include:  X-ray.  MRI.  Ultrasound.  How is this treated? Treatment may start  with the use of icing and medicines to reduce your pain. You may also be advised to limit the movement of your ankle. This may include wearing a splint or a brace, or using crutches to avoid using your injured ankle to support your body weight. In severe cases, surgery may be needed to repair the rupture. Follow these instructions at home: If you have a splint or brace:  Wear it as told by your health care provider. Remove it only as told by your health care provider.  Loosen the splint or brace if your toes become numb and tingle, or if they turn cold and blue.  Keep the splint or brace clean and dry. Managing pain, stiffness, and swelling  If directed, apply ice to the injured area. ? Put ice in a plastic bag. ? Place a towel between your skin and the bag. ? Leave the ice on for 20 minutes, 2-3 times per day.  Move your toes often to avoid stiffness and to lessen swelling.  Raise (elevate) the injured area above the level of your heart while you are sitting or lying down. Driving  Do not drive or operate heavy machinery while taking prescription pain medicine.  Ask your health care provider when it is safe to drive if you have a cast, splint, or brace on your ankle. General instructions  Return to your normal activities as told by your health care provider. Ask your health care provider what activities are safe for you.  Take over-the-counter and prescription medicines only as told by your health care provider.  Keep all follow-up visits as told by your health care provider. This is important.  Do not use the injured limb to support your body weight until your health care provider says that you can. Use crutches as told by your health care provider. Contact a health care provider if:  Your pain gets worse even if you have had treatment.  Your foot or ankle feels numb or  cold, or looks blue. This information is not intended to replace advice given to you by your health care provider. Make sure you discuss any questions you have with your health care provider. Document Released: 05/08/2005 Document Revised: 10/14/2015 Document Reviewed: 07/14/2014 Elsevier Interactive Patient Education  2018 [place] of the foot is a surgery to remove part of a bone in the foot. You may need this procedure if:  You have an abnormal bone growth called a bone spur (osteophyte) in your foot.  Bones in your foot are not lined up with each other (misalignment).  The procedure may be done if one of these conditions is causing pain or limiting movement. It is usually done when other treatments have not helped. During an ostectomy, part of the bone is removed to shorten or reshape the bone. Tell a health care provider about:  Any allergies you have.  All medicines you are taking, including vitamins, herbs, eye drops, creams, and over-the-counter medicines.  Any problems you or family members have had with anesthetic medicines.  Any blood disorders you have.  Any surgeries you have had.  Any medical conditions you have.  Whether you are pregnant or may be pregnant. What are the risks? Generally, this is a safe procedure. However, problems may occur, including:  Infection.  Bleeding.  Pain.  Stiffness.  Numbness.  Allergic reactions to medicines.  Damage to nerves or blood vessels in the foot.  A blood clot that forms in the  leg and travels to the lung.  Failure of the bone to heal.  The abnormal bone growth coming back again (recurrence).  What happens before the procedure? Staying hydrated Follow instructions from your health care provider about hydration, which may include:  Up to 2 hours before the procedure - you may continue to drink clear liquids, such as water, clear fruit juice, black coffee, and plain  tea.  Eating and drinking restrictions Follow instructions from your health care provider about eating and drinking, which may include:  8 hours before the procedure - stop eating heavy meals or foods such as meat, fried foods, or fatty foods.  6 hours before the procedure - stop eating light meals or foods, such as toast or cereal.  6 hours before the procedure - stop drinking milk or drinks that contain milk.  2 hours before the procedure - stop drinking clear liquids.  Medicines  Ask your health care provider about: ? Changing or stopping your regular medicines. This is especially important if you are taking diabetes medicines or blood thinners. ? Taking medicines such as aspirin and ibuprofen. These medicines can thin your blood. Do not take these medicines before your procedure if your health care provider instructs you not to.  You may be given antibiotic medicine to help prevent infection. General instructions  You may have foot X-rays done while you are standing on your foot. This allows the surgeon to clearly see the abnormal bone growth in your foot.  Ask your health care provider how your surgical site will be marked or identified.  You may be asked to shower with a germ-killing soap.  Plan to have a responsible adult care for you for at least 24 hours after you leave the hospital or clinic. This is important. What happens during the procedure?  To lower your risk of infection: ? Your health care team will wash or sanitize their hands. ? Your skin will be washed with soap. ? Hair may be removed from the surgical area.  You will be given one or more of the following: ? A medicine to help you relax (sedative). ? A medicine to numb the area (local anesthetic). ? A medicine to make you fall asleep (general anesthetic). ? A medicine that is injected into your spine to numb the area below and slightly above the injection site (spinal anesthetic). ? A medicine that is  injected into an area of your body to numb everything below the injection site (regional anesthetic).  An incision will be made in your foot.  Tissues, nerves, and blood vessels near the bone will be moved out of the way.  The spur or the part of bone that needs to be removed will be cut away using a bone saw or a bone shaving instrument.  The remaining bone may be secured with screws, wires, or plates.  The skin incision will be closed with stitches (sutures) or another type of skin closure.  A bandage (dressing) will be placed over the incision.  A soft wrap may be placed around your foot. The procedure may vary among health care providers and hospitals. What happens after the procedure?  Your blood pressure, heart rate, breathing rate, and blood oxygen level will be monitored until the medicines you were given have worn off.  Your foot may be placed in a protective shoe, a splint, or a cast.  You may be given crutches or a walker to help you walk without putting weight (bearing weight) on  your foot.  Do not drive for 24 hours if you were given a sedative. Summary  Ostectomy of the foot is a surgery to remove part of a bone in the foot.  You may need this procedure if you have an abnormal bone growth called a bone spur (osteophyte) or if bones in your foot are not lined up with each other (misalignment). The procedure may be done if you have pain or limited movement and other treatments have not helped.  Follow instructions from your health care provider about eating and drinking before the procedure.  After the procedure, your foot may be placed in a protective shoe, a splint, or a cast. You may be given crutches or a walker to help you walk without putting weight (bearing weight) on your foot. This information is not intended to replace advice given to you by your health care provider. Make sure you discuss any questions you have with your health care provider. Document  Released: 06/27/2016 Document Revised: 06/27/2016 Document Reviewed: 06/27/2016 Elsevier Interactive Patient Education  2018 [place] After This sheet gives you information about how to care for yourself after your procedure. Your health care provider may also give you more specific instructions. If you have problems or questions, contact your health care provider. What can I expect after the procedure? After the procedure, it is common to have:  Pain.  Swelling.  Stiffness.  Follow these instructions at home: If you have a splint or supportive shoe:  Wear the splint or shoe as told by your health care provider. Remove it only as told by your health care provider.  Loosen the splint or shoe if your toes tingle, become numb, or turn cold and blue.  Keep the splint or shoe clean.  If the splint or shoe is not waterproof: ? Do not let it get wet. ? Cover it with a watertight covering when you take a bath or a shower. If you have a cast:  Do not stick anything inside the cast to scratch your skin. Doing that increases your risk of infection.  Check the skin around the cast every day. Tell your health care provider about any concerns.  You may put lotion on dry skin around the edges of the cast. Do not put lotion on the skin underneath the cast.  Keep the cast clean.  If the cast is not waterproof: ? Do not let it get wet. ? Cover it with a watertight covering when you take a bath or a shower. Bathing  Do not take baths, swim, or use a hot tub until your health care provider approves. Ask your health care provider if you may take showers. You may only be allowed to take sponge baths for bathing.  If your cast or splint is not waterproof, cover it with a watertight covering when you take a bath or a shower.  Keep the bandage (dressing) dry until your health care provider says it can be removed. Incision care  Follow instructions from your  health care provider about how to take care of your incision. Make sure you: ? Wash your hands with soap and water before you change your bandage (dressing). If soap and water are not available, use hand sanitizer. ? Change your dressing as told by your health care provider. ? Leave stitches (sutures), skin glue, or adhesive strips in place. These skin closures may need to stay in place for 2 weeks or longer. If adhesive  strip edges start to loosen and curl up, you may trim the loose edges. Do not remove adhesive strips completely unless your health care provider tells you to do that.  Check your incision area every day for signs of infection. Check for: ? Redness, swelling, or pain. ? Fluid or blood. ? Warmth. ? Pus or a bad smell. Managing pain, stiffness, and swelling  If directed, put ice on the affected area. ? If you have a removable splint or shoe, remove it as told by your health care provider. ? Put ice in a plastic bag. ? Place a towel between your skin and the bag or between your cast and the bag. ? Leave the ice on for 20 minutes, 2-3 times a day.  Move your foot and toes often to avoid stiffness and to lessen swelling.  Raise (elevate) your foot area above the level of your heart while you are sitting or lying down. Driving  Do not drive or use heavy machinery while taking prescription pain medicine.  Ask your health care provider when it is safe to drive if you have a cast, splint, or supportive shoe on your foot. Activity  Return to your normal activities as told by your health care provider. Ask your health care provider what activities are safe for you.  Do exercises as told by your health care provider. Safety  Do not use your foot to support your body weight until your health care provider says that you can.  Use your crutches or walker as told by your health care provider. General instructions  Do not put pressure on any part of the cast or splint until it is  fully hardened. This may take several hours.  Do not use any products that contain nicotine or tobacco, such as cigarettes and e-cigarettes. These can delay bone healing. If you need help quitting, ask your health care provider.  Take over-the-counter and prescription medicines only as told by your health care provider.  Keep all follow-up visits as told by your health care provider. This is important. Contact a health care provider if:  You have chills or a fever.  Your pain is not controlled by your pain medicine.  You have redness, swelling, or pain around your incision.  You have fluid or blood coming from your incision.  Your incision feels warm to the touch.  You have pus or a bad smell coming from your incision. Get help right away if:  You have severe pain.  You have new pain, warmth, and swelling in your leg.  You have chest pain or difficulty breathing. Summary  After the procedure, it is common to have some pain, swelling, and stiffness.  Follow instructions from your health care provider about how to take care of your incision. Check the incision area every day for signs of infection.  Do not use your foot to support your body weight until your health care provider says that you can.  Move your foot and toes often to avoid stiffness and to lessen swelling. This information is not intended to replace advice given to you by your health care provider. Make sure you discuss any questions you have with your health care provider. Document Released: 06/27/2016 Document Revised: 06/27/2016 Document Reviewed: 06/27/2016 Elsevier Interactive Patient Education  2018 [place] Anesthesia is a term that refers to techniques, procedures, and medicines that help a person stay safe and comfortable during a medical procedure. Monitored anesthesia care, or sedation, is  one type of anesthesia. Your anesthesia specialist may recommend sedation if you  will be having a procedure that does not require you to be unconscious, such as:  Cataract surgery.  A dental procedure.  A biopsy.  A colonoscopy.  During the procedure, you may receive a medicine to help you relax (sedative). There are three levels of sedation:  Mild sedation. At this level, you may feel awake and relaxed. You will be able to follow directions.  Moderate sedation. At this level, you will be sleepy. You may not remember the procedure.  Deep sedation. At this level, you will be asleep. You will not remember the procedure.  The more medicine you are given, the deeper your level of sedation will be. Depending on how you respond to the procedure, the anesthesia specialist may change your level of sedation or the type of anesthesia to fit your needs. An anesthesia specialist will monitor you closely during the procedure. Let your health care provider know about:  Any allergies you have.  All medicines you are taking, including vitamins, herbs, eye drops, creams, and over-the-counter medicines.  Any use of steroids (by mouth or as a cream).  Any problems you or family members have had with sedatives and anesthetic medicines.  Any blood disorders you have.  Any surgeries you have had.  Any medical conditions you have, such as sleep apnea.  Whether you are pregnant or may be pregnant.  Any use of cigarettes, alcohol, or street drugs. What are the risks? Generally, this is a safe procedure. However, problems may occur, including:  Getting too much medicine (oversedation).  Nausea.  Allergic reaction to medicines.  Trouble breathing. If this happens, a breathing tube may be used to help with breathing. It will be removed when you are awake and breathing on your own.  Heart trouble.  Lung trouble.  Before the procedure Staying hydrated Follow instructions from your health care provider about hydration, which may include:  Up to 2 hours before the  procedure - you may continue to drink clear liquids, such as water, clear fruit juice, black coffee, and plain tea.  Eating and drinking restrictions Follow instructions from your health care provider about eating and drinking, which may include:  8 hours before the procedure - stop eating heavy meals or foods such as meat, fried foods, or fatty foods.  6 hours before the procedure - stop eating light meals or foods, such as toast or cereal.  6 hours before the procedure - stop drinking milk or drinks that contain milk.  2 hours before the procedure - stop drinking clear liquids.  Medicines Ask your health care provider about:  Changing or stopping your regular medicines. This is especially important if you are taking diabetes medicines or blood thinners.  Taking medicines such as aspirin and ibuprofen. These medicines can thin your blood. Do not take these medicines before your procedure if your health care provider instructs you not to.  Tests and exams  You will have a physical exam.  You may have blood tests done to show: ? How well your kidneys and liver are working. ? How well your blood can clot.  General instructions  Plan to have someone take you home from the hospital or clinic.  If you will be going home right after the procedure, plan to have someone with you for 24 hours.  What happens during the procedure?  Your blood pressure, heart rate, breathing, level of pain and overall condition will be  monitored.  An IV tube will be inserted into one of your veins.  Your anesthesia specialist will give you medicines as needed to keep you comfortable during the procedure. This may mean changing the level of sedation.  The procedure will be performed. After the procedure  Your blood pressure, heart rate, breathing rate, and blood oxygen level will be monitored until the medicines you were given have worn off.  Do not drive for 24 hours if you received a  sedative.  You may: ? Feel sleepy, clumsy, or nauseous. ? Feel forgetful about what happened after the procedure. ? Have a sore throat if you had a breathing tube during the procedure. ? Vomit. This information is not intended to replace advice given to you by your health care provider. Make sure you discuss any questions you have with your health care provider. Document Released: 02/01/2005 Document Revised: 10/15/2015 Document Reviewed: 08/29/2015 Elsevier Interactive Patient Education  2018 [place], Care After These instructions provide you with information about caring for yourself after your procedure. Your health care provider may also give you more specific instructions. Your treatment has been planned according to current medical practices, but problems sometimes occur. Call your health care provider if you have any problems or questions after your procedure. What can I expect after the procedure? After your procedure, it is common to:  Feel sleepy for several hours.  Feel clumsy and have poor balance for several hours.  Feel forgetful about what happened after the procedure.  Have poor judgment for several hours.  Feel nauseous or vomit.  Have a sore throat if you had a breathing tube during the procedure.  Follow these instructions at home: For at least 24 hours after the procedure:   Do not: ? Participate in activities in which you could fall or become injured. ? Drive. ? Use heavy machinery. ? Drink alcohol. ? Take sleeping pills or medicines that cause drowsiness. ? Make important decisions or sign legal documents. ? Take care of children on your own.  Rest. Eating and drinking  Follow the diet that is recommended by your health care provider.  If you vomit, drink water, juice, or soup when you can drink without vomiting.  Make sure you have little or no nausea before eating solid foods. General instructions  Have a  responsible adult stay with you until you are awake and alert.  Take over-the-counter and prescription medicines only as told by your health care provider.  If you smoke, do not smoke without supervision.  Keep all follow-up visits as told by your health care provider. This is important. Contact a health care provider if:  You keep feeling nauseous or you keep vomiting.  You feel light-headed.  You develop a rash.  You have a fever. Get help right away if:  You have trouble breathing. This information is not intended to replace advice given to you by your health care provider. Make sure you discuss any questions you have with your health care provider. Document Released: 08/29/2015 Document Revised: 12/29/2015 Document Reviewed: 08/29/2015 Elsevier Interactive Patient Education  [employer].

## 2018-03-29 ENCOUNTER — Other Ambulatory Visit (HOSPITAL_COMMUNITY): Payer: Medicare Other

## 2018-04-02 ENCOUNTER — Other Ambulatory Visit: Payer: Self-pay

## 2018-04-02 ENCOUNTER — Ambulatory Visit (HOSPITAL_COMMUNITY)
Admission: RE | Admit: 2018-04-02 | Discharge: 2018-04-02 | Disposition: A | Discharge status: Home / Self Care | Payer: Medicare Other | Source: Ambulatory Visit | Attending: Podiatry | Admitting: Podiatry

## 2018-04-02 ENCOUNTER — Encounter (HOSPITAL_COMMUNITY)
Admission: RE | Admit: 2018-04-02 | Discharge: 2018-04-02 | Disposition: A | Discharge status: Home / Self Care | Payer: Medicare Other | Source: Ambulatory Visit | Attending: Podiatry | Admitting: Podiatry

## 2018-04-02 ENCOUNTER — Encounter (HOSPITAL_COMMUNITY): Payer: Self-pay

## 2018-04-02 ENCOUNTER — Other Ambulatory Visit (HOSPITAL_COMMUNITY): Payer: Self-pay | Admitting: Podiatry

## 2018-04-02 DIAGNOSIS — M2041 Other hammer toe(s) (acquired), right foot: Secondary | ICD-10-CM | POA: Diagnosis not present

## 2018-04-02 DIAGNOSIS — M79671 Pain in right foot: Secondary | ICD-10-CM | POA: Diagnosis not present

## 2018-04-02 DIAGNOSIS — M7731 Calcaneal spur, right foot: Secondary | ICD-10-CM | POA: Insufficient documentation

## 2018-04-02 DIAGNOSIS — M898X7 Other specified disorders of bone, ankle and foot: Secondary | ICD-10-CM | POA: Diagnosis not present

## 2018-04-02 DIAGNOSIS — M19071 Primary osteoarthritis, right ankle and foot: Secondary | ICD-10-CM | POA: Diagnosis not present

## 2018-04-02 DIAGNOSIS — Z01818 Encounter for other preprocedural examination: Secondary | ICD-10-CM | POA: Insufficient documentation

## 2018-04-02 HISTORY — DX: Anxiety disorder, unspecified: F41.9

## 2018-04-02 HISTORY — DX: Depression, unspecified: F32.A

## 2018-04-02 HISTORY — DX: Major depressive disorder, single episode, unspecified: F32.9

## 2018-04-02 LAB — BASIC METABOLIC PANEL
Anion gap: 7 (ref 5–15)
BUN: 8 mg/dL (ref 8–23)
CO2: 26 mmol/L (ref 22–32)
Calcium: 9.5 mg/dL (ref 8.9–10.3)
Chloride: 103 mmol/L (ref 98–111)
Creatinine, Ser: 0.63 mg/dL (ref 0.61–1.24)
GFR calc Af Amer: >60 mL/min (ref >60)
GLUCOSE: 83 mg/dL (ref 70–99)
Potassium: 4 mmol/L (ref 3.5–5.1)
Sodium: 136 mmol/L (ref 135–145)

## 2018-04-02 LAB — CBC
HEMATOCRIT: 43.4 % (ref 39.0–52.0)
Hemoglobin: 14.4 g/dL (ref 13.0–17.0)
MCH: 31 pg (ref 26.0–34.0)
MCHC: 33.2 g/dL (ref 30.0–36.0)
MCV: 93.3 fL (ref 80.0–100.0)
Platelets: 186 10*3/uL (ref 150–400)
RBC: 4.65 MIL/uL (ref 4.22–5.81)
RDW: 14.4 % (ref 11.5–15.5)
WBC: 9.6 10*3/uL (ref 4.0–10.5)
nRBC: 0 % (ref 0.0–0.2)

## 2018-04-02 LAB — GLUCOSE, CAPILLARY: Glucose-Capillary: 79 mg/dL (ref 70–99)

## 2018-04-02 LAB — HEMOGLOBIN A1C
Hgb A1c MFr Bld: 6 % — ABNORMAL HIGH (ref 4.8–5.6)
Mean Plasma Glucose: 125.5 mg/dL

## 2018-04-03 ENCOUNTER — Encounter (HOSPITAL_COMMUNITY): Payer: Self-pay

## 2018-04-03 ENCOUNTER — Ambulatory Visit (HOSPITAL_COMMUNITY): Payer: Medicare Other

## 2018-04-03 ENCOUNTER — Ambulatory Visit (HOSPITAL_COMMUNITY)
Admission: RE | Admit: 2018-04-03 | Discharge: 2018-04-03 | Disposition: A | Discharge status: Home / Self Care | Payer: Medicare Other | Source: Ambulatory Visit | Attending: Podiatry | Admitting: Podiatry

## 2018-04-03 ENCOUNTER — Encounter (HOSPITAL_COMMUNITY)
Admission: RE | Disposition: A | Discharge status: Home / Self Care | Payer: Self-pay | Source: Ambulatory Visit | Attending: Podiatry

## 2018-04-03 ENCOUNTER — Ambulatory Visit (HOSPITAL_COMMUNITY): Payer: Medicare Other | Admitting: Anesthesiology

## 2018-04-03 DIAGNOSIS — Z79899 Other long term (current) drug therapy: Secondary | ICD-10-CM | POA: Diagnosis not present

## 2018-04-03 DIAGNOSIS — M7731 Calcaneal spur, right foot: Secondary | ICD-10-CM | POA: Diagnosis not present

## 2018-04-03 DIAGNOSIS — F419 Anxiety disorder, unspecified: Secondary | ICD-10-CM | POA: Diagnosis not present

## 2018-04-03 DIAGNOSIS — E119 Type 2 diabetes mellitus without complications: Secondary | ICD-10-CM | POA: Diagnosis not present

## 2018-04-03 DIAGNOSIS — X58XXXA Exposure to other specified factors, initial encounter: Secondary | ICD-10-CM | POA: Diagnosis not present

## 2018-04-03 DIAGNOSIS — M898X7 Other specified disorders of bone, ankle and foot: Secondary | ICD-10-CM | POA: Diagnosis not present

## 2018-04-03 DIAGNOSIS — Z9889 Other specified postprocedural states: Secondary | ICD-10-CM

## 2018-04-03 DIAGNOSIS — Z7984 Long term (current) use of oral hypoglycemic drugs: Secondary | ICD-10-CM | POA: Diagnosis not present

## 2018-04-03 DIAGNOSIS — F329 Major depressive disorder, single episode, unspecified: Secondary | ICD-10-CM | POA: Insufficient documentation

## 2018-04-03 DIAGNOSIS — S86311A Strain of muscle(s) and tendon(s) of peroneal muscle group at lower leg level, right leg, initial encounter: Secondary | ICD-10-CM | POA: Insufficient documentation

## 2018-04-03 DIAGNOSIS — M79671 Pain in right foot: Secondary | ICD-10-CM | POA: Insufficient documentation

## 2018-04-03 DIAGNOSIS — M25774 Osteophyte, right foot: Secondary | ICD-10-CM | POA: Diagnosis not present

## 2018-04-03 DIAGNOSIS — I10 Essential (primary) hypertension: Secondary | ICD-10-CM | POA: Diagnosis not present

## 2018-04-03 DIAGNOSIS — E669 Obesity, unspecified: Secondary | ICD-10-CM | POA: Diagnosis not present

## 2018-04-03 DIAGNOSIS — S86311D Strain of muscle(s) and tendon(s) of peroneal muscle group at lower leg level, right leg, subsequent encounter: Secondary | ICD-10-CM | POA: Diagnosis not present

## 2018-04-03 DIAGNOSIS — Y929 Unspecified place or not applicable: Secondary | ICD-10-CM | POA: Insufficient documentation

## 2018-04-03 HISTORY — PX: OSTECTOMY: SHX6439

## 2018-04-03 HISTORY — PX: TENDON REPAIR: SHX5111

## 2018-04-03 LAB — GLUCOSE, CAPILLARY
GLUCOSE-CAPILLARY: 110 mg/dL — AB (ref 70–99)
Glucose-Capillary: 105 mg/dL — ABNORMAL HIGH (ref 70–99)

## 2018-04-03 SURGERY — OSTECTOMY
Anesthesia: Monitor Anesthesia Care | Site: Foot | Laterality: Right

## 2018-04-03 MED ORDER — HYDROCODONE-ACETAMINOPHEN 7.5-325 MG PO TABS: 1.0000 | ORAL_TABLET | Freq: Once | ORAL | Status: DC | PRN

## 2018-04-03 MED ORDER — ARTIFICIAL TEARS OPHTHALMIC OINT
TOPICAL_OINTMENT | OPHTHALMIC | Status: AC
  Filled 2018-04-03: qty 3.5

## 2018-04-03 MED ORDER — MIDAZOLAM HCL 2 MG/2ML IJ SOLN
INTRAMUSCULAR | Status: AC
  Filled 2018-04-03: qty 2

## 2018-04-03 MED ORDER — HYDROMORPHONE HCL 1 MG/ML IJ SOLN: 0.2500 mg | INTRAMUSCULAR | Status: DC | PRN

## 2018-04-03 MED ORDER — FENTANYL CITRATE (PF) 100 MCG/2ML IJ SOLN
INTRAMUSCULAR | Status: AC
  Filled 2018-04-03: qty 2

## 2018-04-03 MED ORDER — PROPOFOL 10 MG/ML IV BOLUS
INTRAVENOUS | Status: DC | PRN
  Administered 2018-04-03: 150 ug/kg/min via INTRAVENOUS

## 2018-04-03 MED ORDER — LIDOCAINE HCL (PF) 1 % IJ SOLN
INTRAMUSCULAR | Status: AC
  Filled 2018-04-03: qty 30

## 2018-04-03 MED ORDER — FENTANYL CITRATE (PF) 100 MCG/2ML IJ SOLN
INTRAMUSCULAR | Status: DC | PRN
  Administered 2018-04-03: 25 ug via INTRAVENOUS

## 2018-04-03 MED ORDER — MEPERIDINE HCL 50 MG/ML IJ SOLN: 6.2500 mg | INTRAMUSCULAR | Status: DC | PRN

## 2018-04-03 MED ORDER — LACTATED RINGERS IV SOLN: INTRAVENOUS | Status: DC

## 2018-04-03 MED ORDER — PROPOFOL 10 MG/ML IV BOLUS
INTRAVENOUS | Status: AC
  Filled 2018-04-03: qty 40

## 2018-04-03 MED ORDER — LACTATED RINGERS IV SOLN
INTRAVENOUS | Status: DC
  Administered 2018-04-03: 07:00:00 via INTRAVENOUS

## 2018-04-03 MED ORDER — BUPIVACAINE HCL (PF) 0.5 % IJ SOLN
INTRAMUSCULAR | Status: AC
  Filled 2018-04-03: qty 30

## 2018-04-03 MED ORDER — 0.9 % SODIUM CHLORIDE (POUR BTL) OPTIME
TOPICAL | Status: DC | PRN
  Administered 2018-04-03: 1000 mL

## 2018-04-03 MED ORDER — PROPOFOL 10 MG/ML IV BOLUS
INTRAVENOUS | Status: DC | PRN
  Administered 2018-04-03: 40 mg via INTRAVENOUS

## 2018-04-03 MED ORDER — CEFAZOLIN SODIUM-DEXTROSE 2-4 GM/100ML-% IV SOLN
2.0000 g | Freq: Once | INTRAVENOUS | Status: AC
  Administered 2018-04-03: 2 g via INTRAVENOUS
  Filled 2018-04-03: qty 100

## 2018-04-03 MED ORDER — BUPIVACAINE HCL (PF) 0.5 % IJ SOLN
INTRAMUSCULAR | Status: DC | PRN
  Administered 2018-04-03: 20 mL

## 2018-04-03 MED ORDER — PROMETHAZINE HCL 25 MG/ML IJ SOLN: 6.2500 mg | INTRAMUSCULAR | Status: DC | PRN

## 2018-04-03 MED ORDER — PROPOFOL 10 MG/ML IV BOLUS: INTRAVENOUS | Status: DC | PRN

## 2018-04-03 SURGICAL SUPPLY — 49 items
APL SKNCLS STERI-STRIP NONHPOA (GAUZE/BANDAGES/DRESSINGS) ×1
BANDAGE ELASTIC 4 LF NS (GAUZE/BANDAGES/DRESSINGS) ×3 IMPLANT
BANDAGE ESMARK 4X12 BL STRL LF (DISPOSABLE) ×1 IMPLANT
BENZOIN TINCTURE PRP APPL 2/3 (GAUZE/BANDAGES/DRESSINGS) ×3 IMPLANT
BLADE OSC/SAGITTAL MD 5.5X18 (BLADE) ×3 IMPLANT
BLADE SURG 15 STRL LF DISP TIS (BLADE) ×2 IMPLANT
BLADE SURG 15 STRL SS (BLADE) ×6
BNDG CMPR 12X4 ELC STRL LF (DISPOSABLE) ×1
BNDG CMPR MED 5X4 ELC HKLP NS (GAUZE/BANDAGES/DRESSINGS) ×1
BNDG CONFORM 2 STRL LF (GAUZE/BANDAGES/DRESSINGS) ×3 IMPLANT
BNDG ESMARK 4X12 BLUE STRL LF (DISPOSABLE) ×3
BNDG GAUZE ELAST 4 BULKY (GAUZE/BANDAGES/DRESSINGS) ×3 IMPLANT
BOOT STEPPER DURA LG (SOFTGOODS) IMPLANT
BOOT STEPPER DURA XLG (SOFTGOODS) ×2 IMPLANT
CHLORAPREP W/TINT 26ML (MISCELLANEOUS) ×3 IMPLANT
CLOSURE WOUND 1/2 X4 (GAUZE/BANDAGES/DRESSINGS) ×1
CLOTH BEACON ORANGE TIMEOUT ST (SAFETY) ×3 IMPLANT
COVER LIGHT HANDLE STERIS (MISCELLANEOUS) ×6 IMPLANT
CUFF TOURNIQUET SINGLE 18IN (TOURNIQUET CUFF) ×2 IMPLANT
DECANTER SPIKE VIAL GLASS SM (MISCELLANEOUS) ×3 IMPLANT
DRAPE OEC MINIVIEW 54X84 (DRAPES) ×3 IMPLANT
DRSG ADAPTIC 3X8 NADH LF (GAUZE/BANDAGES/DRESSINGS) ×3 IMPLANT
ELECT REM PT RETURN 9FT ADLT (ELECTROSURGICAL) ×3
ELECTRODE REM PT RTRN 9FT ADLT (ELECTROSURGICAL) ×1 IMPLANT
GAUZE SPONGE 4X4 12PLY STRL (GAUZE/BANDAGES/DRESSINGS) ×3 IMPLANT
GLOVE BIO SURGEON STRL SZ7 (GLOVE) ×3 IMPLANT
GLOVE BIO SURGEON STRL SZ7.5 (GLOVE) ×3 IMPLANT
GLOVE BIOGEL PI IND STRL 7.0 (GLOVE) ×2 IMPLANT
GLOVE BIOGEL PI INDICATOR 7.0 (GLOVE) ×4
GLOVE ECLIPSE 6.5 STRL STRAW (GLOVE) ×2 IMPLANT
GLOVE ECLIPSE 7.0 STRL STRAW (GLOVE) ×3 IMPLANT
GOWN STRL REUS W/ TWL LRG LVL3 (GOWN DISPOSABLE) ×1 IMPLANT
GOWN STRL REUS W/TWL LRG LVL3 (GOWN DISPOSABLE) ×9 IMPLANT
KIT TURNOVER CYSTO (KITS) ×3 IMPLANT
MANIFOLD NEPTUNE II (INSTRUMENTS) ×3 IMPLANT
NDL HYPO 27GX1-1/4 (NEEDLE) ×3 IMPLANT
NEEDLE HYPO 27GX1-1/4 (NEEDLE) ×9 IMPLANT
NS IRRIG 1000ML POUR BTL (IV SOLUTION) ×3 IMPLANT
PACK BASIC LIMB (CUSTOM PROCEDURE TRAY) ×3 IMPLANT
PAD ARMBOARD 7.5X6 YLW CONV (MISCELLANEOUS) ×3 IMPLANT
RASP SM TEAR CROSS CUT (RASP) ×2 IMPLANT
SET BASIN LINEN APH (SET/KITS/TRAYS/PACK) ×3 IMPLANT
SPONGE LAP 18X18 RF (DISPOSABLE) ×3 IMPLANT
STRIP CLOSURE SKIN 1/2X4 (GAUZE/BANDAGES/DRESSINGS) ×3 IMPLANT
SUT ETHIBOND 3 0 (SUTURE) ×2 IMPLANT
SUT ETHILON 4 0 P 3 18 (SUTURE) ×3 IMPLANT
SUT PROLENE 4 0 PS 2 18 (SUTURE) ×2 IMPLANT
SUT VIC AB 4-0 PS2 27 (SUTURE) ×2 IMPLANT
SYR CONTROL 10ML LL (SYRINGE) ×6 IMPLANT

## 2018-04-03 NOTE — Op Note (Signed)
OPERATIVE REPORT  DATE OF PROCEDURE 04/03/2018  SURGEON Marcheta Grammes, DPM  ASSISTANT SURGEON None  OR STAFF Circulator: Cox, Gershon Mussel, RN Scrub Person: Lucie Leather, CST RN First Assistant: Towanda Malkin, RN   PREOPERATIVE DIAGNOSIS 1.  Exostosis, right calcaneus 2.  Pain, right foot  POSTOPERATIVE DIAGNOSIS 1.  Exostosis, right calcaneus 2.  Longitudinal tear of peroneus longus tendon, right foot 3.  Pain, right foot  PROCEDURE 1.  Exostectomy, right foot 2.  Repair of longitudinal tear of peroneus longus tendon, right foot  ANESTHESIA Monitor Anesthesia Care   HEMOSTASIS Pneumatic ankle tourniquet set at 250 mmHg  ESTIMATED BLOOD LOSS Minimal (<5 cc)  MATERIALS USED None  INJECTABLES 0.5% Marcaine plain  PATHOLOGY Exostosis, right calcaneus  COMPLICATIONS None  INDICATIONS:  Symptomatic exostosis lateral aspect of the right foot.  MRI failed to reveal longitudinal tear of the peroneal tendons.  DESCRIPTION OF THE PROCEDURE:  The patient was brought to the operating room and placed on the operative table in the supine position.  A pneumatic ankle tourniquet was applied to the operative extremity.  Following sedation, the surgical site was anesthetized with 0.5% Marcaine plain.  The foot was then prepped, scrubbed, and draped in the usual sterile technique.  The foot was elevated, exsanguinated and the pneumatic ankle tourniquet inflated to 250 mmHg.    Attention was directed to the lateral aspect of the right heel.  A curvilinear incision was made overlying the prominent exostosis.  Dissection was continued deep down to the level of the peroneal tendons.  A curvilinear incision was made along the peroneal tendon sheath.  Both peroneal tendons were evaluated.  The peroneus brevis tendon was found to be free of any longitudinal tear.  There was a 0.6 cm longitudinal tear of the peroneus longus tendon inferior to the peroneal tubercle.  The peroneal  tubercle was found to be hypertrophied.  Using a power bone saw the hypertrophic tubercle/exostosis was resected and passed from the operative field.  The bone was sent to pathology for evaluation.  The area was smoothed with a power rasp.  A longitudinal tear of the peroneus longus tendon was excised using a #15 blade.  The tendon was repaired with 2-0 Ethibond.  The surgical wound was irrigated with copious amounts of sterile irrigant.  Surgical site was evaluated with fluoroscopy.  Resection was found to be appropriate.  The peroneal tendon sheath was reapproximated using 4-0 Vicryl and a running simple suture technique.  The subcutaneous structures were reapproximated using 4-0 Vicryl in a simple suture technique.  The incision was closed with 4-0 Vicryl in a running subcuticular manner.  The incision was reinforced with 4-0 Prolene and Steri-Strips.  A sterile compressive dressing was applied to the right foot.  The pneumatic ankle tourniquet was deflated prompt hyperemic response was noted to all digits of the right foot.     The patient tolerated the procedure well.  The patient was then transferred to PACU with vital signs stable and vascular status intact to all toes of the operative foot.

## 2018-04-03 NOTE — Anesthesia Postprocedure Evaluation (Signed)
Anesthesia Post Note  Patient: Jonathan Calhoun  Procedure(s) Performed: OSTECTOMY RIGHT CALCANEUS (Right Foot) EXPLORATION OF PERONEAL TENDON WITH POSSIBLE REPAIR OF LONGITUDINAL TEAR OF THE PERONEAL TENDON RIGHT FOOT (Right Foot)  Patient location during evaluation: PACU Anesthesia Type: MAC Level of consciousness: awake and alert and oriented Pain management: pain level controlled Vital Signs Assessment: post-procedure vital signs reviewed and stable Respiratory status: spontaneous breathing Cardiovascular status: stable Postop Assessment: no apparent nausea or vomiting Anesthetic complications: no     Last Vitals:  Vitals:   04/03/18 0639  BP: 126/84  Pulse: 61  Resp: 16  Temp: 36.4 C  SpO2: 97%    Last Pain:  Vitals:   04/03/18 0639  TempSrc: Oral  PainSc: 0-No pain                 ADAMS, AMY A

## 2018-04-03 NOTE — Transfer of Care (Signed)
Immediate Anesthesia Transfer of Care Note  Patient: [name]  Procedure(s) Performed: OSTECTOMY RIGHT CALCANEUS (Right Foot) EXPLORATION OF PERONEAL TENDON WITH POSSIBLE REPAIR OF LONGITUDINAL TEAR OF THE PERONEAL TENDON RIGHT FOOT (Right Foot)  Patient Location: PACU  Anesthesia Type:MAC  Level of Consciousness: awake, alert , oriented and patient cooperative  Airway & Oxygen Therapy: Patient Spontanous Breathing  Post-op Assessment: Report given to RN and Post -op Vital signs reviewed and stable  Post vital signs: Reviewed and stable  Last Vitals:  Vitals Value Taken Time  BP    Temp    Pulse    Resp    SpO2      Last Pain:  Vitals:   04/03/18 0639  TempSrc: Oral  PainSc: 0-No pain      Patients Stated Pain Goal: 7 (47/[id number]/46 [id number])  Complications: No apparent anesthesia complications

## 2018-04-03 NOTE — Brief Op Note (Signed)
BRIEF OPERATIVE NOTE  DATE OF PROCEDURE 04/03/2018  SURGEON Marcheta Grammes, DPM  ASSISTANT SURGEON None  OR STAFF Circulator: Cox, Gershon Mussel, RN Scrub Person: Lucie Leather, CST RN First Assistant: Towanda Malkin, RN   PREOPERATIVE DIAGNOSIS 1.  Exostosis, right calcaneus 2.  Pain, right foot  POSTOPERATIVE DIAGNOSIS 1.  Exostosis, right calcaneus 2.  Longitudinal tear of peroneus longus tendon, right foot 3.  Pain, right foot  PROCEDURE 1.  Exostectomy, right foot 2.  Repair of longitudinal tear of peroneus longus tendon, right foot  ANESTHESIA Monitor Anesthesia Care   HEMOSTASIS Pneumatic ankle tourniquet set at 250 mmHg  ESTIMATED BLOOD LOSS Minimal (<5 cc)  MATERIALS USED None  INJECTABLES 0.5% Marcaine plain  PATHOLOGY Exostosis, right calcaneus  COMPLICATIONS None

## 2018-04-03 NOTE — Anesthesia Procedure Notes (Signed)
Procedure Name: MAC Date/Time: 04/03/2018 7:28 AM Performed by: Andree Elk Amy A, CRNA Pre-anesthesia Checklist: Patient identified, Emergency Drugs available, Suction available, Timeout performed and Patient being monitored Patient Re-evaluated:Patient Re-evaluated prior to induction Oxygen Delivery Method: Non-rebreather mask

## 2018-04-03 NOTE — Discharge Instructions (Addendum)
These instructions will give you an idea of what to expect after surgery and how to manage issues that may arise before your first post op office visit. ° °Pain Management °Pain is best managed by “staying ahead” of it. If pain gets out of control, it is difficult to get it back under control. Local anesthesia that lasts 6-8 hours is used to numb the foot and decrease pain.  For the best pain control, take the pain medication every 4 hours for the first 2 days post op. On the third day pain medication can be taken as needed.  ° °Post Op Nausea °Nausea is common after surgery, so it is managed proactively.  °If prescribed, use the prescribed nausea medication regularly for the first 2 days post op. ° °Bandages °Do not worry if there is blood on the bandage. What looks like a lot of blood on the bandage is actually a small amount. Blood on the dressing spreads out as it is absorbed by the gauze, the same way a drop of water spreads out on a paper towel.  °If the bandages feel wet or dry, stiff and uncomfortable, call the office during office hours and we will schedule a time for you to have the bandage changed.  °Unless you are specifically told otherwise, we will do the first bandage change in the office.  °Keep your bandage dry. If the bandage becomes wet or soiled, notify the office and we will schedule a time to change the bandage. ° °Activity °It is best to spend most of the first 2 days after surgery lying down with the foot elevated above the level of your heart. °You may put weight on your heel while wearing the CAM Walker (black boot).   °You may only get up to go to the restroom. ° °Driving °Do not drive until you are able to respond in an emergency (i.e. slam on the brakes). This usually occurs after the bone has healed - 6 to 8 weeks. ° °Call the Office °If you have a fever over 101°F.  °If you have increasing pain after the initial post op pain has settled down.  °If you have increasing redness, swelling,  or drainage.  °If you have any questions or concerns.  ° ° °PATIENT INSTRUCTIONS °POST-ANESTHESIA ° °IMMEDIATELY FOLLOWING SURGERY:  Do not drive or operate machinery for the first twenty four hours after surgery.  Do not make any important decisions for twenty four hours after surgery or while taking narcotic pain medications or sedatives.  If you develop intractable nausea and vomiting or a severe headache please notify your doctor immediately. ° °FOLLOW-UP:  Please make an appointment with your surgeon as instructed. You do not need to follow up with anesthesia unless specifically instructed to do so. ° °WOUND CARE INSTRUCTIONS (if applicable):  Keep a dry clean dressing on the anesthesia/puncture wound site if there is drainage.  Once the wound has quit draining you may leave it open to air.  Generally you should leave the bandage intact for twenty four hours unless there is drainage.  If the epidural site drains for more than 36-48 hours please call the anesthesia department. ° °QUESTIONS?:  Please feel free to call your physician or the hospital operator if you have any questions, and they will be happy to assist you.    ° ° ° °

## 2018-04-03 NOTE — Anesthesia Preprocedure Evaluation (Signed)
Anesthesia Evaluation    Airway Mallampati: II       Dental  (+) Teeth Intact, Chipped, Dental Advidsory Given   Pulmonary Current Smoker,    breath sounds clear to auscultation       Cardiovascular hypertension, On Medications  Rhythm:regular     Neuro/Psych PSYCHIATRIC DISORDERS Anxiety Depression    GI/Hepatic   Endo/Other  diabetes, Type 2  Renal/GU      Musculoskeletal   Abdominal   Peds  Hematology   Anesthesia Other Findings Obesity MAC requested by podiatrist  Reproductive/Obstetrics                             Anesthesia Physical Anesthesia Plan  ASA: III  Anesthesia Plan: MAC   Post-op Pain Management:    Induction:   PONV Risk Score and Plan:   Airway Management Planned:   Additional Equipment:   Intra-op Plan:   Post-operative Plan:   Informed Consent:   Plan Discussed with: Anesthesiologist  Anesthesia Plan Comments:         Anesthesia Quick Evaluation

## 2018-04-03 NOTE — H&P (Signed)
HISTORY AND PHYSICAL INTERVAL NOTE:  04/03/2018  7:15 AM  [name]  has presented today for surgery, with the diagnosis of exostosis right calcaneus, right foot pain.  The various methods of treatment have been discussed with the patient.  No guarantees were given.  After consideration of risks, benefits and other options for treatment, the patient has consented to surgery.  I have reviewed the patients' chart and labs.    Patient Vitals for the past 24 hrs:  BP Temp Temp src Pulse Resp SpO2  04/03/18 0639 126/84 97.6 F (36.4 C) Oral 61 16 97 %    A history and physical examination was performed in my office.  The patient was reexamined.  There have been no changes to this history and physical examination.  Marcheta Grammes, DPM

## 2018-04-04 ENCOUNTER — Encounter (HOSPITAL_COMMUNITY): Payer: Self-pay | Admitting: Podiatry

## 2018-04-11 ENCOUNTER — Other Ambulatory Visit: Payer: Self-pay

## 2018-05-20 ENCOUNTER — Other Ambulatory Visit: Payer: Self-pay

## 2018-05-20 DIAGNOSIS — I872 Venous insufficiency (chronic) (peripheral): Secondary | ICD-10-CM

## 2018-05-23 DIAGNOSIS — E119 Type 2 diabetes mellitus without complications: Secondary | ICD-10-CM | POA: Diagnosis not present

## 2018-05-23 DIAGNOSIS — Z7984 Long term (current) use of oral hypoglycemic drugs: Secondary | ICD-10-CM | POA: Diagnosis not present

## 2018-05-23 DIAGNOSIS — H43812 Vitreous degeneration, left eye: Secondary | ICD-10-CM | POA: Diagnosis not present

## 2018-05-23 DIAGNOSIS — H25813 Combined forms of age-related cataract, bilateral: Secondary | ICD-10-CM | POA: Diagnosis not present

## 2018-05-23 LAB — HM DIABETES EYE EXAM

## 2018-05-24 ENCOUNTER — Other Ambulatory Visit: Payer: Self-pay | Admitting: Adult Health

## 2018-05-24 DIAGNOSIS — E1169 Type 2 diabetes mellitus with other specified complication: Secondary | ICD-10-CM

## 2018-05-24 DIAGNOSIS — Z760 Encounter for issue of repeat prescription: Secondary | ICD-10-CM

## 2018-05-24 MED FILL — glipiZIDE 5 MG TABS: 5 | 30 days supply | Qty: 60 | Fill #0

## 2018-05-24 MED FILL — metFORMIN HCL 1000 MG TABS: 1000 | 30 days supply | Qty: 60 | Fill #0

## 2018-05-24 MED FILL — LISINOPRIL 5 MG TABLET: 5 | 90 days supply | Qty: 90 | Fill #2

## 2018-05-24 MED FILL — CITALOPRAM HBR 20 MG TABLET: 20 | 90 days supply | Qty: 90 | Fill #0

## 2018-05-24 NOTE — Telephone Encounter (Signed)
Spoke to the pt and scheduled him for A1C follow up on 05/30/18.  30 day rx given for diabetes meds as the pt will be out of meds on 05/27/18.  Citalopram sent for 6 months.  Pt due for cpx 09/2018.  Nothing further needed at this time.

## 2018-05-29 MED FILL — ATORVASTATIN CALCIUM 20 MG: 20 | 90 days supply | Qty: 90 | Fill #1

## 2018-05-30 ENCOUNTER — Ambulatory Visit (INDEPENDENT_AMBULATORY_CARE_PROVIDER_SITE_OTHER): Payer: Medicare Other | Admitting: Adult Health

## 2018-05-30 ENCOUNTER — Encounter: Payer: Self-pay | Admitting: Adult Health

## 2018-05-30 VITALS — BP 118/78 | Temp 98.4°F | Wt 238.0 lb

## 2018-05-30 DIAGNOSIS — E1169 Type 2 diabetes mellitus with other specified complication: Secondary | ICD-10-CM

## 2018-05-30 DIAGNOSIS — Z23 Encounter for immunization: Secondary | ICD-10-CM

## 2018-05-30 LAB — POCT GLYCOSYLATED HEMOGLOBIN (HGB A1C): HbA1c, POC (controlled diabetic range): 5.9 % (ref 0.0–7.0)

## 2018-05-30 MED ORDER — METFORMIN HCL 1000 MG PO TABS: ORAL_TABLET | ORAL | 1 refills | Status: DC

## 2018-05-30 MED ORDER — GLIPIZIDE 5 MG PO TABS: 5.0000 mg | ORAL_TABLET | Freq: Two times a day (BID) | ORAL | 1 refills | Status: DC

## 2018-05-30 NOTE — Progress Notes (Signed)
Subjective:    Patient ID: CHRITOPHER Calhoun, male    DOB: [birth year], 68 y.o.   MRN: [id number]  HPI 67 year old male who  has a past medical history of Allergy, Anxiety, Depression, Diabetes mellitus, History of alcoholism ([place]), History of shingles, Hyperlipidemia, Hypertension, and Ileus, postoperative ([place]).  He presents to the office today for follow up regarding DM. He is currently prescribed glipizide 5 mg and Metformin 1000 mg BID. He denies feelings of hypoglycemia. He has not been exercising due to recent foot surgery.   He has had his diabetic eye exam this year.    Lab Results  Component Value Date   HGBA1C 6.0 (H) 04/02/2018    Review of Systems See HPI   Past Medical History:  Diagnosis Date  . Allergy   . Anxiety   . Depression   . Diabetes mellitus    ORAL MEDS-NO INSULIN  . History of alcoholism ([place])   . History of shingles   . Hyperlipidemia   . Hypertension   . Ileus, postoperative ([place])    Post-op Chole Surgery    Social History   Socioeconomic History  . Marital status: Married    Spouse name: Not on file  . Number of children: Not on file  . Years of education: Not on file  . Highest education level: Not on file  Occupational History  . Not on file  Social Needs  . Financial resource strain: Not on file  . Food insecurity:    Worry: Not on file    Inability: Not on file  . Transportation needs:    Medical: Not on file    Non-medical: Not on file  Tobacco Use  . Smoking status: Current Every Day Smoker    Packs/day: 2.00    Types: Cigars  . Smokeless tobacco: Never Used  Substance and Sexual Activity  . Alcohol use: No    Comment: quit 3 yrs ago  . Drug use: No  . Sexual activity: Not on file  Lifestyle  . Physical activity:    Days per week: Not on file    Minutes per session: Not on file  . Stress: Not on file  Relationships  . Social connections:    Talks on phone: Not on file    Gets together: Not on file    Attends  religious service: Not on file    Active member of club or organization: Not on file    Attends meetings of clubs or organizations: Not on file    Relationship status: Not on file  . Intimate partner violence:    Fear of current or ex partner: Not on file    Emotionally abused: Not on file    Physically abused: Not on file    Forced sexual activity: Not on file  Other Topics Concern  . Not on file  Social History Narrative   Married, lives with spouse.   Plans for home after surgery.   Living will, healthcare POA.    Past Surgical History:  Procedure Laterality Date  . BONE EXOSTOSIS EXCISION  08/10/2011   Procedure: EXOSTOSIS EXCISION;  Surgeon: Marcheta Grammes, DPM;  Location: [facility];  Service: Orthopedics;  Laterality: Left;  Exostectomy of Calcaneous Left Foot  . CARDIAC CATHETERIZATION  2002  . CHOLECYSTECTOMY    . OSTECTOMY Right 04/03/2018   Procedure: OSTECTOMY RIGHT CALCANEUS;  Surgeon: Caprice Beaver, DPM;  Location: [facility];  Service: Podiatry;  Laterality: Right;  . RIGHT  SHOULDER SURGERY FOR TEAR AND SPUR - YRS AGO    . SYNOVECTOMY  08/10/2011   Procedure: SYNOVECTOMY;  Surgeon: Marcheta Grammes, DPM;  Location: [facility];  Service: Orthopedics;  Laterality: Left;  Synovectomy of Peroneal Tendon Left Foot  . TENDON REPAIR  08/10/2011   Procedure: TENDON REPAIR;  Surgeon: Marcheta Grammes, DPM;  Location: [facility];  Service: Orthopedics;  Laterality: Left;  Repair of Peroneal Tendon Left Foot  . TENDON REPAIR Right 04/03/2018   Procedure: EXPLORATION OF PERONEAL TENDON WITH POSSIBLE REPAIR OF LONGITUDINAL TEAR OF THE PERONEAL TENDON RIGHT FOOT;  Surgeon: Caprice Beaver, DPM;  Location: [facility];  Service: Podiatry;  Laterality: Right;  . TONSILLECTOMY    . TOTAL HIP ARTHROPLASTY  03/29/2011   Procedure: TOTAL HIP ARTHROPLASTY;  Surgeon: Dione Plover Aluisio;  Location: [facility];  Service: Orthopedics;  Laterality: Right;    Family History  Problem Relation  Age of Onset  . Depression Mother   . Cancer Mother        colon  . Heart failure Mother   . Colon cancer Mother   . Coronary artery disease Father   . Hypertension Father     No Known Allergies  Current Outpatient Medications on File Prior to Visit  Medication Sig Dispense Refill  . atorvastatin (LIPITOR) 20 MG tablet TAKE 1 TABLET BY MOUTH DAILY. 90 tablet 3  . citalopram (CELEXA) 20 MG tablet TAKE 1 TABLET BY MOUTH DAILY. 90 tablet 1  . glipiZIDE (GLUCOTROL) 5 MG tablet TAKE 1 TABLET BY MOUTH 2 TIMES DAILY BEFORE A MEAL. NEEDS OFFICE VISIT FOR FURTHER REFILLS 60 tablet 0  . glucose blood test strip Use as instructed 100 each 3  . lisinopril (PRINIVIL,ZESTRIL) 5 MG tablet Take 1 tablet (5 mg total) by mouth every morning. 90 tablet 3  . metFORMIN (GLUCOPHAGE) 1000 MG tablet TAKE 1 TABLET BY MOUTH 2 TIMES DAILY WITH A MEAL. NEEDS OFFICE VISIT FOR FURTHER REFILLS 60 tablet 0  . ONETOUCH DELICA LANCETS MISC by Does not apply route.       No current facility-administered medications on file prior to visit.     BP (!) 150/80   Temp 98.4 F (36.9 C)   Wt 238 lb (108 kg) Comment: PT REPORTED  BMI 30.56 kg/m       Objective:   Physical Exam Vitals signs and nursing note reviewed.  Constitutional:      Appearance: Normal appearance.  Cardiovascular:     Rate and Rhythm: Normal rate and regular rhythm.  Pulmonary:     Effort: Pulmonary effort is normal.     Breath sounds: Normal breath sounds.  Skin:    General: Skin is warm.     Capillary Refill: Capillary refill takes less than 2 seconds.  Neurological:     General: No focal deficit present.     Mental Status: He is alert and oriented to person, place, and time.  Psychiatric:        Mood and Affect: Mood normal.        Behavior: Behavior normal.        Thought Content: Thought content normal.        Judgment: Judgment normal.       Assessment & Plan:  1. Type 2 diabetes mellitus with other specified complication,  unspecified whether long term insulin use (HCC)  - POCT A1C- 5.9  - A1c has improved significantly. Will recheck at CPE in May  - glipiZIDE (GLUCOTROL) 5  MG tablet; Take 1 tablet (5 mg total) by mouth 2 (two) times daily before a meal.  Dispense: 180 tablet; Refill: 1 - metFORMIN (GLUCOPHAGE) 1000 MG tablet; TAKE 1 TABLET BY MOUTH 2 TIMES DAILY WITH A MEAL.  Dispense: 180 tablet; Refill: 1  2. Need for influenza vaccination  - Flu vaccine HIGH DOSE PF (Fluzone High dose)  [name], NP

## 2018-06-24 MED FILL — glipiZIDE 5 MG TABS: 5 | 90 days supply | Qty: 180 | Fill #0

## 2018-06-24 MED FILL — metFORMIN HCL 1000 MG TABS: 1000 | 90 days supply | Qty: 180 | Fill #0

## 2018-07-02 ENCOUNTER — Ambulatory Visit (HOSPITAL_COMMUNITY)
Admission: RE | Admit: 2018-07-02 | Discharge: 2018-07-02 | Disposition: A | Discharge status: Home / Self Care | Payer: Medicare Other | Source: Ambulatory Visit | Attending: Family | Admitting: Family

## 2018-07-02 ENCOUNTER — Other Ambulatory Visit: Payer: Self-pay

## 2018-07-02 ENCOUNTER — Encounter: Payer: Self-pay | Admitting: Vascular Surgery

## 2018-07-02 ENCOUNTER — Ambulatory Visit (INDEPENDENT_AMBULATORY_CARE_PROVIDER_SITE_OTHER): Payer: Medicare Other | Admitting: Vascular Surgery

## 2018-07-02 VITALS — BP 119/72 | HR 72 | Temp 98.3°F | Resp 20 | Ht 74.0 in | Wt 238.0 lb

## 2018-07-02 DIAGNOSIS — I872 Venous insufficiency (chronic) (peripheral): Secondary | ICD-10-CM

## 2018-07-02 DIAGNOSIS — I8312 Varicose veins of left lower extremity with inflammation: Secondary | ICD-10-CM | POA: Diagnosis not present

## 2018-07-02 DIAGNOSIS — I83813 Varicose veins of bilateral lower extremities with pain: Secondary | ICD-10-CM

## 2018-07-02 NOTE — Progress Notes (Signed)
Vascular and [facility]in Specialist of [facility]  Patient name: Jonathan Calhoun MRN: [id number] DOB: 07-03-1950 Sex: male  REASON FOR CONSULT: Seen today for evaluation of left leg venous hypertension and complications of varicose veins  HPI: BROGEN DUELL is a 68 y.o. male, who is he reports long history of increasing varicosities in his lower extremity on the left.  This extends both in his posterior thigh and medial and pretibial calf.  He reports more skin changes around his ankle and full feeling with his standing.  He is seen today for further discussion of this.  He does not have any history of DVT and has not developed any prior venous ulceration  Past Medical History:  Diagnosis Date  . Allergy   . Anxiety   . Depression   . Diabetes mellitus    ORAL MEDS-NO INSULIN  . History of alcoholism ([place])   . History of shingles   . Hyperlipidemia   . Hypertension   . Ileus, postoperative (HCC)    Post-op Chole Surgery    Family History  Problem Relation Age of Onset  . Depression Mother   . Cancer Mother        colon  . Heart failure Mother   . Colon cancer Mother   . Coronary artery disease Father   . Hypertension Father     SOCIAL HISTORY: Social History   Socioeconomic History  . Marital status: Married    Spouse name: Not on file  . Number of children: Not on file  . Years of education: Not on file  . Highest education level: Not on file  Occupational History  . Not on file  Social Needs  . Financial resource strain: Not on file  . Food insecurity:    Worry: Not on file    Inability: Not on file  . Transportation needs:    Medical: Not on file    Non-medical: Not on file  Tobacco Use  . Smoking status: Current Every Day Smoker    Packs/day: 0.00    Types: Cigars  . Smokeless tobacco: Never Used  Substance and Sexual Activity  . Alcohol use: No    Comment: quit 3 yrs ago  . Drug use: No  . Sexual activity: Not on file   Lifestyle  . Physical activity:    Days per week: Not on file    Minutes per session: Not on file  . Stress: Not on file  Relationships  . Social connections:    Talks on phone: Not on file    Gets together: Not on file    Attends religious service: Not on file    Active member of club or organization: Not on file    Attends meetings of clubs or organizations: Not on file    Relationship status: Not on file  . Intimate partner violence:    Fear of current or ex partner: Not on file    Emotionally abused: Not on file    Physically abused: Not on file    Forced sexual activity: Not on file  Other Topics Concern  . Not on file  Social History Narrative   Married, lives with spouse.   Plans for home after surgery.   Living will, healthcare POA.    No Known Allergies  Current Outpatient Medications  Medication Sig Dispense Refill  . atorvastatin (LIPITOR) 20 MG tablet TAKE 1 TABLET BY MOUTH DAILY. 90 tablet 3  . citalopram (CELEXA) 20 MG tablet TAKE  1 TABLET BY MOUTH DAILY. 90 tablet 1  . glipiZIDE (GLUCOTROL) 5 MG tablet Take 1 tablet (5 mg total) by mouth 2 (two) times daily before a meal. 180 tablet 1  . glucose blood test strip Use as instructed 100 each 3  . lisinopril (PRINIVIL,ZESTRIL) 5 MG tablet Take 1 tablet (5 mg total) by mouth every morning. 90 tablet 3  . metFORMIN (GLUCOPHAGE) 1000 MG tablet TAKE 1 TABLET BY MOUTH 2 TIMES DAILY WITH A MEAL. 180 tablet 1  . ONETOUCH DELICA LANCETS MISC by Does not apply route.       No current facility-administered medications for this visit.     REVIEW OF SYSTEMS:  [X]  denotes positive finding, [ ]  denotes negative finding Cardiac  Comments:  Chest pain or chest pressure:    Shortness of breath upon exertion:    Short of breath when lying flat:    Irregular heart rhythm:        Vascular    Pain in calf, thigh, or hip brought on by ambulation:    Pain in feet at night that wakes you up from your sleep:     Blood clot in  your veins:    Leg swelling:  x       Pulmonary    Oxygen at home:    Productive cough:     Wheezing:         Neurologic    Sudden weakness in arms or legs:     Sudden numbness in arms or legs:     Sudden onset of difficulty speaking or slurred speech:    Temporary loss of vision in one eye:     Problems with dizziness:         Gastrointestinal    Blood in stool:     Vomited blood:         Genitourinary    Burning when urinating:     Blood in urine:        Psychiatric    Major depression:         Hematologic    Bleeding problems:    Problems with blood clotting too easily:        Skin    Rashes or ulcers:        Constitutional    Fever or chills:      PHYSICAL EXAM: Vitals:   07/02/18 1303  BP: 119/72  Pulse: 72  Resp: 20  Temp: 98.3 F (36.8 C)  SpO2: 96%  Weight: 238 lb (108 kg)  Height: 6\' 2"  (1.88 m)    GENERAL: The patient is a well-nourished male, in no acute distress. The vital signs are documented above. CARDIOVASCULAR: 2+ dorsalis pedis pulses bilaterally.  Markedly enlarged varicosities in his posterior medial thigh and also his medial calf and pretibial calf. PULMONARY: There is good air exchange  ABDOMEN: Soft and non-tender  MUSCULOSKELETAL: There are no major deformities or cyanosis. NEUROLOGIC: No focal weakness or paresthesias are detected. SKIN: There are no ulcers or rashes noted.  He does have changes of chronic venous hypertension with hemosiderin deposit and also extensive telangiectasias over his distal calf and foot PSYCHIATRIC: The patient has a normal affect.  DATA:  Venous duplex on the left today showed no evidence of DVT.  He does have reflux throughout his great saphenous vein ranging from 0.5 to 0.8 cm  MEDICAL ISSUES: Long discussion with patient regarding the significance of his venous hypertension.  I explained that this does not  put him at increased risk for DVT.  He is having changes of chronic venous stasis in his  ankle and is concerned about the progression to this of ulceration.  He certainly does not appear to be at imminent risk for this.  He has not worn compression garments.  He was fitted today in our office with 20 to 30 mm thigh-high graduated compression garments instructed on the daily use of these.  We will see him again in 3 months for continued discussion.  Feel that he would be an excellent candidate of laser ablation of his great saphenous vein and stab phlebectomy of tributary varicosities for symptom relief.  He will see either Dr. Scot Dock or Dr. Oneida Alar in 3 months for continued discussion  CEAP class 4   Rosetta Posner, MD [facility] and Vein Specialists of [facility] Tel [phone] Pager [phone]

## 2018-07-25 ENCOUNTER — Encounter: Payer: Self-pay | Admitting: Family Medicine

## 2018-08-22 MED FILL — ATORVASTATIN 20 MG TABLET: 20 | 90 days supply | Qty: 90 | Fill #0

## 2018-08-22 MED FILL — CITALOPRAM HBR 20 MG TABLET: 20 | 90 days supply | Qty: 90 | Fill #0

## 2018-08-22 MED FILL — LISINOPRIL 5 MG TABLET: 5 | 90 days supply | Qty: 90 | Fill #0

## 2018-09-11 ENCOUNTER — Other Ambulatory Visit: Payer: Self-pay | Admitting: Family Medicine

## 2018-09-11 DIAGNOSIS — E1169 Type 2 diabetes mellitus with other specified complication: Secondary | ICD-10-CM

## 2018-09-17 ENCOUNTER — Other Ambulatory Visit: Payer: Self-pay

## 2018-09-17 ENCOUNTER — Other Ambulatory Visit (INDEPENDENT_AMBULATORY_CARE_PROVIDER_SITE_OTHER): Payer: Medicare Other

## 2018-09-17 DIAGNOSIS — E1169 Type 2 diabetes mellitus with other specified complication: Secondary | ICD-10-CM

## 2018-09-17 LAB — HEMOGLOBIN A1C: Hgb A1c MFr Bld: 6.6 % — ABNORMAL HIGH (ref 4.6–6.5)

## 2018-09-18 ENCOUNTER — Ambulatory Visit (INDEPENDENT_AMBULATORY_CARE_PROVIDER_SITE_OTHER): Payer: Medicare Other | Admitting: Adult Health

## 2018-09-18 ENCOUNTER — Encounter: Payer: Self-pay | Admitting: Adult Health

## 2018-09-18 ENCOUNTER — Other Ambulatory Visit: Payer: Self-pay

## 2018-09-18 DIAGNOSIS — E1169 Type 2 diabetes mellitus with other specified complication: Secondary | ICD-10-CM | POA: Diagnosis not present

## 2018-09-18 NOTE — Progress Notes (Signed)
Virtual Visit via Video Note  I connected with Jonathan Calhoun on 09/18/18 at 11:00 AM EDT by a video enabled telemedicine application and verified that I am speaking with the correct person using two identifiers.  Location patient: home Location provider:work or home office Persons participating in the virtual visit: patient, provider  I discussed the limitations of evaluation and management by telemedicine and the availability of in person appointments. The patient expressed understanding and agreed to proceed.   HPI: 68 year old male who being evaluated today for 42-month follow-up regarding diabetes mellitus.  He is currently maintained on glipizide 5 mg twice daily and metformin 1000 mg twice daily.  Last A1c in January 2020 was 5.9.  Reports over the last 3 months since COVID restrictions have been in place his diet has not been good and he is gained about 10 pounds.  He has started walking again since he noticed a weight gain and walked approximately 2 Calhoun yesterday.  He is not checking his blood sugars at home.     ROS: See pertinent positives and negatives per HPI.  Past Medical History:  Diagnosis Date  . Allergy   . Anxiety   . Depression   . Diabetes mellitus    ORAL MEDS-NO INSULIN  . History of alcoholism ([place])   . History of shingles   . Hyperlipidemia   . Hypertension   . Ileus, postoperative ([place])    Post-op Chole Surgery    Past Surgical History:  Procedure Laterality Date  . BONE EXOSTOSIS EXCISION  08/10/2011   Procedure: EXOSTOSIS EXCISION;  Surgeon: Marcheta Grammes, DPM;  Location: [facility];  Service: Orthopedics;  Laterality: Left;  Exostectomy of Calcaneous Left Foot  . CARDIAC CATHETERIZATION  2002  . CHOLECYSTECTOMY    . OSTECTOMY Right 04/03/2018   Procedure: OSTECTOMY RIGHT CALCANEUS;  Surgeon: Caprice Beaver, DPM;  Location: [facility];  Service: Podiatry;  Laterality: Right;  . RIGHT SHOULDER SURGERY FOR TEAR AND SPUR - YRS AGO    .  SYNOVECTOMY  08/10/2011   Procedure: SYNOVECTOMY;  Surgeon: Marcheta Grammes, DPM;  Location: [facility];  Service: Orthopedics;  Laterality: Left;  Synovectomy of Peroneal Tendon Left Foot  . TENDON REPAIR  08/10/2011   Procedure: TENDON REPAIR;  Surgeon: Marcheta Grammes, DPM;  Location: [facility];  Service: Orthopedics;  Laterality: Left;  Repair of Peroneal Tendon Left Foot  . TENDON REPAIR Right 04/03/2018   Procedure: EXPLORATION OF PERONEAL TENDON WITH POSSIBLE REPAIR OF LONGITUDINAL TEAR OF THE PERONEAL TENDON RIGHT FOOT;  Surgeon: Caprice Beaver, DPM;  Location: [facility];  Service: Podiatry;  Laterality: Right;  . TONSILLECTOMY    . TOTAL HIP ARTHROPLASTY  03/29/2011   Procedure: TOTAL HIP ARTHROPLASTY;  Surgeon: Dione Plover Aluisio;  Location: [facility];  Service: Orthopedics;  Laterality: Right;    Family History  Problem Relation Age of Onset  . Depression Mother   . Cancer Mother        colon  . Heart failure Mother   . Colon cancer Mother   . Coronary artery disease Father   . Hypertension Father      Current Outpatient Medications:  .  atorvastatin (LIPITOR) 20 MG tablet, TAKE 1 TABLET BY MOUTH DAILY., Disp: 90 tablet, Rfl: 3 .  citalopram (CELEXA) 20 MG tablet, TAKE 1 TABLET BY MOUTH DAILY., Disp: 90 tablet, Rfl: 1 .  glipiZIDE (GLUCOTROL) 5 MG tablet, Take 1 tablet (5 mg total) by mouth 2 (two) times daily before a meal.,  Disp: 180 tablet, Rfl: 1 .  glucose blood test strip, Use as instructed, Disp: 100 each, Rfl: 3 .  lisinopril (PRINIVIL,ZESTRIL) 5 MG tablet, Take 1 tablet (5 mg total) by mouth every morning., Disp: 90 tablet, Rfl: 3 .  metFORMIN (GLUCOPHAGE) 1000 MG tablet, TAKE 1 TABLET BY MOUTH 2 TIMES DAILY WITH A MEAL., Disp: 180 tablet, Rfl: 1 .  ONETOUCH DELICA LANCETS MISC, by Does not apply route.  , Disp: , Rfl:   EXAM:  VITALS per patient if applicable:  GENERAL: alert, oriented, appears well and in no acute distress  HEENT: atraumatic, conjunttiva  clear, no obvious abnormalities on inspection of external nose and ears  NECK: normal movements of the head and neck  LUNGS: on inspection no signs of respiratory distress, breathing rate appears normal, no obvious gross SOB, gasping or wheezing  CV: no obvious cyanosis  MS: moves all visible extremities without noticeable abnormality  PSYCH/NEURO: pleasant and cooperative, no obvious depression or anxiety, speech and thought processing grossly intact  ASSESSMENT AND PLAN:  Discussed the following assessment and plan:    Type 2 diabetes mellitus with other specified complication, unspecified whether long term insulin use (HCC)  A1c is up to 6.6.  He is going to start working on lifestyle modifications over the next 3 months and hopefully will be able to bring him into the office for a complete physical in July.  Encouraged to continue walking and start eating a heart healthy diet.   I discussed the assessment and treatment plan with the patient. The patient was provided an opportunity to ask questions and all were answered. The patient agreed with the plan and demonstrated an understanding of the instructions.   The patient was advised to call back or seek an in-person evaluation if the symptoms worsen or if the condition fails to improve as anticipated.   [name], NP

## 2018-09-24 MED FILL — metFORMIN HCL 1000 MG TABS: 1000 | 90 days supply | Qty: 180 | Fill #0

## 2018-09-24 MED FILL — glipiZIDE 5 MG TABS: 5 | 90 days supply | Qty: 180 | Fill #0

## 2018-09-25 ENCOUNTER — Encounter: Payer: Medicare Other | Admitting: Adult Health

## 2018-10-02 ENCOUNTER — Ambulatory Visit: Payer: Medicare Other | Admitting: Vascular Surgery

## 2018-11-12 IMAGING — MR MR ANKLE*R* W/O CM
5 series · 40 of 40 positions shown · non-contrast
Comparison: None.

CLINICAL DATA: Palpable mass laterally along the ankle with
associated pain.

EXAM:
MRI OF THE RIGHT ANKLE WITHOUT CONTRAST
TECHNIQUE: Multiplanar, multisequence MR imaging of the ankle was performed. No
intravenous contrast was administered.

[Series 3: pdfs axial · axial · 3.0mm · 0.57mm/px · z∈[-87,+48]mm · 9 of 35 slices shown]
[im 1/35]
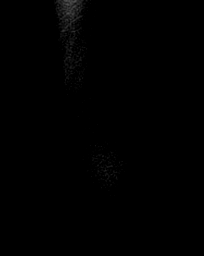
[im 5/35]
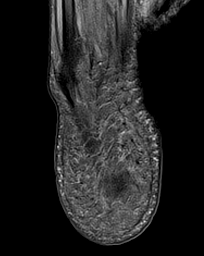
[im 9/35]
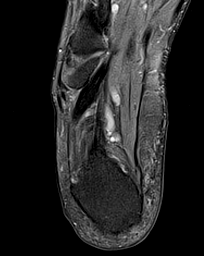
[im 13/35]
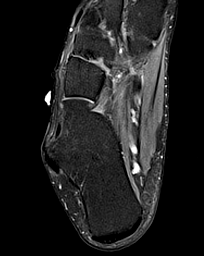
[im 18/35]
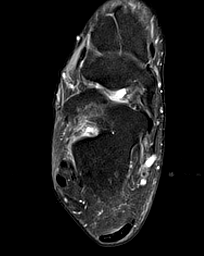
[im 22/35]
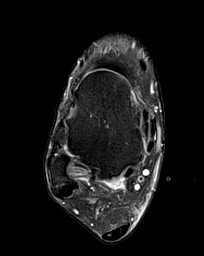
[im 26/35]
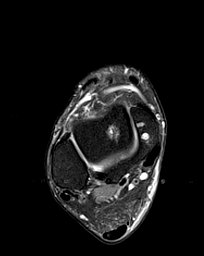
[im 30/35]
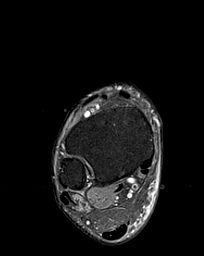
[im 35/35]
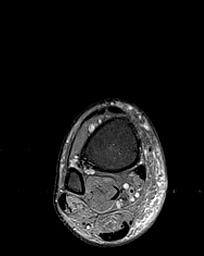

[Series 4: t2fs axial · axial · 3.0mm · 0.46mm/px · z∈[-87,+48]mm · 10 of 35 slices shown]
[im 1/35]
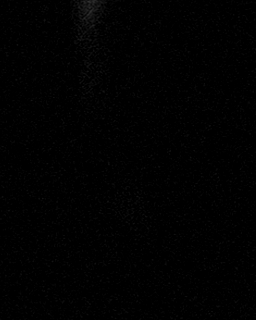
[im 4/35]
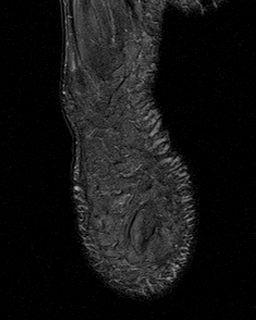
[im 8/35]
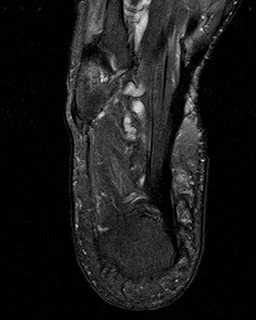
[im 12/35]
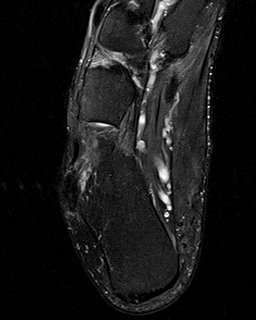
[im 16/35]
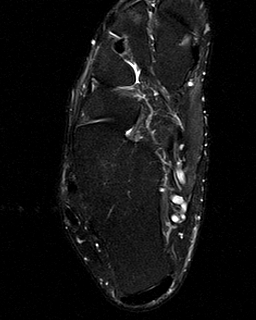
[im 19/35]
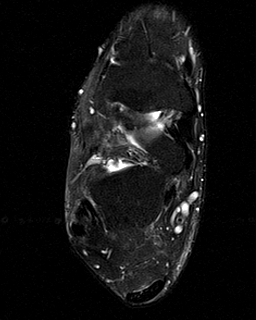
[im 23/35]
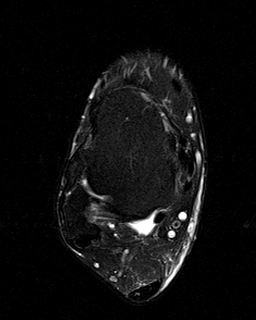
[im 27/35]
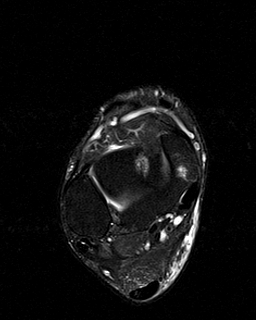
[im 31/35]
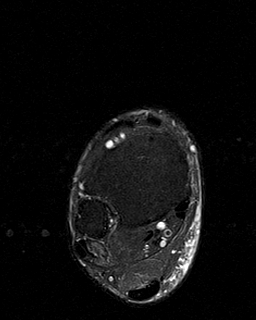
[im 35/35]
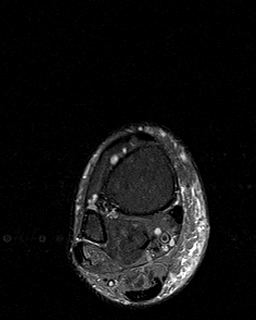

[Series 5: t2fs cor · coronal · 3.0mm · 0.54mm/px · 9 of 30 slices shown]
[im 1/30]
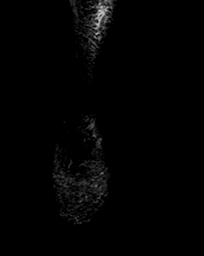
[im 4/30]
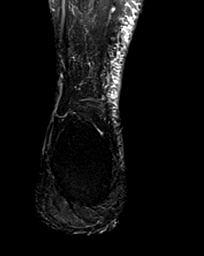
[im 8/30]
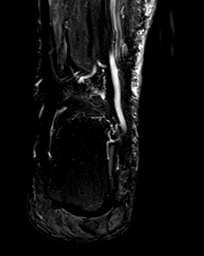
[im 11/30]
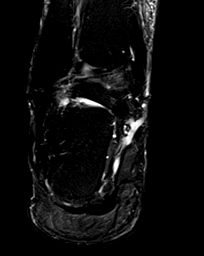
[im 15/30]
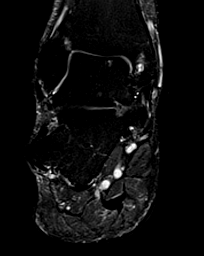
[im 19/30]
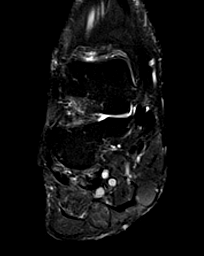
[im 22/30]
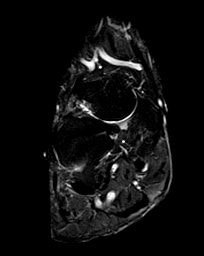
[im 26/30]
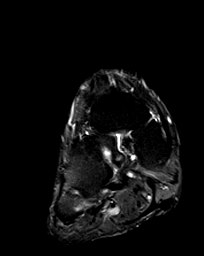
[im 30/30]
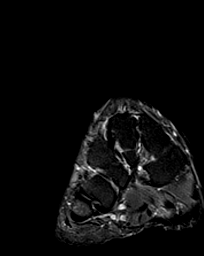

[Series 6: STIR · sagittal · 4.0mm · 0.61mm/px · 6 of 19 slices shown]
[im 1/19]
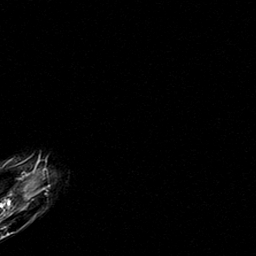
[im 4/19]
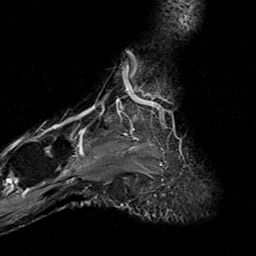
[im 8/19]
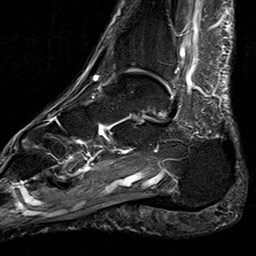
[im 11/19]
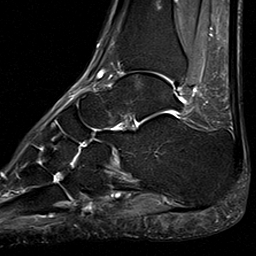
[im 15/19]
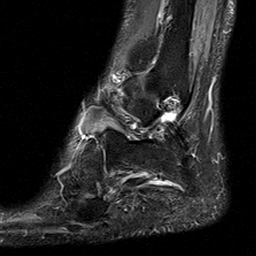
[im 19/19]
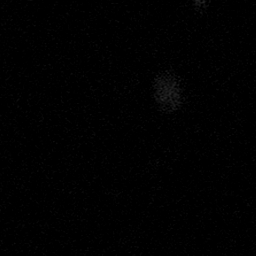

[Series 7: T1 · sagittal · 4.0mm · 0.59mm/px · 6 of 19 slices shown]
[im 1/19]
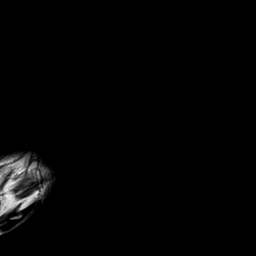
[im 4/19]
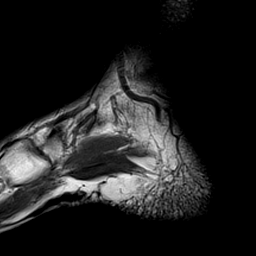
[im 8/19]
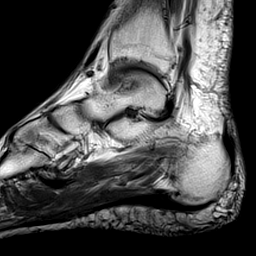
[im 11/19]
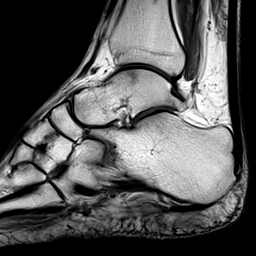
[im 15/19]
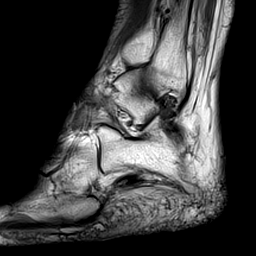
[im 19/19]
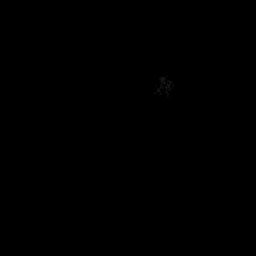

[40 of 40 positions shown; findings below may reference images not displayed]

FINDINGS: TENDONS

Peroneal: There is moderate peroneus longus tendinopathy associated
with posterolateral deviation of the tendon from an enlarged
peroneal tubercle posterior to the site where the patient marks the
palpable abnormality. The peroneus brevis tracks anterior to this
tubercle and appears intact.

Posteromedial: Mild distal tibialis posterior tendinopathy.

Anterior: Unremarkable

Achilles: Unremarkable

Plantar Fascia: Thickened proximal medial band of the plantar fascia
favoring plantar fasciitis. Plantar calcaneal spur noted.

LIGAMENTS

Lateral: Poor definition of the anterior talofibular ligament. There
is synovitis or an ossicle in the expected vicinity of the ATFL.
Anterolateral impingement not excluded.

Medial: Mild thickening of the tibiospring portion of the deltoid
ligament. The recess between the inferoplantar longitudinal and
medioplantar oblique portion of the spring ligament is prominent,
suggesting a potential tear of the medioplantar oblique portion.
Mild heterogeneity of the superomedial portion of the spring
ligament.

CARTILAGE

Ankle Joint: 0.9 by 0.7 cm focus of subcortical marrow edema along
the central talar dome on image [DATE] compatible with a
non-fragmented osteochondral lesion. Degenerative subcortical cystic
lesion along the articular side of the medial malleolus, image [DATE].

Subtalar Joints/Sinus Tarsi: Mild spurring along the posterior
subtalar joint

Bones: Low-level edema in the proximal metaphysis of the fifth
metatarsal probably secondary to adjacent arthropathy with the
cuboid on image [DATE].

Other: Mild subcutaneous edema posteromedially along the ankle.
IMPRESSION: 1. In the area of the patient's palpable abnormality and discomfort,
there is moderate peroneus longus tendinopathy. This is associated
with deviation of the tendon due to a prominent peroneal tubercle.
2. In addition, the anterior talofibular ligament appears to be
torn. There is synovitis or an ossicle in the expected vicinity of
the ATFL. Anterolateral impingement is not excluded.
3. Mild distal tibialis posterior tendinopathy, correlate clinically
in assessing for tibialis posterior dysfunction.
4. Degeneration of the superomedial portion of the spring ligament,
with an appearance suggesting possible tear of the medioplantar
oblique portion of the spring ligament. In addition, there is
thickening of the tibiospring portion of the deltoid ligament
potentially reflecting a mild sprain.
5. 9 by 7 mm non-fragmented osteochondral lesion of the central
talar dome.
6. Tiny degenerative subcortical cysts along the base of the fifth
metatarsal adjacent to the cuboid, with adjacent marrow edema which
is probably due to the underlying arthropathy rather than stress
reaction.
7. Abnormal thickening of the medial band of the plantar fascia
favoring plantar fasciitis.

## 2018-11-21 ENCOUNTER — Other Ambulatory Visit: Payer: Self-pay | Admitting: Adult Health

## 2018-11-21 DIAGNOSIS — I10 Essential (primary) hypertension: Secondary | ICD-10-CM

## 2018-11-21 DIAGNOSIS — Z760 Encounter for issue of repeat prescription: Secondary | ICD-10-CM

## 2018-11-21 MED FILL — LISINOPRIL 5 MG TABLET: 5 | 90 days supply | Qty: 90 | Fill #0

## 2018-11-21 MED FILL — CITALOPRAM HBR 20 MG TABLET: 20 | 90 days supply | Qty: 90 | Fill #0

## 2018-11-21 MED FILL — ATORVASTATIN 20 MG TABLET: 20 | 90 days supply | Qty: 90 | Fill #1

## 2018-11-21 NOTE — Telephone Encounter (Signed)
Sent to the pharmacy by e-scribe for 90 days.  Pt has upcoming cpx on 12/19/2018.

## 2018-12-17 ENCOUNTER — Telehealth: Payer: Self-pay | Admitting: Adult Health

## 2018-12-17 DIAGNOSIS — E1169 Type 2 diabetes mellitus with other specified complication: Secondary | ICD-10-CM

## 2018-12-17 MED ORDER — GLIPIZIDE 5 MG PO TABS: 5.0000 mg | ORAL_TABLET | Freq: Two times a day (BID) | ORAL | 0 refills | Status: DC

## 2018-12-17 MED ORDER — METFORMIN HCL 1000 MG PO TABS: ORAL_TABLET | ORAL | 0 refills | Status: DC

## 2018-12-17 MED FILL — metFORMIN HCL 1000 MG TABS: 1000 | 90 days supply | Qty: 180 | Fill #0

## 2018-12-17 MED FILL — glipiZIDE 5 MG TABS: 5 | 90 days supply | Qty: 180 | Fill #0

## 2018-12-17 NOTE — Telephone Encounter (Signed)
Sent to the pharmacy by e-scribe. 

## 2018-12-17 NOTE — Telephone Encounter (Signed)
Patient will need a refill on diabetic medications to make it until his physical appt in October.

## 2018-12-17 NOTE — Telephone Encounter (Signed)
That is fine 

## 2018-12-19 ENCOUNTER — Encounter: Payer: Medicare Other | Admitting: Adult Health

## 2018-12-25 ENCOUNTER — Encounter: Payer: Self-pay | Admitting: Vascular Surgery

## 2018-12-25 ENCOUNTER — Other Ambulatory Visit: Payer: Self-pay | Admitting: *Deleted

## 2018-12-25 ENCOUNTER — Ambulatory Visit (INDEPENDENT_AMBULATORY_CARE_PROVIDER_SITE_OTHER): Payer: Medicare Other | Admitting: Vascular Surgery

## 2018-12-25 ENCOUNTER — Other Ambulatory Visit: Payer: Self-pay

## 2018-12-25 VITALS — BP 162/88 | HR 58 | Temp 98.2°F | Resp 16 | Ht 74.4 in | Wt 256.8 lb

## 2018-12-25 DIAGNOSIS — I83812 Varicose veins of left lower extremities with pain: Secondary | ICD-10-CM

## 2018-12-25 NOTE — Progress Notes (Signed)
Patient name: Jonathan Calhoun MRN: [id number] DOB: 09/03/1950 Sex: male  REASON FOR CONSULT: Symptomatic varicose veins with pain left leg  HPI: Jonathan Calhoun is a 68 y.o. male, who was previously seen by my partner Dr. early February 2020.  At that time he was noted to have fairly large left leg varicose veins.  He develops fullness heaviness and aching in his legs as the day progresses.  He has gotten mild but minimal relief from lower extremity compression stockings which he has been compliant wearing since his office visit in February.  He is also concerned that he is going to have skin breakdown long-term from his varicose veins.  He has no prior history of DVT.  Other medical problems include diabetes, hyperlipidemia, hypertension all of which are currently stable.  Past Medical History:  Diagnosis Date  . Allergy   . Anxiety   . Depression   . Diabetes mellitus    ORAL MEDS-NO INSULIN  . History of alcoholism ([place])   . History of shingles   . Hyperlipidemia   . Hypertension   . Ileus, postoperative ([place])    Post-op Chole Surgery   Past Surgical History:  Procedure Laterality Date  . BONE EXOSTOSIS EXCISION  08/10/2011   Procedure: EXOSTOSIS EXCISION;  Surgeon: Marcheta Grammes, DPM;  Location: [facility];  Service: Orthopedics;  Laterality: Left;  Exostectomy of Calcaneous Left Foot  . CARDIAC CATHETERIZATION  2002  . CHOLECYSTECTOMY    . OSTECTOMY Right 04/03/2018   Procedure: OSTECTOMY RIGHT CALCANEUS;  Surgeon: Caprice Beaver, DPM;  Location: [facility];  Service: Podiatry;  Laterality: Right;  . RIGHT SHOULDER SURGERY FOR TEAR AND SPUR - YRS AGO    . SYNOVECTOMY  08/10/2011   Procedure: SYNOVECTOMY;  Surgeon: Marcheta Grammes, DPM;  Location: [facility];  Service: Orthopedics;  Laterality: Left;  Synovectomy of Peroneal Tendon Left Foot  . TENDON REPAIR  08/10/2011   Procedure: TENDON REPAIR;  Surgeon: Marcheta Grammes, DPM;  Location: [facility];  Service:  Orthopedics;  Laterality: Left;  Repair of Peroneal Tendon Left Foot  . TENDON REPAIR Right 04/03/2018   Procedure: EXPLORATION OF PERONEAL TENDON WITH POSSIBLE REPAIR OF LONGITUDINAL TEAR OF THE PERONEAL TENDON RIGHT FOOT;  Surgeon: Caprice Beaver, DPM;  Location: [facility];  Service: Podiatry;  Laterality: Right;  . TONSILLECTOMY    . TOTAL HIP ARTHROPLASTY  03/29/2011   Procedure: TOTAL HIP ARTHROPLASTY;  Surgeon: Dione Plover Aluisio;  Location: [facility];  Service: Orthopedics;  Laterality: Right;    Family History  Problem Relation Age of Onset  . Depression Mother   . Cancer Mother        colon  . Heart failure Mother   . Colon cancer Mother   . Coronary artery disease Father   . Hypertension Father     SOCIAL HISTORY: Social History   Socioeconomic History  . Marital status: Married    Spouse name: Not on file  . Number of children: Not on file  . Years of education: Not on file  . Highest education level: Not on file  Occupational History  . Not on file  Social Needs  . Financial resource strain: Not on file  . Food insecurity    Worry: Not on file    Inability: Not on file  . Transportation needs    Medical: Not on file    Non-medical: Not on file  Tobacco Use  . Smoking status: Current Every Day Smoker  Packs/day: 0.00    Types: Cigars  . Smokeless tobacco: Never Used  Substance and Sexual Activity  . Alcohol use: No    Comment: quit 3 yrs ago  . Drug use: No  . Sexual activity: Not on file  Lifestyle  . Physical activity    Days per week: Not on file    Minutes per session: Not on file  . Stress: Not on file  Relationships  . Social Herbalist on phone: Not on file    Gets together: Not on file    Attends religious service: Not on file    Active member of club or organization: Not on file    Attends meetings of clubs or organizations: Not on file    Relationship status: Not on file  . Intimate partner violence    Fear of current or ex  partner: Not on file    Emotionally abused: Not on file    Physically abused: Not on file    Forced sexual activity: Not on file  Other Topics Concern  . Not on file  Social History Narrative   Married, lives with spouse.   Plans for home after surgery.   Living will, healthcare POA.    No Known Allergies  Current Outpatient Medications  Medication Sig Dispense Refill  . atorvastatin (LIPITOR) 20 MG tablet TAKE 1 TABLET BY MOUTH DAILY. 90 tablet 3  . citalopram (CELEXA) 20 MG tablet TAKE 1 TABLET BY MOUTH DAILY. 90 tablet 0  . glipiZIDE (GLUCOTROL) 5 MG tablet Take 1 tablet (5 mg total) by mouth 2 (two) times daily before a meal. 180 tablet 0  . glucose blood test strip Use as instructed 100 each 3  . lisinopril (ZESTRIL) 5 MG tablet TAKE 1 TABLET BY MOUTH EVERY MORNING. 90 tablet 0  . metFORMIN (GLUCOPHAGE) 1000 MG tablet TAKE 1 TABLET BY MOUTH 2 TIMES DAILY WITH A MEAL. 180 tablet 0  . ONETOUCH DELICA LANCETS MISC by Does not apply route.       No current facility-administered medications for this visit.     ROS:   General:  No weight loss, Fever, chills  Cardiac: No recent episodes of chest pain/pressure, no shortness of breath at rest.  No shortness of breath with exertion.  Denies history of atrial fibrillation or irregular heartbeat  Pulmonary: No home oxygen, no productive cough, no hemoptysis,  No asthma or wheezing  Skin: No rashes   Physical Examination  Vitals:   12/25/18 1114  BP: (!) 162/88  Pulse: (!) 58  Resp: 16  Temp: 98.2 F (36.8 C)  TempSrc: Temporal  SpO2: 98%  Weight: 256 lb 12.8 oz (116.5 kg)  Height: 6' 2.4" (1.89 m)    Body mass index is 32.62 kg/m.  General:  Alert and oriented, no acute distress HEENT: Normal Neck: No JVD Cardiac: Regular Rate and Rhythm Skin: No rash, thinned out skin near the gaiter area left leg with some brawny staining, large varicosity down the entire anterior tibial region and in the posterior left thigh  vein diameter 7 mm           extremity Pulses:  2+ radial, brachial, femoral,posterior tibial pulse left leg Musculoskeletal: No deformity trace ankle and pedal as well as pretibial edema left leg  Neurologic: Upper and lower extremity motor 5/5 and symmetric  DATA:  I reviewed the patient's recent duplex exam strip which showed diffuse reflux in the left greater saphenous vein 5 to 7  mm vein diameter.  There was also some common femoral vein reflux.  I confirmed these findings with the SonoSite at the bedside today.  ASSESSMENT: Symptomatic varicose veins left leg with pain and swelling   PLAN: Discussed laser ablation of the left greater saphenous vein and stab avulsions with the patient today.  I discussed with him that we would do this in a staged approach.  We will consider stab avulsions only if the laser ablation does not completely decompress all of the other veins.  We will get him scheduled for this in the near future pending insurance approval.  Risk benefits possible complications and procedure details were discussed with patient today include not limited to bleeding infection deep vein thrombosis all of which were about 1%.  Ruta Hinds, MD [facility] [place] Office: [phone] Pager: [phone]

## 2018-12-31 IMAGING — CR DG FOOT COMPLETE 3+V*R*
1 series · 3 of 3 positions shown · non-contrast
Comparison: Radiographs 04/02/2018.  MRI 02/13/2018.

CLINICAL DATA: Post op lateral calcaneal osteotomy.

EXAM:
RIGHT FOOT COMPLETE - 3+ VIEW

[Series 1: ap · 0.17mm/px · 3 of 3 slices shown]
[im 1/3]
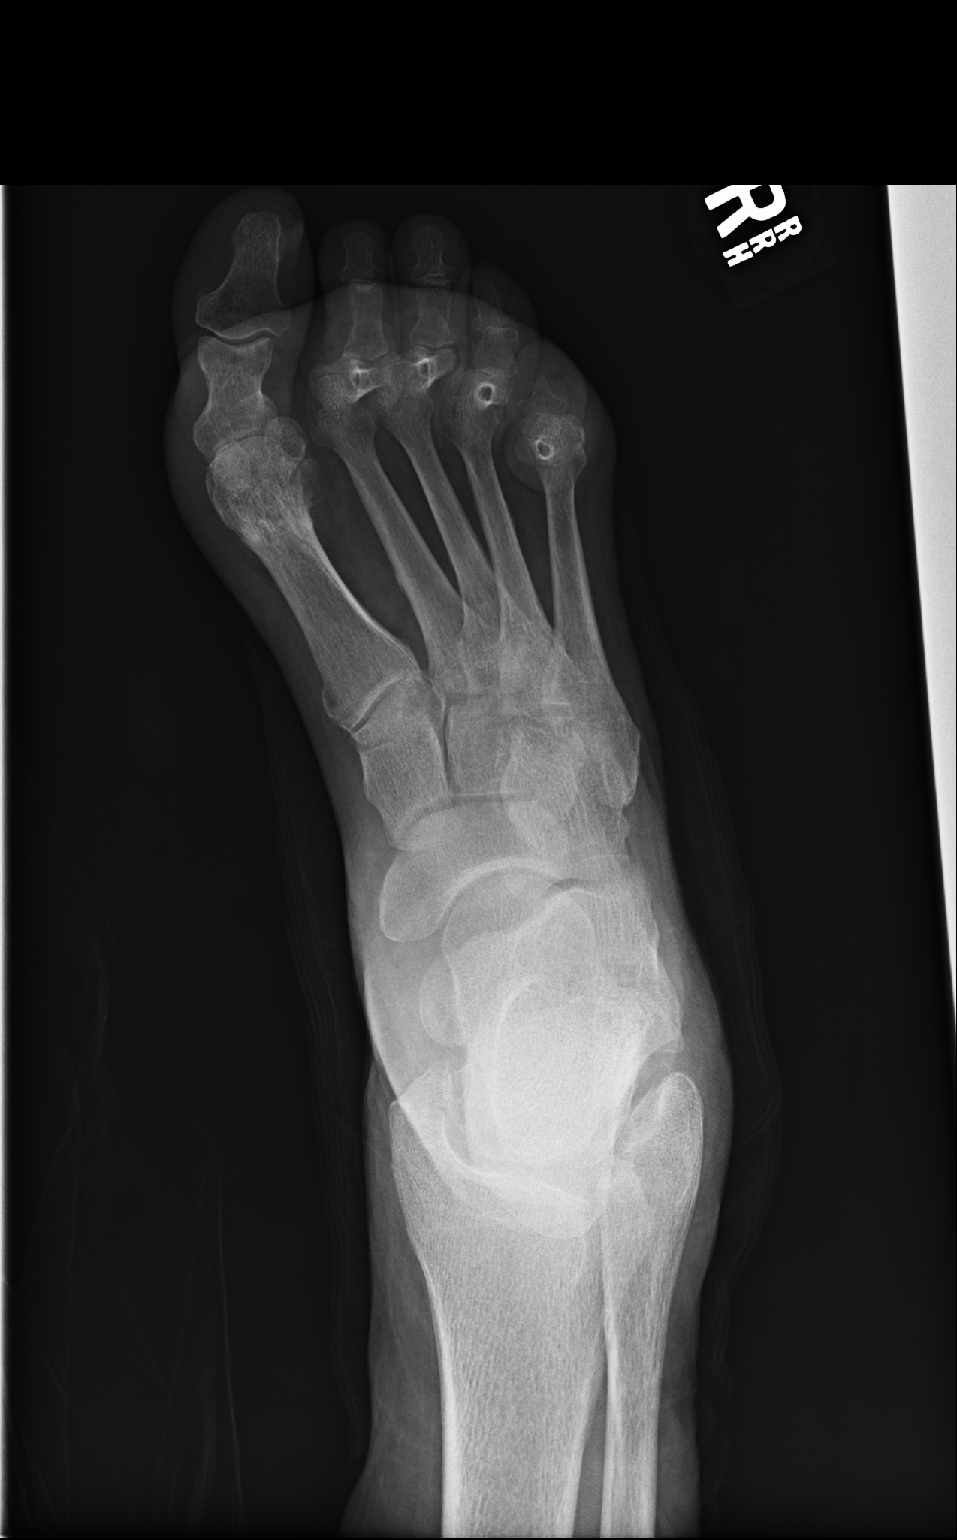
[im 2/3]
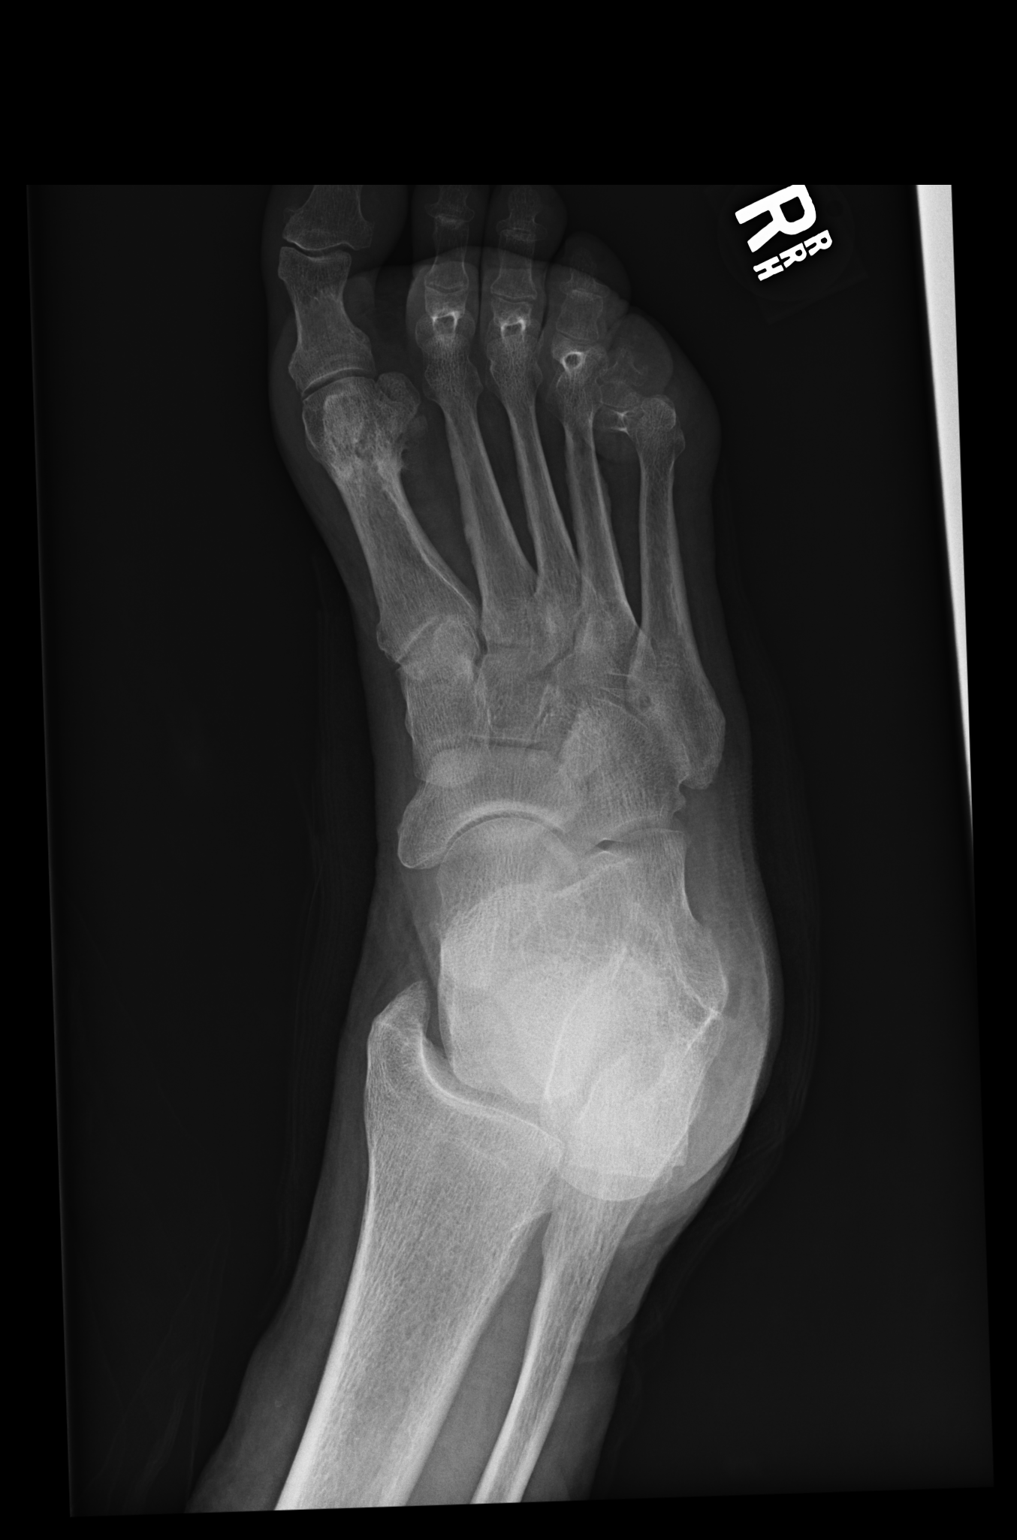
[im 3/3]
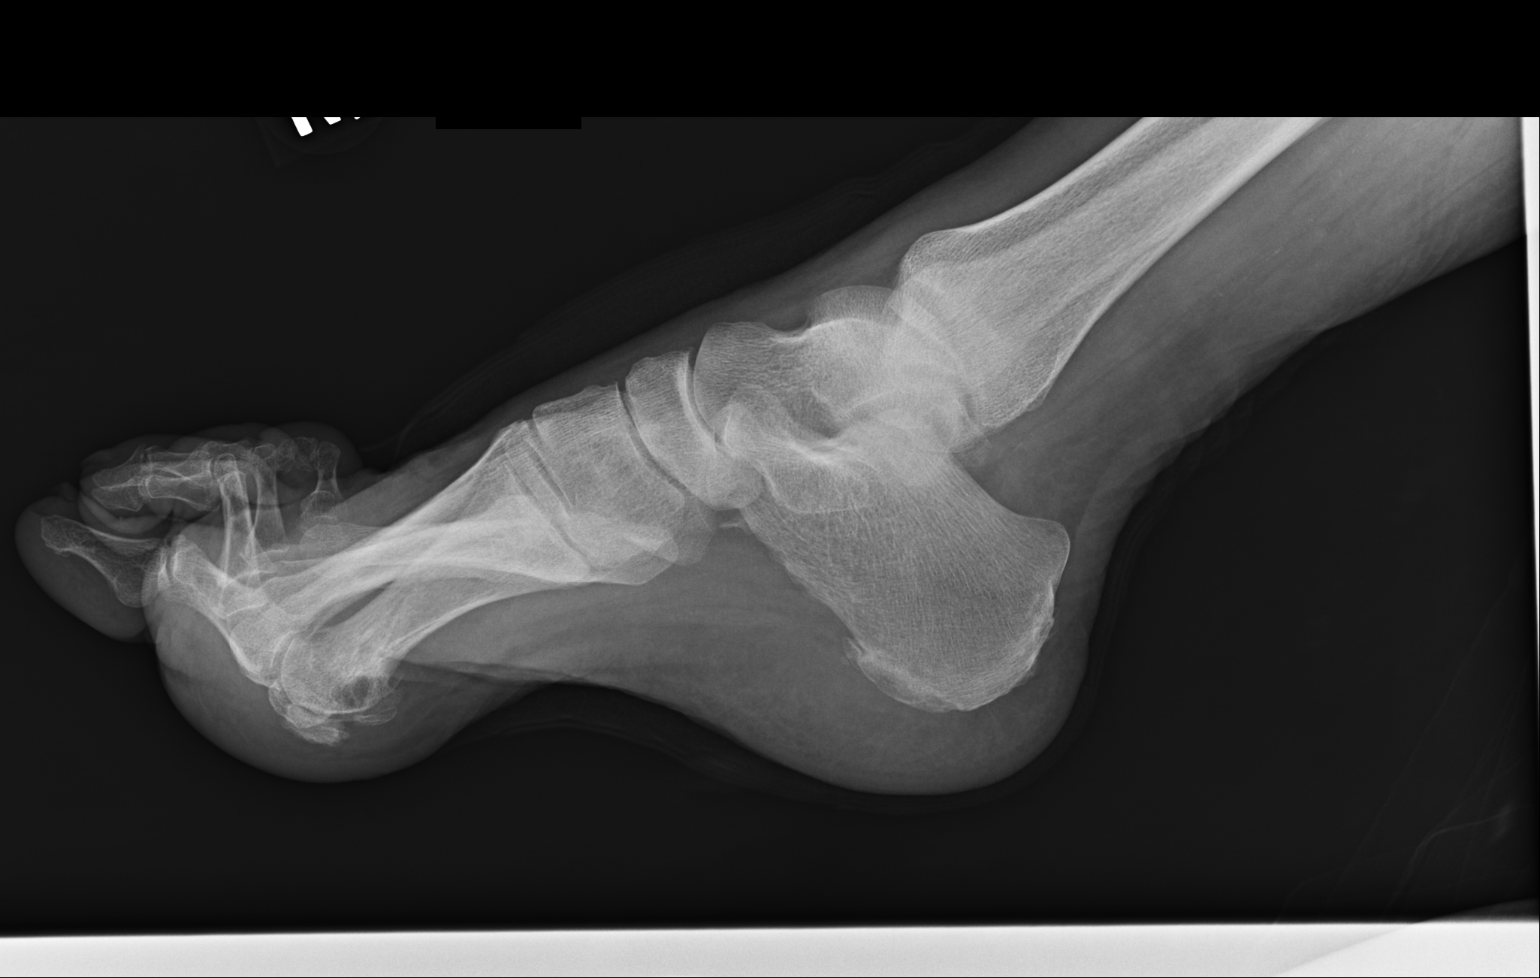

[3 of 3 positions shown; findings below may reference images not displayed]

FINDINGS: Bone detail is limited on these portable examinations. There are
postsurgical changes laterally in the hindfoot, presumably from
excision of the previously demonstrated prominent peroneal tubercle.
There is no evidence of acute fracture or dislocation. Moderate
degenerative changes are present at the 1st metatarsophalangeal
joint. There are hammertoe deformities of the 2nd through 5th
digits.
IMPRESSION: No demonstrated complication following calcaneal osteotomy.

## 2019-01-01 ENCOUNTER — Ambulatory Visit (INDEPENDENT_AMBULATORY_CARE_PROVIDER_SITE_OTHER): Payer: Medicare Other | Admitting: Vascular Surgery

## 2019-01-01 ENCOUNTER — Encounter: Payer: Self-pay | Admitting: Vascular Surgery

## 2019-01-01 ENCOUNTER — Other Ambulatory Visit: Payer: Self-pay

## 2019-01-01 VITALS — BP 135/75 | HR 65 | Temp 98.2°F | Resp 18 | Ht 74.0 in | Wt 250.0 lb

## 2019-01-01 DIAGNOSIS — I83812 Varicose veins of left lower extremities with pain: Secondary | ICD-10-CM

## 2019-01-01 HISTORY — PX: ENDOVENOUS ABLATION SAPHENOUS VEIN W/ LASER: SUR449

## 2019-01-01 NOTE — Progress Notes (Signed)
     Laser Ablation Procedure    Date: 01/01/2019   OLLIVANDER SEE DOB:Aug 16, 1950  Consent signed: Yes    Surgeon:  Dr. Ruta Hinds  Procedure: Laser Ablation: left Greater Saphenous Vein  BP 135/75 (BP Location: Left Arm, Patient Position: Sitting, Cuff Size: Large)   Pulse 65   Temp 98.2 F (36.8 C) (Temporal)   Resp 18   Ht 6\' 2"  (1.88 m)   Wt 250 lb (113.4 kg)   SpO2 98%   BMI 32.10 kg/m   Tumescent Anesthesia: 500 cc 0.9% NaCl with 50 cc Lidocaine HCL 1% and 15 cc 8.4% NaHCO3  Local Anesthesia: 6 cc Lidocaine HCL and NaHCO3 (ratio 2:1)  15 watts continuous mode        Total energy: 2242 Joules    Total time: 2:29 Laser fiber ref # [id number]  Lot # [id number]    Patient tolerated procedure well  Notes: Mr. Detjen wore facial mask. All staff members wore facial masks and facial shields. Discharge instructions reviewed with patient and hardcopy given to him to take home.  Description of Procedure:  After marking the course of the secondary varicosities, the patient was placed on the operating table in the supine position, and the left leg was prepped and draped in sterile fashion.   Local anesthetic was administered and under ultrasound guidance the saphenous vein was accessed with a micro needle and guide wire; then the mirco puncture sheath was placed.  A guide wire was inserted saphenofemoral junction , followed by a 5 french sheath.  The position of the sheath and then the laser fiber below the junction was confirmed using the ultrasound.  Tumescent anesthesia was administered along the course of the saphenous vein using ultrasound guidance. The patient was placed in Trendelenburg position and protective laser glasses were placed on patient and staff, and the laser was fired at 15 watts continuous mode advancing 1-30mm/second for a total of 2242 joules.    Steri strip was applied to the IV insertion site and ABD pads and thigh high compression stockings were applied.  Ace  wrap bandages were applied  at the top of the saphenofemoral junction. Blood loss was less than 15 cc.  The patient ambulated out of the operating room having tolerated the procedure well.  Ruta Hinds, MD [facility] Office: [phone] Pager: [phone]

## 2019-01-08 ENCOUNTER — Other Ambulatory Visit: Payer: Self-pay

## 2019-01-08 ENCOUNTER — Ambulatory Visit (HOSPITAL_COMMUNITY)
Admission: RE | Admit: 2019-01-08 | Discharge: 2019-01-08 | Disposition: A | Discharge status: Home / Self Care | Payer: Medicare Other | Source: Ambulatory Visit | Attending: Vascular Surgery | Admitting: Vascular Surgery

## 2019-01-08 ENCOUNTER — Encounter: Payer: Self-pay | Admitting: Vascular Surgery

## 2019-01-08 ENCOUNTER — Ambulatory Visit (INDEPENDENT_AMBULATORY_CARE_PROVIDER_SITE_OTHER): Payer: Self-pay | Admitting: Vascular Surgery

## 2019-01-08 VITALS — BP 142/80 | HR 74 | Temp 97.7°F | Resp 16 | Ht 74.0 in | Wt 250.0 lb

## 2019-01-08 DIAGNOSIS — I83812 Varicose veins of left lower extremities with pain: Secondary | ICD-10-CM | POA: Diagnosis not present

## 2019-01-08 NOTE — Progress Notes (Signed)
Patient is a 68 year old male who returns for postoperative follow-up after laser ablation of his left greater saphenous vein approximately 1 week ago.  He reports still some mild achiness and bruising but overall feels well.  He has no shortness of breath.  He has no chest pain.  Physical exam:  Vitals:   01/08/19 1248  BP: (!) 142/80  Pulse: 74  Resp: 16  Temp: 97.7 F (36.5 C)  TempSrc: Temporal  SpO2: 96%  Weight: 250 lb (113.4 kg)  Height: 6\' 2"  (1.88 m)   Left lower extremity mild ecchymosis mid postero-medial thigh  Data: Patient had a duplex ultrasound today which showed successful closure of the greater saphenous vein no evidence of DVT.  Assessment: Patient doing well status post laser ablation left greater saphenous vein.  Plan: Patient will follow-up in 3 months time for consideration for stab avulsions in the left leg.  Ruta Hinds, MD [facility] Office: [phone] Pager: [phone]

## 2019-02-06 ENCOUNTER — Encounter (HOSPITAL_COMMUNITY): Payer: Medicare Other

## 2019-02-17 ENCOUNTER — Other Ambulatory Visit: Payer: Self-pay | Admitting: Adult Health

## 2019-02-17 DIAGNOSIS — Z760 Encounter for issue of repeat prescription: Secondary | ICD-10-CM

## 2019-02-17 DIAGNOSIS — E1169 Type 2 diabetes mellitus with other specified complication: Secondary | ICD-10-CM

## 2019-02-17 DIAGNOSIS — I10 Essential (primary) hypertension: Secondary | ICD-10-CM

## 2019-02-18 NOTE — Telephone Encounter (Signed)
Glipizide and metformin filled for 3 months on 03/19/2019.  Request is too early.  All others filled for 90 days.  Pt has upcoming cpx.

## 2019-02-27 ENCOUNTER — Ambulatory Visit (INDEPENDENT_AMBULATORY_CARE_PROVIDER_SITE_OTHER): Payer: Medicare Other | Admitting: Adult Health

## 2019-02-27 ENCOUNTER — Encounter: Payer: Self-pay | Admitting: Adult Health

## 2019-02-27 VITALS — BP 130/72 | Temp 98.0°F | Ht 73.0 in | Wt 242.0 lb

## 2019-02-27 DIAGNOSIS — I10 Essential (primary) hypertension: Secondary | ICD-10-CM | POA: Diagnosis not present

## 2019-02-27 DIAGNOSIS — N401 Enlarged prostate with lower urinary tract symptoms: Secondary | ICD-10-CM | POA: Diagnosis not present

## 2019-02-27 DIAGNOSIS — E782 Mixed hyperlipidemia: Secondary | ICD-10-CM

## 2019-02-27 DIAGNOSIS — E1169 Type 2 diabetes mellitus with other specified complication: Secondary | ICD-10-CM | POA: Diagnosis not present

## 2019-02-27 DIAGNOSIS — Z0000 Encounter for general adult medical examination without abnormal findings: Secondary | ICD-10-CM

## 2019-02-27 DIAGNOSIS — Z23 Encounter for immunization: Secondary | ICD-10-CM

## 2019-02-27 DIAGNOSIS — R351 Nocturia: Secondary | ICD-10-CM | POA: Diagnosis not present

## 2019-02-27 LAB — CBC WITH DIFFERENTIAL/PLATELET
Basophils Absolute: 0.1 10*3/uL (ref 0.0–0.1)
Basophils Relative: 0.7 % (ref 0.0–3.0)
Eosinophils Absolute: 0.1 10*3/uL (ref 0.0–0.7)
Eosinophils Relative: 1.1 % (ref 0.0–5.0)
HCT: 43.7 % (ref 39.0–52.0)
Hemoglobin: 14.7 g/dL (ref 13.0–17.0)
Lymphocytes Relative: 14.4 % (ref 12.0–46.0)
Lymphs Abs: 1.4 10*3/uL (ref 0.7–4.0)
MCHC: 33.6 g/dL (ref 30.0–36.0)
MCV: 93.5 fl (ref 78.0–100.0)
Monocytes Absolute: 0.7 10*3/uL (ref 0.1–1.0)
Monocytes Relative: 7.4 % (ref 3.0–12.0)
Neutro Abs: 7.5 10*3/uL (ref 1.4–7.7)
Neutrophils Relative %: 76.4 % (ref 43.0–77.0)
Platelets: 214 10*3/uL (ref 150.0–400.0)
RBC: 4.67 Mil/uL (ref 4.22–5.81)
RDW: 14.9 % (ref 11.5–15.5)
WBC: 9.9 10*3/uL (ref 4.0–10.5)

## 2019-02-27 LAB — COMPREHENSIVE METABOLIC PANEL
ALT: 20 U/L (ref 0–53)
AST: 19 U/L (ref 0–37)
Albumin: 4.4 g/dL (ref 3.5–5.2)
Alkaline Phosphatase: 83 U/L (ref 39–117)
BUN: 11 mg/dL (ref 6–23)
CO2: 27 mEq/L (ref 19–32)
Calcium: 9.7 mg/dL (ref 8.4–10.5)
Chloride: 103 mEq/L (ref 96–112)
Creatinine, Ser: 0.75 mg/dL (ref 0.40–1.50)
GFR: 103.17 mL/min (ref >60.00)
Glucose, Bld: 129 mg/dL — ABNORMAL HIGH (ref 70–99)
Potassium: 4.7 mEq/L (ref 3.5–5.1)
Sodium: 138 mEq/L (ref 135–145)
Total Bilirubin: 1.1 mg/dL (ref 0.2–1.2)
Total Protein: 7.1 g/dL (ref 6.0–8.3)

## 2019-02-27 LAB — PSA: PSA: 0.24 ng/mL (ref 0.10–4.00)

## 2019-02-27 LAB — LIPID PANEL
Cholesterol: 128 mg/dL (ref 0–200)
HDL: 40.2 mg/dL (ref >39.00)
LDL Cholesterol: 69 mg/dL (ref 0–99)
NonHDL: 87.38
Total CHOL/HDL Ratio: 3
Triglycerides: 90 mg/dL (ref 0.0–149.0)
VLDL: 18 mg/dL (ref 0.0–40.0)

## 2019-02-27 LAB — HEMOGLOBIN A1C: Hgb A1c MFr Bld: 6.3 % (ref 4.6–6.5)

## 2019-02-27 LAB — TSH: TSH: 1.31 u[IU]/mL (ref 0.35–4.50)

## 2019-02-27 MED ORDER — ONETOUCH ULTRASOFT LANCETS MISC: 12 refills | Status: DC

## 2019-02-27 MED ORDER — ONETOUCH ULTRA 2 W/DEVICE KIT: 1.0000 | PACK | Freq: Once | Status: AC

## 2019-02-27 MED ORDER — GLUCOSE BLOOD VI STRP: ORAL_STRIP | 12 refills | Status: DC

## 2019-02-27 NOTE — Addendum Note (Signed)
Addended by: Miles Costain T on: 02/27/2019 12:01 PM   Modules accepted: Orders

## 2019-02-27 NOTE — Progress Notes (Signed)
Subjective:    Patient ID: Jonathan Calhoun, male    DOB: 1950-08-06, 68 y.o.   MRN: [id number]  HPI Patient presents for yearly preventative medicine examination. He is a pleasant 68 year old male who  has a past medical history of Allergy, Anxiety, Depression, Diabetes mellitus, History of alcoholism ([place]), History of shingles, Hyperlipidemia, Hypertension, and Ileus, postoperative ([place]).  DM -he is currently maintained on glipizide 5 mg twice a day and metformin 1000 mg twice a day.  He denies hypoglycemic events. He has been monitoring his blood sugars and reports readings between 112-125.  Lab Results  Component Value Date   HGBA1C 6.6 (H) 09/17/2018   Hyperlipidemia -takes Lipitor 20 mg daily.  Denies myalgia or fatigue Lab Results  Component Value Date   CHOL 117 10/09/2017   HDL 38.10 (L) 10/09/2017   LDLCALC 55 10/09/2017   TRIG 121.0 10/09/2017   CHOLHDL 3 10/09/2017   Hypertension - takes lisinopril 5 mg daily.  BP Readings from Last 3 Encounters:  02/27/19 130/72  01/08/19 (!) 142/80  01/01/19 135/75   Anxiety - well controlled with Celexa 20 mg   BPH - symptom free  All immunizations and health maintenance protocols were reviewed with the patient and needed orders were placed. Due for flu shot.   Appropriate screening laboratory values were ordered for the patient including screening of hyperlipidemia, renal function and hepatic function. If indicated by BPH, a PSA was ordered.  Medication reconciliation,  past medical history, social history, problem list and allergies were reviewed in detail with the patient  Goals were established with regard to weight loss, exercise, and  diet in compliance with medications. He is staying active with outdoor activities. He is trying to eat healthy.   Wt Readings from Last 3 Encounters:  02/27/19 242 lb (109.8 kg)  01/08/19 250 lb (113.4 kg)  01/01/19 250 lb (113.4 kg)    End of life planning was discussed.  He is  up-to-date on routine screening colonoscopy  He has no acute complaints.   Review of Systems  Constitutional: Negative.   HENT: Negative.   Eyes: Negative.   Respiratory: Negative.   Cardiovascular: Negative.   Gastrointestinal: Negative.   Endocrine: Negative.   Genitourinary: Negative.   Musculoskeletal: Negative.   Skin: Negative.   Allergic/Immunologic: Negative.   Neurological: Negative.   Hematological: Negative.   Psychiatric/Behavioral: Negative.   All other systems reviewed and are negative.  Past Medical History:  Diagnosis Date  . Allergy   . Anxiety   . Depression   . Diabetes mellitus    ORAL MEDS-NO INSULIN  . History of alcoholism ([place])   . History of shingles   . Hyperlipidemia   . Hypertension   . Ileus, postoperative ([place])    Post-op Chole Surgery    Social History   Socioeconomic History  . Marital status: Married    Spouse name: Not on file  . Number of children: Not on file  . Years of education: Not on file  . Highest education level: Not on file  Occupational History  . Not on file  Social Needs  . Financial resource strain: Not on file  . Food insecurity    Worry: Not on file    Inability: Not on file  . Transportation needs    Medical: Not on file    Non-medical: Not on file  Tobacco Use  . Smoking status: Current Every Day Smoker    Packs/day: 0.00  Types: Cigars  . Smokeless tobacco: Never Used  Substance and Sexual Activity  . Alcohol use: No    Comment: quit 3 yrs ago  . Drug use: No  . Sexual activity: Not on file  Lifestyle  . Physical activity    Days per week: Not on file    Minutes per session: Not on file  . Stress: Not on file  Relationships  . Social Herbalist on phone: Not on file    Gets together: Not on file    Attends religious service: Not on file    Active member of club or organization: Not on file    Attends meetings of clubs or organizations: Not on file    Relationship status: Not  on file  . Intimate partner violence    Fear of current or ex partner: Not on file    Emotionally abused: Not on file    Physically abused: Not on file    Forced sexual activity: Not on file  Other Topics Concern  . Not on file  Social History Narrative   Married, lives with spouse.   Plans for home after surgery.   Living will, healthcare POA.    Past Surgical History:  Procedure Laterality Date  . BONE EXOSTOSIS EXCISION  08/10/2011   Procedure: EXOSTOSIS EXCISION;  Surgeon: Marcheta Grammes, DPM;  Location: [facility];  Service: Orthopedics;  Laterality: Left;  Exostectomy of Calcaneous Left Foot  . CARDIAC CATHETERIZATION  2002  . CHOLECYSTECTOMY    . ENDOVENOUS ABLATION SAPHENOUS VEIN W/ LASER Left 01/01/2019   endovenous laser ablation left greater saphenous vein by Ruta Hinds MD   . OSTECTOMY Right 04/03/2018   Procedure: OSTECTOMY RIGHT CALCANEUS;  Surgeon: Caprice Beaver, DPM;  Location: [facility];  Service: Podiatry;  Laterality: Right;  . RIGHT SHOULDER SURGERY FOR TEAR AND SPUR - YRS AGO    . SYNOVECTOMY  08/10/2011   Procedure: SYNOVECTOMY;  Surgeon: Marcheta Grammes, DPM;  Location: [facility];  Service: Orthopedics;  Laterality: Left;  Synovectomy of Peroneal Tendon Left Foot  . TENDON REPAIR  08/10/2011   Procedure: TENDON REPAIR;  Surgeon: Marcheta Grammes, DPM;  Location: [facility];  Service: Orthopedics;  Laterality: Left;  Repair of Peroneal Tendon Left Foot  . TENDON REPAIR Right 04/03/2018   Procedure: EXPLORATION OF PERONEAL TENDON WITH POSSIBLE REPAIR OF LONGITUDINAL TEAR OF THE PERONEAL TENDON RIGHT FOOT;  Surgeon: Caprice Beaver, DPM;  Location: [facility];  Service: Podiatry;  Laterality: Right;  . TONSILLECTOMY    . TOTAL HIP ARTHROPLASTY  03/29/2011   Procedure: TOTAL HIP ARTHROPLASTY;  Surgeon: Dione Plover Aluisio;  Location: [facility];  Service: Orthopedics;  Laterality: Right;    Family History  Problem Relation Age of Onset  . Depression Mother    . Cancer Mother        colon  . Heart failure Mother   . Colon cancer Mother   . Coronary artery disease Father   . Hypertension Father     No Known Allergies  Current Outpatient Medications on File Prior to Visit  Medication Sig Dispense Refill  . atorvastatin (LIPITOR) 20 MG tablet TAKE 1 TABLET BY MOUTH DAILY. 90 tablet 0  . citalopram (CELEXA) 20 MG tablet TAKE 1 TABLET BY MOUTH DAILY. 90 tablet 0  . glipiZIDE (GLUCOTROL) 5 MG tablet Take 1 tablet (5 mg total) by mouth 2 (two) times daily before a meal. 180 tablet 0  . glucose blood test  strip Use as instructed 100 each 3  . lisinopril (ZESTRIL) 5 MG tablet TAKE 1 TABLET BY MOUTH EVERY MORNING. 90 tablet 0  . metFORMIN (GLUCOPHAGE) 1000 MG tablet TAKE 1 TABLET BY MOUTH 2 TIMES DAILY WITH A MEAL. 180 tablet 0  . ONETOUCH DELICA LANCETS MISC by Does not apply route.       No current facility-administered medications on file prior to visit.     BP 130/72   Temp 98 F (36.7 C)   Ht _0  (1.854 m)   Wt 242 lb (109.8 kg) Comment: PATIENT REPORTED  BMI 31.93 kg/m       Objective:   Physical Exam Vitals signs and nursing note reviewed.  Constitutional:      Appearance: Normal appearance. He is obese.  HENT:     Head: Normocephalic and atraumatic.     Right Ear: Tympanic membrane, ear canal and external ear normal. There is no impacted cerumen.     Left Ear: Tympanic membrane, ear canal and external ear normal. There is no impacted cerumen.     Nose: Nose normal. No congestion or rhinorrhea.     Mouth/Throat:     Mouth: Mucous membranes are moist.     Dentition: Abnormal dentition.     Pharynx: Oropharynx is clear. No oropharyngeal exudate or posterior oropharyngeal erythema.  Eyes:     Extraocular Movements: Extraocular movements intact.     Pupils: Pupils are equal, round, and reactive to light.  Cardiovascular:     Rate and Rhythm: Normal rate and regular rhythm.     Pulses: Normal pulses.     Heart sounds:  Normal heart sounds. No murmur. No friction rub. No gallop.   Pulmonary:     Effort: Pulmonary effort is normal. No respiratory distress.     Breath sounds: Normal breath sounds. No stridor. No wheezing, rhonchi or rales.  Chest:     Chest wall: No tenderness.  Abdominal:     General: Abdomen is flat. Bowel sounds are normal. There is no distension.     Palpations: Abdomen is soft. There is no mass.     Tenderness: There is no abdominal tenderness. There is no right CVA tenderness, left CVA tenderness, guarding or rebound.     Hernia: No hernia is present.  Musculoskeletal: Normal range of motion.        General: No swelling, tenderness, deformity or signs of injury.     Right lower leg: No edema.     Left lower leg: No edema.  Skin:    General: Skin is warm and dry.     Capillary Refill: Capillary refill takes less than 2 seconds.     Coloration: Skin is not jaundiced or pale.     Findings: No bruising, erythema, lesion or rash.  Neurological:     General: No focal deficit present.     Mental Status: He is alert and oriented to person, place, and time.     Cranial Nerves: No cranial nerve deficit.     Sensory: No sensory deficit.     Motor: No weakness.     Coordination: Coordination normal.     Gait: Gait normal.     Deep Tendon Reflexes: Reflexes normal.  Psychiatric:        Mood and Affect: Mood normal.        Behavior: Behavior normal.        Thought Content: Thought content normal.        Judgment: Judgment  normal.       Assessment & Plan:  .1. Routine general medical examination at a health care facility - Encouraged heart healthy diet and exercise - Follow up in one year or sooner if needed - CBC with Differential/Platelet - Comprehensive metabolic panel - Hemoglobin A1c - Lipid panel - TSH  2. Type 2 diabetes mellitus with other specified complication, unspecified whether long term insulin use (HCC) - Follow up in 3 months or sooner if needed - Needs to start  exercising and losing weight  - CBC with Differential/Platelet - Comprehensive metabolic panel - Hemoglobin A1c - Lipid panel - Blood Glucose Monitoring Suppl (ONE TOUCH ULTRA 2) w/Device KIT; 1 kit by Does not apply route once for 1 dose.  Dispense: 1 kit; Refill: 0- - glucose blood test strip; Use as instructed  Dispense: 100 each; Refill: 12 - Lancets (ONETOUCH ULTRASOFT) lancets; Use as instructed  Dispense: 100 each; Refill: 12  3. Essential hypertension - No change in medications  - CBC with Differential/Platelet - Comprehensive metabolic panel - Hemoglobin A1c - Lipid panel  4. Mixed hyperlipidemia - Continue with statin  - CBC with Differential/Platelet - Comprehensive metabolic panel - Hemoglobin A1c - Lipid panel - TSH  5. BPH associated with nocturia  - PSA  [name], NP

## 2019-03-07 ENCOUNTER — Other Ambulatory Visit: Payer: Self-pay

## 2019-03-07 ENCOUNTER — Telehealth (INDEPENDENT_AMBULATORY_CARE_PROVIDER_SITE_OTHER): Payer: Medicare Other | Admitting: Adult Health

## 2019-03-07 DIAGNOSIS — J069 Acute upper respiratory infection, unspecified: Secondary | ICD-10-CM

## 2019-03-07 MED ORDER — DOXYCYCLINE HYCLATE 100 MG PO CAPS: 100.0000 mg | ORAL_CAPSULE | Freq: Two times a day (BID) | ORAL | 0 refills | Status: DC

## 2019-03-07 MED ORDER — BENZONATATE 200 MG PO CAPS: 200.0000 mg | ORAL_CAPSULE | Freq: Two times a day (BID) | ORAL | 0 refills | Status: DC | PRN

## 2019-03-07 NOTE — Progress Notes (Signed)
Virtual Visit via Telephone Note  I connected with Jonathan Calhoun on 03/07/19 at  3:00 PM EDT by telephone and verified that I am speaking with the correct person using two identifiers.   I discussed the limitations, risks, security and privacy concerns of performing an evaluation and management service by telephone and the availability of in person appointments. I also discussed with the patient that there may be a patient responsible charge related to this service. The patient expressed understanding and agreed to proceed.  Location patient: home Location provider: work or home office Participants present for the call: patient, provider Patient did not have a visit in the prior 7 days to address this/these issue(s).   History of Present Illness: 68 year old male who is being evaluated today for possible URI.  His symptoms started on 12/27/2018 with mild body aches.  Since then he has developed a semiproductive cough with greenish-yellow sputum, generalized fatigue, generalized body aches, sinus pain and pressure, and headaches.  He denies shortness of breath, fevers, chills, loss of appetite, loss of taste or smell.  No one in his home is sick with the same symptoms   Observations/Objective: Patient sounds cheerful and well on the phone. I do not appreciate any SOB. Speech and thought processing are grossly intact. Patient reported vitals:  Assessment and Plan: 1. Upper respiratory tract infection, unspecified type -Likely sinusitis but will test for coronavirus.  Sent in doxycycline as well as [facility].  We will follow-up early next week - Novel Coronavirus, NAA ([facility]) - benzonatate (TESSALON) 200 MG capsule; Take 1 capsule (200 mg total) by mouth 2 (two) times daily as needed for cough.  Dispense: 20 capsule; Refill: 0 - doxycycline (VIBRAMYCIN) 100 MG capsule; Take 1 capsule (100 mg total) by mouth 2 (two) times daily.  Dispense: 14 capsule; Refill: 0  [name],  NP   Follow Up Instructions:  I did not refer this patient for an OV in the next 24 hours for this/these issue(s).  I discussed the assessment and treatment plan with the patient. The patient was provided an opportunity to ask questions and all were answered. The patient agreed with the plan and demonstrated an understanding of the instructions.   The patient was advised to call back or seek an in-person evaluation if the symptoms worsen or if the condition fails to improve as anticipated.  I provided 18  minutes of non-face-to-face time during this encounter.   [name], NP

## 2019-03-10 ENCOUNTER — Other Ambulatory Visit: Payer: Self-pay

## 2019-03-10 DIAGNOSIS — Z20822 Contact with and (suspected) exposure to covid-19: Secondary | ICD-10-CM

## 2019-03-11 ENCOUNTER — Other Ambulatory Visit: Payer: Self-pay | Admitting: Adult Health

## 2019-03-11 ENCOUNTER — Other Ambulatory Visit: Payer: Self-pay

## 2019-03-11 ENCOUNTER — Ambulatory Visit (INDEPENDENT_AMBULATORY_CARE_PROVIDER_SITE_OTHER): Payer: Medicare Other | Admitting: Adult Health

## 2019-03-11 ENCOUNTER — Encounter: Payer: Self-pay | Admitting: Adult Health

## 2019-03-11 VITALS — BP 140/90 | HR 67 | Temp 98.1°F

## 2019-03-11 DIAGNOSIS — D492 Neoplasm of unspecified behavior of bone, soft tissue, and skin: Secondary | ICD-10-CM

## 2019-03-11 DIAGNOSIS — D225 Melanocytic nevi of trunk: Secondary | ICD-10-CM | POA: Diagnosis not present

## 2019-03-11 LAB — NOVEL CORONAVIRUS, NAA: SARS-CoV-2, NAA: NOT DETECTED

## 2019-03-11 NOTE — Progress Notes (Signed)
Subjective:    Patient ID: Jonathan Calhoun, male    DOB: 1950-07-26, 68 y.o.   MRN: [id number]  HPI 68 year old male who  has a past medical history of Allergy, Anxiety, Depression, Diabetes mellitus, History of alcoholism ([place]), History of shingles, Hyperlipidemia, Hypertension, and Ileus, postoperative ([place]).  He presents to the office today for removal of nevi from right abdomen. He has not noticed any change in the size or shape but his wife noticed that it was irregular in shape   Review of Systems See HPI   Past Medical History:  Diagnosis Date  . Allergy   . Anxiety   . Depression   . Diabetes mellitus    ORAL MEDS-NO INSULIN  . History of alcoholism ([place])   . History of shingles   . Hyperlipidemia   . Hypertension   . Ileus, postoperative ([place])    Post-op Chole Surgery    Social History   Socioeconomic History  . Marital status: Married    Spouse name: Not on file  . Number of children: Not on file  . Years of education: Not on file  . Highest education level: Not on file  Occupational History  . Not on file  Social Needs  . Financial resource strain: Not on file  . Food insecurity    Worry: Not on file    Inability: Not on file  . Transportation needs    Medical: Not on file    Non-medical: Not on file  Tobacco Use  . Smoking status: Current Every Day Smoker    Packs/day: 0.00    Types: Cigars  . Smokeless tobacco: Never Used  Substance and Sexual Activity  . Alcohol use: No    Comment: quit 3 yrs ago  . Drug use: No  . Sexual activity: Not on file  Lifestyle  . Physical activity    Days per week: Not on file    Minutes per session: Not on file  . Stress: Not on file  Relationships  . Social Herbalist on phone: Not on file    Gets together: Not on file    Attends religious service: Not on file    Active member of club or organization: Not on file    Attends meetings of clubs or organizations: Not on file    Relationship status:  Not on file  . Intimate partner violence    Fear of current or ex partner: Not on file    Emotionally abused: Not on file    Physically abused: Not on file    Forced sexual activity: Not on file  Other Topics Concern  . Not on file  Social History Narrative   Married, lives with spouse.   Plans for home after surgery.   Living will, healthcare POA.    Past Surgical History:  Procedure Laterality Date  . BONE EXOSTOSIS EXCISION  08/10/2011   Procedure: EXOSTOSIS EXCISION;  Surgeon: Marcheta Grammes, DPM;  Location: [facility];  Service: Orthopedics;  Laterality: Left;  Exostectomy of Calcaneous Left Foot  . CARDIAC CATHETERIZATION  2002  . CHOLECYSTECTOMY    . ENDOVENOUS ABLATION SAPHENOUS VEIN W/ LASER Left 01/01/2019   endovenous laser ablation left greater saphenous vein by Ruta Hinds MD   . OSTECTOMY Right 04/03/2018   Procedure: OSTECTOMY RIGHT CALCANEUS;  Surgeon: Caprice Beaver, DPM;  Location: [facility];  Service: Podiatry;  Laterality: Right;  . RIGHT SHOULDER SURGERY FOR TEAR AND SPUR -  YRS AGO    . SYNOVECTOMY  08/10/2011   Procedure: SYNOVECTOMY;  Surgeon: Marcheta Grammes, DPM;  Location: [facility];  Service: Orthopedics;  Laterality: Left;  Synovectomy of Peroneal Tendon Left Foot  . TENDON REPAIR  08/10/2011   Procedure: TENDON REPAIR;  Surgeon: Marcheta Grammes, DPM;  Location: [facility];  Service: Orthopedics;  Laterality: Left;  Repair of Peroneal Tendon Left Foot  . TENDON REPAIR Right 04/03/2018   Procedure: EXPLORATION OF PERONEAL TENDON WITH POSSIBLE REPAIR OF LONGITUDINAL TEAR OF THE PERONEAL TENDON RIGHT FOOT;  Surgeon: Caprice Beaver, DPM;  Location: [facility];  Service: Podiatry;  Laterality: Right;  . TONSILLECTOMY    . TOTAL HIP ARTHROPLASTY  03/29/2011   Procedure: TOTAL HIP ARTHROPLASTY;  Surgeon: Dione Plover Aluisio;  Location: [facility];  Service: Orthopedics;  Laterality: Right;    Family History  Problem Relation Age of Onset  . Depression  Mother   . Cancer Mother        colon  . Heart failure Mother   . Colon cancer Mother   . Coronary artery disease Father   . Hypertension Father     No Known Allergies  Current Outpatient Medications on File Prior to Visit  Medication Sig Dispense Refill  . atorvastatin (LIPITOR) 20 MG tablet TAKE 1 TABLET BY MOUTH DAILY. 90 tablet 0  . benzonatate (TESSALON) 200 MG capsule Take 1 capsule (200 mg total) by mouth 2 (two) times daily as needed for cough. 20 capsule 0  . citalopram (CELEXA) 20 MG tablet TAKE 1 TABLET BY MOUTH DAILY. 90 tablet 0  . doxycycline (VIBRAMYCIN) 100 MG capsule Take 1 capsule (100 mg total) by mouth 2 (two) times daily. 14 capsule 0  . glipiZIDE (GLUCOTROL) 5 MG tablet Take 1 tablet (5 mg total) by mouth 2 (two) times daily before a meal. 180 tablet 0  . glucose blood test strip Use as instructed 100 each 3  . glucose blood test strip Use as instructed 100 each 12  . Lancets (ONETOUCH ULTRASOFT) lancets Use as instructed 100 each 12  . lisinopril (ZESTRIL) 5 MG tablet TAKE 1 TABLET BY MOUTH EVERY MORNING. 90 tablet 0  . metFORMIN (GLUCOPHAGE) 1000 MG tablet TAKE 1 TABLET BY MOUTH 2 TIMES DAILY WITH A MEAL. 180 tablet 0   No current facility-administered medications on file prior to visit.     BP 140/90 (BP Location: Left Arm, Patient Position: Sitting, Cuff Size: Large)   Pulse 67   Temp 98.1 F (36.7 C) (Temporal)   SpO2 96%       Objective:   Physical Exam Vitals signs and nursing note reviewed.  Constitutional:      Appearance: Normal appearance.  Skin:    General: Skin is warm and dry.     Comments: 3 mm nevi irregular shaped noted on right upper abdomen   Neurological:     Mental Status: He is alert.       Assessment & Plan:  1. Skin neoplasm Procedure including risks/benefits explained to patient.  Questions were answered. After informed consent was obtained and a time out completed, site was cleansed with betadine and then alcohol. 1%  Lidocaine with epinephrine was injected under lesion and then shave biopsy was performed. Area was cauterized to obtain hemostasis.  Pt tolerated procedure well.  Specimen sent for pathology review.  Pt instructed to keep the area dry for 24 hours and to contact us if he develops redness, drainage or swelling at the  site.  Pt may use tylenol as needed for discomfort today.   - Dermatology pathology   [name], NP

## 2019-04-07 ENCOUNTER — Other Ambulatory Visit: Payer: Self-pay | Admitting: Adult Health

## 2019-04-07 DIAGNOSIS — E1169 Type 2 diabetes mellitus with other specified complication: Secondary | ICD-10-CM

## 2019-04-09 MED FILL — metFORMIN HCL 1000 MG TABS: 1000 | 90 days supply | Qty: 180 | Fill #0

## 2019-04-09 NOTE — Telephone Encounter (Signed)
Sent to the pharmacy by e-scribe. 

## 2019-04-09 NOTE — Telephone Encounter (Signed)
It was changed to 6 months after I reviewed his A1c

## 2019-04-16 ENCOUNTER — Ambulatory Visit: Payer: Medicare Other | Admitting: Vascular Surgery

## 2019-04-21 ENCOUNTER — Other Ambulatory Visit: Payer: Self-pay | Admitting: Adult Health

## 2019-04-21 DIAGNOSIS — E1169 Type 2 diabetes mellitus with other specified complication: Secondary | ICD-10-CM

## 2019-04-22 MED FILL — glipiZIDE 5 MG TABS: 5 | 90 days supply | Qty: 180 | Fill #0

## 2019-04-24 ENCOUNTER — Ambulatory Visit: Payer: Medicare Other | Admitting: Vascular Surgery

## 2019-04-30 ENCOUNTER — Ambulatory Visit (INDEPENDENT_AMBULATORY_CARE_PROVIDER_SITE_OTHER): Payer: Medicare Other | Admitting: Vascular Surgery

## 2019-04-30 ENCOUNTER — Other Ambulatory Visit: Payer: Self-pay

## 2019-04-30 ENCOUNTER — Encounter: Payer: Self-pay | Admitting: Vascular Surgery

## 2019-04-30 VITALS — BP 131/76 | HR 59 | Temp 97.4°F | Resp 18 | Ht 74.0 in | Wt 253.0 lb

## 2019-04-30 DIAGNOSIS — I83812 Varicose veins of left lower extremities with pain: Secondary | ICD-10-CM

## 2019-04-30 NOTE — Progress Notes (Signed)
Patient is a 68 year old male status post laser ablation of his left greater saphenous vein approximately 3 months ago.  He states the aching in his left leg has now resolved.  His preoperative symptoms have also resolved.  He does not really complain of pain aching or swelling in the left leg at this point.  He has completely returned to normal activities.   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  Assessment: Improved left leg symptoms after laser ablation left greater saphenous vein.  I discussed with the patient today the possibility of stab avulsions of the large varicose vein on the anterior surface of his left leg.  He states that he is not really bothered by this currently.  Plan: The patient will continue to wear his long-leg compression stockings for symptomatic relief and prevention of skin breakdown.  He will follow-up with [place] on an as-needed basis.  Ruta Hinds, MD [facility] Office: [phone]

## 2019-05-20 ENCOUNTER — Encounter: Payer: Self-pay | Admitting: Vascular Surgery

## 2019-05-20 ENCOUNTER — Other Ambulatory Visit: Payer: Self-pay | Admitting: Adult Health

## 2019-05-20 DIAGNOSIS — Z760 Encounter for issue of repeat prescription: Secondary | ICD-10-CM

## 2019-05-20 DIAGNOSIS — I10 Essential (primary) hypertension: Secondary | ICD-10-CM

## 2019-05-21 MED FILL — LISINOPRIL 5 MG TABS: 5 | 90 days supply | Qty: 90 | Fill #0

## 2019-05-21 MED FILL — ATORVASTATIN 20 MG TABLET: 20 | 90 days supply | Qty: 90 | Fill #0

## 2019-05-21 NOTE — Telephone Encounter (Signed)
Sent to the pharmacy by e-scribe. 

## 2019-06-18 ENCOUNTER — Other Ambulatory Visit: Payer: Self-pay | Admitting: Adult Health

## 2019-06-18 DIAGNOSIS — Z760 Encounter for issue of repeat prescription: Secondary | ICD-10-CM

## 2019-06-18 MED FILL — CITALOPRAM HBR 20 MG TABLET: 20 | 90 days supply | Qty: 90 | Fill #0

## 2019-07-06 ENCOUNTER — Ambulatory Visit: Payer: Medicare Other | Attending: Internal Medicine

## 2019-07-06 DIAGNOSIS — Z23 Encounter for immunization: Secondary | ICD-10-CM | POA: Insufficient documentation

## 2019-07-06 NOTE — Progress Notes (Signed)
   Covid-19 Vaccination Clinic  Name:  Jonathan Calhoun    MRN: [id number] DOB: [birth year]  07/06/2019  Jonathan Calhoun was observed post Covid-19 immunization for 15 minutes without incidence. He was provided with Vaccine Information Sheet and instruction to access the V-Safe system.   Jonathan Calhoun was instructed to call 911 with any severe reactions post vaccine: [name] Kitchen Difficulty breathing  . Swelling of your face and throat  . A fast heartbeat  . A bad rash all over your body  . Dizziness and weakness    Immunizations Administered    Name Date Dose VIS Date Route   Pfizer COVID-19 Vaccine 07/06/2019 10:11 AM 0.3 mL 05/02/2019 Intramuscular   Manufacturer: [place]   Lot: [id number]   [place]: [id number]

## 2019-07-25 MED FILL — glipiZIDE 5 MG TABS: 5 | 90 days supply | Qty: 180 | Fill #1

## 2019-07-25 MED FILL — metFORMIN HCL 1000 MG TABS: 1000 | 90 days supply | Qty: 180 | Fill #1

## 2019-07-29 ENCOUNTER — Ambulatory Visit: Payer: Medicare Other | Attending: Internal Medicine

## 2019-07-29 DIAGNOSIS — Z23 Encounter for immunization: Secondary | ICD-10-CM | POA: Insufficient documentation

## 2019-07-29 NOTE — Progress Notes (Signed)
   Covid-19 Vaccination Clinic  Name:  LEVIN PULLING    MRN: [id number] DOB: 05-08-1951  07/29/2019  Mr. Proia was observed post Covid-19 immunization for 15 minutes without incident. He was provided with Vaccine Information Sheet and instruction to access the V-Safe system.   Mr. Rigoni was instructed to call 911 with any severe reactions post vaccine: [name] Kitchen Difficulty breathing  . Swelling of face and throat  . A fast heartbeat  . A bad rash all over body  . Dizziness and weakness   Immunizations Administered    Name Date Dose VIS Date Route   Pfizer COVID-19 Vaccine 07/29/2019 11:20 AM 0.3 mL 05/02/2019 Intramuscular   Manufacturer: [place]   Lot: [id number]   [place]: [id number]

## 2019-08-28 ENCOUNTER — Ambulatory Visit: Payer: Medicare Other | Admitting: Adult Health

## 2019-09-02 ENCOUNTER — Other Ambulatory Visit: Payer: Self-pay | Admitting: Adult Health

## 2019-09-02 DIAGNOSIS — Z760 Encounter for issue of repeat prescription: Secondary | ICD-10-CM

## 2019-09-02 MED FILL — CITALOPRAM HBR 20 MG TABLET: 20 | 90 days supply | Qty: 90 | Fill #0

## 2019-09-02 MED FILL — LISINOPRIL 5 MG TABS: 5 | 90 days supply | Qty: 90 | Fill #1

## 2019-09-02 MED FILL — ATORVASTATIN 20 MG TABLET: 20 | 90 days supply | Qty: 90 | Fill #1

## 2019-09-02 NOTE — Telephone Encounter (Signed)
Sent to the pharmacy by e-scribe. 

## 2019-09-03 DIAGNOSIS — D225 Melanocytic nevi of trunk: Secondary | ICD-10-CM | POA: Diagnosis not present

## 2019-09-03 DIAGNOSIS — D485 Neoplasm of uncertain behavior of skin: Secondary | ICD-10-CM | POA: Diagnosis not present

## 2019-09-03 DIAGNOSIS — L821 Other seborrheic keratosis: Secondary | ICD-10-CM | POA: Diagnosis not present

## 2019-09-03 DIAGNOSIS — L708 Other acne: Secondary | ICD-10-CM | POA: Diagnosis not present

## 2019-10-06 ENCOUNTER — Other Ambulatory Visit: Payer: Self-pay

## 2019-10-07 ENCOUNTER — Encounter: Payer: Self-pay | Admitting: Adult Health

## 2019-10-07 ENCOUNTER — Ambulatory Visit (INDEPENDENT_AMBULATORY_CARE_PROVIDER_SITE_OTHER): Payer: Medicare Other | Admitting: Adult Health

## 2019-10-07 VITALS — BP 120/72 | Temp 97.8°F | Wt 240.0 lb

## 2019-10-07 DIAGNOSIS — E1169 Type 2 diabetes mellitus with other specified complication: Secondary | ICD-10-CM

## 2019-10-07 LAB — POCT GLYCOSYLATED HEMOGLOBIN (HGB A1C): HbA1c, POC (controlled diabetic range): 5.9 % (ref 0.0–7.0)

## 2019-10-07 MED ORDER — METFORMIN HCL 1000 MG PO TABS: ORAL_TABLET | ORAL | 1 refills | Status: DC

## 2019-10-07 MED ORDER — GLIPIZIDE 5 MG PO TABS: 5.0000 mg | ORAL_TABLET | Freq: Two times a day (BID) | ORAL | 1 refills | Status: DC

## 2019-10-07 MED FILL — metFORMIN HCL 1000 MG TABS: 1000 | 90 days supply | Qty: 180 | Fill #0

## 2019-10-07 MED FILL — glipiZIDE 5 MG TABS: 5 | 90 days supply | Qty: 180 | Fill #0

## 2019-10-07 NOTE — Progress Notes (Signed)
Subjective:    Patient ID: Jonathan Calhoun, male    DOB: July 15, 1950, 69 y.o.   MRN: [id number]  HPI 69 year old male who  has a past medical history of Allergy, Anxiety, Depression, Diabetes mellitus, History of alcoholism ([place]), History of shingles, Hyperlipidemia, Hypertension, and Ileus, postoperative ([place]).  He presents to the office today for 4-month follow-up regarding diabetes mellitus.  In the past he has had very well controlled blood sugars.  He is currently maintained on glipizide 5 mg twice daily and Metformin 1000 mg twice daily.  He denies hypoglycemic events. He has not been monitoring his blood sugars at home. He is staying active and eating healthy  He has no acute complaints today   Lab Results  Component Value Date   HGBA1C 6.3 02/27/2019    Review of Systems See HPI   Past Medical History:  Diagnosis Date  . Allergy   . Anxiety   . Depression   . Diabetes mellitus    ORAL MEDS-NO INSULIN  . History of alcoholism ([place])   . History of shingles   . Hyperlipidemia   . Hypertension   . Ileus, postoperative ([place])    Post-op Chole Surgery    Social History   Socioeconomic History  . Marital status: Married    Spouse name: Not on file  . Number of children: Not on file  . Years of education: Not on file  . Highest education level: Not on file  Occupational History  . Not on file  Tobacco Use  . Smoking status: Current Every Day Smoker    Packs/day: 0.00    Types: Cigars  . Smokeless tobacco: Never Used  Substance and Sexual Activity  . Alcohol use: No    Comment: quit 3 yrs ago  . Drug use: No  . Sexual activity: Not on file  Other Topics Concern  . Not on file  Social History Narrative   Married, lives with spouse.   Plans for home after surgery.   Living will, healthcare POA.   Social Determinants of Health   Financial Resource Strain:   . Difficulty of Paying Living Expenses:   Food Insecurity:   . Worried About Charity fundraiser in  the Last Year:   . Arboriculturist in the Last Year:   Transportation Needs:   . Film/video editor (Medical):   [name] Kitchen Lack of Transportation (Non-Medical):   Physical Activity:   . Days of Exercise per Week:   . Minutes of Exercise per Session:   Stress:   . Feeling of Stress :   Social Connections:   . Frequency of Communication with Friends and Family:   . Frequency of Social Gatherings with Friends and Family:   . Attends Religious Services:   . Active Member of Clubs or Organizations:   . Attends Archivist Meetings:   [name] Kitchen Marital Status:   Intimate Partner Violence:   . Fear of Current or Ex-Partner:   . Emotionally Abused:   [name] Kitchen Physically Abused:   . Sexually Abused:     Past Surgical History:  Procedure Laterality Date  . BONE EXOSTOSIS EXCISION  08/10/2011   Procedure: EXOSTOSIS EXCISION;  Surgeon: Marcheta Grammes, DPM;  Location: [facility];  Service: Orthopedics;  Laterality: Left;  Exostectomy of Calcaneous Left Foot  . CARDIAC CATHETERIZATION  2002  . CHOLECYSTECTOMY    . ENDOVENOUS ABLATION SAPHENOUS VEIN W/ LASER Left 01/01/2019   endovenous laser ablation left  greater saphenous vein by Ruta Hinds MD   . OSTECTOMY Right 04/03/2018   Procedure: OSTECTOMY RIGHT CALCANEUS;  Surgeon: Caprice Beaver, DPM;  Location: [facility];  Service: Podiatry;  Laterality: Right;  . RIGHT SHOULDER SURGERY FOR TEAR AND SPUR - YRS AGO    . SYNOVECTOMY  08/10/2011   Procedure: SYNOVECTOMY;  Surgeon: Marcheta Grammes, DPM;  Location: [facility];  Service: Orthopedics;  Laterality: Left;  Synovectomy of Peroneal Tendon Left Foot  . TENDON REPAIR  08/10/2011   Procedure: TENDON REPAIR;  Surgeon: Marcheta Grammes, DPM;  Location: [facility];  Service: Orthopedics;  Laterality: Left;  Repair of Peroneal Tendon Left Foot  . TENDON REPAIR Right 04/03/2018   Procedure: EXPLORATION OF PERONEAL TENDON WITH POSSIBLE REPAIR OF LONGITUDINAL TEAR OF THE PERONEAL TENDON RIGHT FOOT;   Surgeon: Caprice Beaver, DPM;  Location: [facility];  Service: Podiatry;  Laterality: Right;  . TONSILLECTOMY    . TOTAL HIP ARTHROPLASTY  03/29/2011   Procedure: TOTAL HIP ARTHROPLASTY;  Surgeon: Dione Plover Aluisio;  Location: [facility];  Service: Orthopedics;  Laterality: Right;    Family History  Problem Relation Age of Onset  . Depression Mother   . Cancer Mother        colon  . Heart failure Mother   . Colon cancer Mother   . Coronary artery disease Father   . Hypertension Father     No Known Allergies  Current Outpatient Medications on File Prior to Visit  Medication Sig Dispense Refill  . atorvastatin (LIPITOR) 20 MG tablet TAKE 1 TABLET BY MOUTH DAILY. 90 tablet 3  . citalopram (CELEXA) 20 MG tablet TAKE 1 TABLET BY MOUTH DAILY. 90 tablet 1  . glucose blood test strip Use as instructed 100 each 3  . glucose blood test strip Use as instructed 100 each 12  . Lancets (ONETOUCH ULTRASOFT) lancets Use as instructed 100 each 12  . lisinopril (ZESTRIL) 5 MG tablet TAKE 1 TABLET BY MOUTH EVERY MORNING. 90 tablet 3  . metFORMIN (GLUCOPHAGE) 1000 MG tablet TAKE 1 TABLET BY MOUTH 2 TIMES DAILY WITH A MEAL. 180 tablet 1  . glipiZIDE (GLUCOTROL) 5 MG tablet TAKE 1 TABLET (5 MG TOTAL) BY MOUTH 2 (TWO) TIMES DAILY BEFORE A MEAL. 180 tablet 1   No current facility-administered medications on file prior to visit.    BP 120/72   Temp 97.8 F (36.6 C)   Wt 240 lb (108.9 kg)   BMI 30.81 kg/m       Objective:   Physical Exam Vitals and nursing note reviewed.  Constitutional:      Appearance: Normal appearance.  Cardiovascular:     Rate and Rhythm: Normal rate and regular rhythm.     Pulses: Normal pulses.     Heart sounds: Normal heart sounds.  Pulmonary:     Effort: Pulmonary effort is normal.     Breath sounds: Normal breath sounds.  Musculoskeletal:        General: Normal range of motion.  Skin:    General: Skin is warm.  Neurological:     General: No focal deficit present.       Mental Status: He is alert.       Assessment & Plan:  1. Type 2 diabetes mellitus with other specified complication, unspecified whether long term insulin use (HCC)  - POCT A1C- 5.9 - at goal. No change in medications  - Will see him back in October for his CPE  - metFORMIN (GLUCOPHAGE)  1000 MG tablet; Take one tablet BID  Dispense: 180 tablet; Refill: 1 - glipiZIDE (GLUCOTROL) 5 MG tablet; Take 1 tablet (5 mg total) by mouth 2 (two) times daily before a meal.  Dispense: 180 tablet; Refill: 1   [name], NP

## 2019-10-07 NOTE — Patient Instructions (Addendum)
Follow up in October for your physical   Your A1c is 5.9 - great job!   Please let me know if you need anything

## 2019-10-08 ENCOUNTER — Telehealth: Payer: Self-pay | Admitting: Adult Health

## 2019-10-08 NOTE — Progress Notes (Signed)
  Chronic Care Management   Outreach Note  10/08/2019 Name: Jonathan Calhoun MRN: [id number] DOB: 19-Oct-1950  Referred by: [name], NP Reason for referral : No chief complaint on file.   An unsuccessful telephone outreach was attempted today. The patient was referred to the pharmacist for assistance with care management and care coordination.   Follow Up Plan:   Prathima Ghanta [employer] Scheduler

## 2019-10-27 ENCOUNTER — Telehealth: Payer: Self-pay | Admitting: Adult Health

## 2019-10-27 NOTE — Progress Notes (Signed)
  Chronic Care Management   Note  10/27/2019 Name: Jonathan Calhoun MRN: [id number] DOB: 07-14-1950  Jonathan Calhoun is a 69 y.o. year old male who is a primary care patient of [name], NP. I reached out to Jonathan Calhoun by phone today in response to a referral sent by Mr. Shloime Keilman Doughtie's PCP, [name], NP.   [name] was given information about Chronic Care Management services today including:  1. CCM service includes personalized support from designated clinical staff supervised by his physician, including individualized plan of care and coordination with other care providers 2. 24/7 contact phone numbers for assistance for urgent and routine care needs. 3. Service will only be billed when office clinical staff spend 20 minutes or more in a month to coordinate care. 4. Only one practitioner may furnish and bill the service in a calendar month. 5. The patient may stop CCM services at any time (effective at the end of the month) by phone call to the office staff.   Patient agreed to services and verbal consent obtained.   Follow up plan:  [place]

## 2019-11-06 MED FILL — metFORMIN HCL 1000 MG TABS: 1000 | 90 days supply | Qty: 180 | Fill #0

## 2019-11-06 MED FILL — glipiZIDE 5 MG TABS: 5 | 90 days supply | Qty: 180 | Fill #0

## 2019-12-03 MED FILL — LISINOPRIL 5 MG TABS: 5 | 90 days supply | Qty: 90 | Fill #2

## 2019-12-03 MED FILL — CITALOPRAM HBR 20 MG TABLET: 20 | 90 days supply | Qty: 90 | Fill #1

## 2019-12-03 MED FILL — ATORVASTATIN 20 MG TABLET: 20 | 90 days supply | Qty: 90 | Fill #2

## 2019-12-10 ENCOUNTER — Telehealth: Payer: Self-pay | Admitting: Adult Health

## 2019-12-10 NOTE — Telephone Encounter (Signed)
Left message for patient to schedule Annual Wellness Visit.  Please schedule with Nurse Health Advisor Shannon Crews, RN at [facility]  

## 2019-12-23 ENCOUNTER — Other Ambulatory Visit: Payer: Self-pay | Admitting: Adult Health

## 2019-12-23 DIAGNOSIS — E1169 Type 2 diabetes mellitus with other specified complication: Secondary | ICD-10-CM

## 2019-12-23 DIAGNOSIS — I10 Essential (primary) hypertension: Secondary | ICD-10-CM

## 2019-12-23 DIAGNOSIS — E782 Mixed hyperlipidemia: Secondary | ICD-10-CM

## 2019-12-24 ENCOUNTER — Ambulatory Visit: Payer: PRIVATE HEALTH INSURANCE

## 2019-12-24 ENCOUNTER — Telehealth: Payer: Self-pay | Admitting: Adult Health

## 2019-12-24 NOTE — Progress Notes (Signed)
  Chronic Care Management   Outreach Note  12/24/2019 Name: Jonathan Calhoun MRN: [id number] DOB: 10-03-1950  Referred by: [name], NP Reason for referral : No chief complaint on file.   An unsuccessful telephone outreach was attempted today. The patient was referred to the pharmacist for assistance with care management and care coordination.   Follow Up Plan:   Carley Perdue [employer] Scheduler

## 2019-12-29 ENCOUNTER — Telehealth: Payer: Self-pay | Admitting: Adult Health

## 2019-12-29 NOTE — Telephone Encounter (Signed)
Pt forgot his appt with Pharmacist on 8/4 and ask for a call to r/s

## 2019-12-29 NOTE — Progress Notes (Signed)
  Chronic Care Management   Note  12/29/2019 Name: JAMEY HARMAN MRN: [id number] DOB: January 29, 1951  [name] is a 69 y.o. year old male who is a primary care patient of [name], NP. I reached out to [name] by phone today in response to a referral sent by Mr. Justen Fonda Rasool's PCP, [name], NP.   Mr. Kitson was given information about Chronic Care Management services today including:  1. CCM service includes personalized support from designated clinical staff supervised by his physician, including individualized plan of care and coordination with other care providers 2. 24/7 contact phone numbers for assistance for urgent and routine care needs. 3. Service will only be billed when office clinical staff spend 20 minutes or more in a month to coordinate care. 4. Only one practitioner may furnish and bill the service in a calendar month. 5. The patient may stop CCM services at any time (effective at the end of the month) by phone call to the office staff.   Patient agreed to services and verbal consent obtained.   Follow up plan:   Carley Perdue [employer] Scheduler

## 2020-02-10 ENCOUNTER — Telehealth: Payer: Self-pay

## 2020-02-10 ENCOUNTER — Telehealth: Payer: Self-pay | Admitting: *Deleted

## 2020-02-10 DIAGNOSIS — M5137 Other intervertebral disc degeneration, lumbosacral region: Secondary | ICD-10-CM

## 2020-02-10 DIAGNOSIS — E782 Mixed hyperlipidemia: Secondary | ICD-10-CM

## 2020-02-10 DIAGNOSIS — E1169 Type 2 diabetes mellitus with other specified complication: Secondary | ICD-10-CM

## 2020-02-10 DIAGNOSIS — F528 Other sexual dysfunction not due to a substance or known physiological condition: Secondary | ICD-10-CM

## 2020-02-10 DIAGNOSIS — I10 Essential (primary) hypertension: Secondary | ICD-10-CM

## 2020-02-10 NOTE — Telephone Encounter (Signed)
-----   Message from Germaine Pomfret, [facility] sent at 02/09/2020  9:12 AM EDT ----- Regarding: CCM Referral Apolonio Schneiders,  Can you please place a referral to chronic care management for this patient?  Thanks, Doristine Section Clinical Pharmacist [facility]

## 2020-02-10 NOTE — Progress Notes (Signed)
    Chronic Care Management Pharmacy Assistant   Name: Jonathan Calhoun  MRN: [id number] DOB: May 18, 1951  Reason for Encounter: Medication Review/initial question for in   PCP : [name], NP  Allergies:  No Known Allergies  Medications: Outpatient Encounter Medications as of 02/10/2020  Medication Sig  . atorvastatin (LIPITOR) 20 MG tablet TAKE 1 TABLET BY MOUTH DAILY.  . citalopram (CELEXA) 20 MG tablet TAKE 1 TABLET BY MOUTH DAILY.  [name] Kitchen glipiZIDE (GLUCOTROL) 5 MG tablet Take 1 tablet (5 mg total) by mouth 2 (two) times daily before a meal.  . glucose blood test strip Use as instructed  . glucose blood test strip Use as instructed  . Lancets (ONETOUCH ULTRASOFT) lancets Use as instructed  . lisinopril (ZESTRIL) 5 MG tablet TAKE 1 TABLET BY MOUTH EVERY MORNING.  . metFORMIN (GLUCOPHAGE) 1000 MG tablet Take one tablet BID   No facility-administered encounter medications on file as of 02/10/2020.    Current Diagnosis: Patient Active Problem List   Diagnosis Date Noted  . Diabetes ([place]) 09/28/2015  . Other cyst of bone, right lower leg 02/24/2013  . Osteoarthritis of right hip 03/30/2011  . AUTONOMIC NEUROPATHY, DIABETIC 04/29/2010  . [place] DISEASE, LUMBAR 05/04/2007  . Hyperlipidemia 02/13/2007  . ERECTILE DYSFUNCTION 02/13/2007  . Essential hypertension 02/13/2007  . ALLERGIC RHINITIS 02/13/2007    Follow-Up:  Pharmacist Review   Have you seen any other providers since your last visit?no Any changes in your medications or health? no Any side effects from any medications? no Do you have an symptoms or problems not managed by your medications? no Any concerns about your health right now? Yes  Patient has concerns about his weight. Has your provider asked that you check blood pressure, blood sugar, or follow special diet at home? No  Patient states he does not check his blood pressure at home.  Patient states his diet consist of grill,baked and sometimes fried  food.Patient states he does eat out twice a week . Patient states he  does drink  soda , coffee and water. Do you get any type of exercise on a regular basis? no Can you think of a goal you would like to reach for your health?   Patient states he would like to lose 20 pounds. Do you have any problems getting your medications? no Is there anything that you would like to discuss during the appointment?   Patient would like to discuss his weight.  Please bring medications and supplements to appointment    [place] Pharmacist Assistant [phone]

## 2020-02-10 NOTE — Chronic Care Management (AMB) (Signed)
Chronic Care Management Pharmacy  Name: Jonathan Calhoun  MRN: [id number] DOB: 1950-12-17   Chief Complaint/ HPI  Jonathan Calhoun,  69 y.o. , male presents for their Initial CCM visit with the clinical pharmacist In office.  PCP : [name], NP Patient Care Team: [name], NP as PCP - General (Family Medicine) Ortho, [facility] (Orthopedic Surgery) Marlyce Huge, DDS as Consulting Physician (Dentistry) Germaine Pomfret, [facility] as Pharmacist (Pharmacist)  Their chronic conditions include: Hypertension, Hyperlipidemia, Diabetes, Osteoarthritis and Allergic Rhinitis   Office Visits: 10/07/19: Patient presented to [name], NP for follow-up. A1c improved to 5.9%. Benzonatate and doxycyline stopped.   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   No Known Allergies  Medications: Outpatient Encounter Medications as of 02/11/2020  Medication Sig  . atorvastatin (LIPITOR) 20 MG tablet TAKE 1 TABLET BY MOUTH DAILY.  . citalopram (CELEXA) 20 MG tablet TAKE 1 TABLET BY MOUTH DAILY.  [name] Kitchen glipiZIDE (GLUCOTROL) 5 MG tablet Take 1 tablet (5 mg total) by mouth 2 (two) times daily before a meal.  . glucose blood test strip Use as instructed  . glucose blood test strip Use as instructed  . Lancets (ONETOUCH ULTRASOFT) lancets Use as instructed  . lisinopril (ZESTRIL) 5 MG tablet TAKE 1 TABLET BY MOUTH EVERY MORNING.  . metFORMIN (GLUCOPHAGE) 1000 MG tablet Take one tablet BID   No facility-administered encounter medications on file as of 02/11/2020.    Wt Readings from Last 3 Encounters:  10/07/19 240 lb (108.9 kg)  04/30/19 253 lb (114.8 kg)  02/27/19 242 lb (109.8 kg)    Current Diagnosis/Assessment:  SDOH Interventions     Most Recent Value  SDOH Interventions  Financial Strain Interventions Intervention Not Indicated  Transportation Interventions Intervention Not Indicated      Goals Addressed            This Visit's Progress    . Chronic Care Management       CARE PLAN ENTRY (see longitudinal plan of care for additional care plan information)  Current Barriers:  . Chronic Disease Management support, education, and care coordination needs related to Hypertension, Hyperlipidemia, Diabetes, Osteoarthritis and Allergic Rhinitis    Hypertension BP Readings from Last 3 Encounters:  10/07/19 120/72  04/30/19 131/76  03/11/19 140/90   . Pharmacist Clinical Goal(s): o Over the next 90 days, patient will work with PharmD and providers to maintain BP goal <130/80 . Current regimen:  o Lisinopril 5 mg daily . Interventions: o Discussed low salt diet and exercising as tolerated extensively o Will initiate blood pressure monitoring plan  . Patient self care activities - Over the next 90 days, patient will: o Check Blood Pressure 1-2 times weekly, document, and provide at future appointments o Ensure daily salt intake < 2300 mg/day  Hyperlipidemia Lab Results  Component Value Date/Time   LDLCALC 69 02/27/2019 07:43 AM   . Pharmacist Clinical Goal(s): o Over the next 90 days, patient will work with PharmD and providers to maintain LDL goal < 70 . Current regimen:  o Atorvastatin 20 mg daily . Interventions: o Discussed low cholesterol diet and exercising as tolerated extensively o Will initiate cholesterol monitoring plan   Diabetes Lab Results  Component Value Date/Time   HGBA1C 5.9 10/07/2019 07:11 AM   HGBA1C 6.3 02/27/2019 07:43 AM   HGBA1C 6.6 (H) 09/17/2018 10:51 AM   HGBA1C 5.9 05/30/2018 09:29 AM   . Pharmacist Clinical Goal(s): o Over the next  90 days, patient will work with PharmD and providers to maintain A1c goal <7% . Current regimen:  [name] Kitchen Glipizide 5 mg twice daily  . Metformin 1000 mg twice daily  . Interventions: o Recommend Stopping Glipizide o Recommend starting Ozempic 0.25 mg into the skin weekly o Continue Metformin o Discussed carbohydrate counting and exercising as tolerated  extensively o Will initiate blood sugar monitoring plan  . Patient self care activities - Over the next 90 days, patient will: o Check blood sugar as needed, document, and provide at future appointments o Contact provider with any episodes of hypoglycemia  Medication management . Pharmacist Clinical Goal(s): o Over the next 90 days, patient will work with PharmD and providers to maintain optimal medication adherence . Current pharmacy: [facility] . Interventions o Comprehensive medication review performed. o Utilize [facility] for medication synchronization, packaging and delivery o Verbal consent obtained for [facility] enhanced pharmacy services (medication synchronization, adherence packaging, delivery coordination). A medication sync plan was created to allow patient to get all medications delivered once every 30 to 90 days per patient preference. Patient understands they have freedom to choose pharmacy and clinical pharmacist will coordinate care between all prescribers and [facility]. . Patient self care activities - Over the next 90 days, patient will: o Take medications as prescribed o Report any questions or concerns to PharmD and/or provider(s)      Diabetes   A1c goal <7%  Recent Relevant Labs: Lab Results  Component Value Date/Time   HGBA1C 5.9 10/07/2019 07:11 AM   HGBA1C 6.3 02/27/2019 07:43 AM   HGBA1C 6.6 (H) 09/17/2018 10:51 AM   HGBA1C 5.9 05/30/2018 09:29 AM   GFR 103.17 02/27/2019 07:43 AM   GFR 95.30 10/09/2017 08:01 AM   MICROALBUR 0.7 01/25/2016 08:57 AM   MICROALBUR 1.7 09/14/2015 08:42 AM    Last diabetic Eye exam:  Lab Results  Component Value Date/Time   HMDIABEYEEXA No Retinopathy 05/23/2018 12:00 AM    Last diabetic Foot exam: No results found for: HMDIABFOOTEX   Checking BG: Never  Recent FBG Readings: n/a Recent pre-meal BG readings: n/a Recent 2hr PP BG readings:  n/a Recent HS BG readings:  n/a  Patient has failed these meds in past: n/a Patient is currently controlled on the following medications: [name] Kitchen Glipizide 5 mg twice daily  . Metformin 1000 mg twice daily   We discussed: diet and exercise extensively. Patient states he wants to try and lose 20 pounds of weight. His biggest challenge is portion control. He states he has somewhat of an understanding of what to do in regards to diet, but struggles with following through. He wants to know if there is anything he take take that will help him.   Patient maintains mainly inactive lifestyle.   We discussed the potential of GLP-1 agonists to give him a starting boost towards his weight loss goal. We discussed the MOA, administration, and common side effects.   Plan  Recommend stopping Glipizide and starting Ozempic 0.25 mg weekly to promote weight loss and reduce risk of hypoglycemia.   Hypertension   BP goal is:  <130/80  Office blood pressures are  BP Readings from Last 3 Encounters:  10/07/19 120/72  04/30/19 131/76  03/11/19 140/90   Patient checks BP at home several times per month Patient home BP readings are ranging: n/a  Patient has failed these meds in the past: n/a Patient is currently controlled on the following medications:  . Lisinopril 5 mg  daily   We discussed diet and exercise extensively  Plan  Continue current medications   Hyperlipidemia   LDL goal < 70  Lipid Panel     Component Value Date/Time   CHOL 128 02/27/2019 0743   TRIG 90.0 02/27/2019 0743   HDL 40.20 02/27/2019 0743   LDLCALC 69 02/27/2019 0743    Hepatic Function Latest Ref Rng & Units 02/27/2019 10/09/2017 01/25/2016  Total Protein 6.0 - 8.3 g/dL 7.1 7.1 7.0  Albumin 3.5 - 5.2 g/dL 4.4 4.4 4.3  AST 0 - 37 U/L $Remo'19 18 20  'TORpz$ ALT 0 - 53 U/L $Remo'20 21 27  'oTKdf$ Alk Phosphatase 39 - 117 U/L 83 90 91  Total Bilirubin 0.2 - 1.2 mg/dL 1.1 1.2 0.8  Bilirubin, Direct 0.0 - 0.3 mg/dL - 0.3 0.2     The ASCVD Risk score (Fruita., et al.,  08-12-11) failed to calculate for the following reasons:   The valid total cholesterol range is 130 to 320 mg/dL   Patient has failed these meds in past: n/a Patient is currently controlled on the following medications:  . Atorvastatin 20 mg daily   We discussed:  diet and exercise extensively  Plan  Continue current medications  Depression / Anxiety   PHQ9 Score:  PHQ9 SCORE ONLY 03/11/2019 11/15/2016 09/28/2015  PHQ-9 Total Score 0 0 0   GAD7 Score: No flowsheet data found.  Patient has failed these meds in past: n/a Patient is currently controlled on the following medications:  . Citalopram 20 mg daily (started 08-11-1997)   We discussed:  Started on Citalopram Aug 11, 1997 following death of his son. Since then, patient's mood has been mostly stable. He has multiple strategies to help him manage his stress. He has never tried to stop citalopram before.   Plan  Recommend decreasing citalopram to 10 mg daily to assess if patient may be a candidate for tapering.   Osteopenia / Osteoporosis   Last DEXA Scan: n/a   T-Score femoral neck: n/a  T-Score total hip: n/a  T-Score lumbar spine: n/a  T-Score forearm radius: n/a  10-year probability of major osteoporotic fracture: n/a  10-year probability of hip fracture: n/a  No results found for: VD25OH   Patient is not a candidate for pharmacologic treatment  Patient has failed these meds in past: n/a Patient is currently uncontrolled on the following medications:  . None  We discussed:  Recommend [phone] units of vitamin D daily. Patient asking if he would benefit from taking a vitamin D supplement for his bones. Patient likely does not receive adequate vitamin D intake from sunlight.   Plan  Recommend checking vitamin D to ensure not deficient  Recommend starting Vitamin D 2000 units daily    Medication Management   Pt uses [facility] for all medications Uses pill box? No - Doesn't feel that he needs.  We  discussed: Discussed benefits of medication synchronization, packaging and delivery as well as enhanced pharmacist oversight with [facility].  Plan  Utilize [facility] for medication synchronization, packaging and delivery   Verbal consent obtained for [facility] enhanced pharmacy services (medication synchronization, adherence packaging, delivery coordination). A medication sync plan was created to allow patient to get all medications delivered once every 30 to 90 days per patient preference. Patient understands they have freedom to choose pharmacy and clinical pharmacist will coordinate care between all prescribers and [facility].  Follow up: 4 month phone visit  [facility]  at [place]

## 2020-02-11 ENCOUNTER — Other Ambulatory Visit: Payer: Self-pay

## 2020-02-11 ENCOUNTER — Ambulatory Visit: Payer: Medicare Other

## 2020-02-11 DIAGNOSIS — I10 Essential (primary) hypertension: Secondary | ICD-10-CM

## 2020-02-11 DIAGNOSIS — E782 Mixed hyperlipidemia: Secondary | ICD-10-CM

## 2020-02-11 DIAGNOSIS — E1169 Type 2 diabetes mellitus with other specified complication: Secondary | ICD-10-CM

## 2020-02-11 NOTE — Patient Instructions (Addendum)
Visit Information It was great speaking with you today!  Please let me know if you have any questions about our visit.  Goals Addressed            This Visit's Progress   . Chronic Care Management       CARE PLAN ENTRY (see longitudinal plan of care for additional care plan information)  Current Barriers:  . Chronic Disease Management support, education, and care coordination needs related to Hypertension, Hyperlipidemia, Diabetes, Osteoarthritis and Allergic Rhinitis    Hypertension BP Readings from Last 3 Encounters:  10/07/19 120/72  04/30/19 131/76  03/11/19 140/90   . Pharmacist Clinical Goal(s): o Over the next 90 days, patient will work with PharmD and providers to maintain BP goal <130/80 . Current regimen:  o Lisinopril 5 mg daily . Interventions: o Discussed low salt diet and exercising as tolerated extensively o Will initiate blood pressure monitoring plan  . Patient self care activities - Over the next 90 days, patient will: o Check Blood Pressure 1-2 times weekly, document, and provide at future appointments o Ensure daily salt intake < 2300 mg/day  Hyperlipidemia Lab Results  Component Value Date/Time   LDLCALC 69 02/27/2019 07:43 AM   . Pharmacist Clinical Goal(s): o Over the next 90 days, patient will work with PharmD and providers to maintain LDL goal < 70 . Current regimen:  o Atorvastatin 20 mg daily . Interventions: o Discussed low cholesterol diet and exercising as tolerated extensively o Will initiate cholesterol monitoring plan   Diabetes Lab Results  Component Value Date/Time   HGBA1C 5.9 10/07/2019 07:11 AM   HGBA1C 6.3 02/27/2019 07:43 AM   HGBA1C 6.6 (H) 09/17/2018 10:51 AM   HGBA1C 5.9 05/30/2018 09:29 AM   . Pharmacist Clinical Goal(s): o Over the next 90 days, patient will work with PharmD and providers to maintain A1c goal <7% . Current regimen:  [name] Kitchen Glipizide 5 mg twice daily  . Metformin 1000 mg twice daily   . Interventions: o Recommend Stopping Glipizide o Recommend starting Ozempic 0.25 mg into the skin weekly o Continue Metformin o Discussed carbohydrate counting and exercising as tolerated extensively o Will initiate blood sugar monitoring plan  . Patient self care activities - Over the next 90 days, patient will: o Check blood sugar as needed, document, and provide at future appointments o Contact provider with any episodes of hypoglycemia  Medication management . Pharmacist Clinical Goal(s): o Over the next 90 days, patient will work with PharmD and providers to maintain optimal medication adherence . Current pharmacy: [facility] . Interventions o Comprehensive medication review performed. o Utilize [facility] for medication synchronization, packaging and delivery o Verbal consent obtained for [facility] enhanced pharmacy services (medication synchronization, adherence packaging, delivery coordination). A medication sync plan was created to allow patient to get all medications delivered once every 30 to 90 days per patient preference. Patient understands they have freedom to choose pharmacy and clinical pharmacist will coordinate care between all prescribers and [facility]. . Patient self care activities - Over the next 90 days, patient will: o Take medications as prescribed o Report any questions or concerns to PharmD and/or provider(s)       Jonathan Calhoun was given information about Chronic Care Management services today including:  1. CCM service includes personalized support from designated clinical staff supervised by his physician, including individualized plan of care and coordination with other care providers 2. 24/7 contact phone numbers for assistance for urgent and  routine care needs. 3. Standard insurance, coinsurance, copays and deductibles apply for chronic care management only during months in which we provide at least 20 minutes of  these services. Most insurances cover these services at 100%, however patients may be responsible for any copay, coinsurance and/or deductible if applicable. This service may help you avoid the need for more expensive face-to-face services. 4. Only one practitioner may furnish and bill the service in a calendar month. 5. The patient may stop CCM services at any time (effective at the end of the month) by phone call to the office staff.  Patient agreed to services and verbal consent obtained.   The patient verbalized understanding of instructions provided today and agreed to receive a mailed copy of patient instruction and/or educational materials. Face to Face appointment with pharmacist scheduled for:  05/31/20 at 9:00 AM  [facility] at [place]

## 2020-02-13 ENCOUNTER — Telehealth: Payer: Self-pay

## 2020-02-13 NOTE — Progress Notes (Signed)
Completed form for patient to come onboard with [facility]. Sent to the clinical pharmacist for review.  [place] Pharmacist Assistant [phone]

## 2020-02-17 ENCOUNTER — Telehealth: Payer: Self-pay

## 2020-02-17 NOTE — Progress Notes (Signed)
Certainly! Both Ozempic and Trulicity are T3 on his insurance so likely with either medication we will need to do patient assistance to make sure he can afford the medication. I will let him know about the Citalopram change and plan to bring him in to give him a sample of the Trulicity to start with and review how to use.  Thanks, Doristine Section Clinical Pharmacist [facility] at [place]

## 2020-02-17 NOTE — Progress Notes (Signed)
Spoke to patient to inform him per Junius Argyle the clinical Pharmacist  that after discussion with Tommi Rumps his PCP, they are going to make the following changes:   Decrease citalopram to 10 mg daily. Does he want [place] to send in a new prescription for the dose change?   We will plan to start Trulicity 7.71 mg injection into the skin once weekly to help with diabetes and weight management. Please let him know that we can give him a sample of the medication to make sure he can tolerate the medication. Please set up a 60-minute visit to speak with me in person in the clinic next month.   Patient states he will cut his current Citalopram that he has on hand.  Patient schedule appointment on 03/02/2020 at 1:00Pm with Junius Argyle the clinical Pharmacist.  Patient Verbalized Understanding  [place] Pharmacist Assistant [phone]

## 2020-02-17 NOTE — Progress Notes (Signed)
Please reach out to patient and inform him that after discussion with Tommi Rumps, we are going to make the following changes:   Decrease citalopram to 10 mg daily. Does he want [place] to send in a new prescription for the dose change?  We will plan to start Trulicity 7.61 mg injection into the skin once weekly to help with diabetes and weight management. Please let him know that we can give him a sample of the medication to make sure he can tolerate the medication. Please set up a 60-minute visit to speak with me in person in the clinic next month.   Thanks, Cristie Hem

## 2020-02-26 ENCOUNTER — Telehealth: Payer: Self-pay

## 2020-02-26 DIAGNOSIS — I10 Essential (primary) hypertension: Secondary | ICD-10-CM

## 2020-02-26 DIAGNOSIS — E782 Mixed hyperlipidemia: Secondary | ICD-10-CM

## 2020-02-26 NOTE — Progress Notes (Addendum)
° ° °  Chronic Care Management Pharmacy Assistant   Name: Jonathan Calhoun  MRN: [id number] DOB: 08/02/1950  Reason for Encounter: Medication Review/Patient assistance for Trulicity.   PCP : [name], NP  Allergies:  No Known Allergies  Medications: Outpatient Encounter Medications as of 02/26/2020  Medication Sig   atorvastatin (LIPITOR) 20 MG tablet TAKE 1 TABLET BY MOUTH DAILY.   citalopram (CELEXA) 20 MG tablet TAKE 1 TABLET BY MOUTH DAILY.   glipiZIDE (GLUCOTROL) 5 MG tablet Take 1 tablet (5 mg total) by mouth 2 (two) times daily before a meal.   glucose blood test strip Use as instructed   glucose blood test strip Use as instructed   Lancets (ONETOUCH ULTRASOFT) lancets Use as instructed   lisinopril (ZESTRIL) 5 MG tablet TAKE 1 TABLET BY MOUTH EVERY MORNING.   metFORMIN (GLUCOPHAGE) 1000 MG tablet Take one tablet BID   No facility-administered encounter medications on file as of 02/26/2020.    Current Diagnosis: Patient Active Problem List   Diagnosis Date Noted   Diabetes ([place]) 09/28/2015   Other cyst of bone, right lower leg 02/24/2013   Osteoarthritis of right hip 03/30/2011   AUTONOMIC NEUROPATHY, DIABETIC 04/29/2010   [place] DISEASE, LUMBAR 05/04/2007   Hyperlipidemia 02/13/2007   ERECTILE DYSFUNCTION 02/13/2007   Essential hypertension 02/13/2007   ALLERGIC RHINITIS 02/13/2007    Goals Addressed   None     Follow-Up:  Patient Assistance Coordination   Spoke to patient to inform him I have completed a Patient assistance form  for Trulicity .Informed patient to include a copy of his proof of income AND a copy of his Explanation of Benefits (EOB) statement from his insurance.  [place] Pharmacist Assistant [phone]

## 2020-02-26 NOTE — Progress Notes (Signed)
° ° °  Chronic Care Management Pharmacy Assistant   Name: Jonathan Calhoun  MRN: [id number] DOB: [birth year]  Reason for Encounter: Medication Review   PCP : [name], NP  Allergies:  No Known Allergies  Medications: Outpatient Encounter Medications as of 02/26/2020  Medication Sig   atorvastatin (LIPITOR) 20 MG tablet TAKE 1 TABLET BY MOUTH DAILY.   citalopram (CELEXA) 20 MG tablet TAKE 1 TABLET BY MOUTH DAILY.   glipiZIDE (GLUCOTROL) 5 MG tablet Take 1 tablet (5 mg total) by mouth 2 (two) times daily before a meal.   glucose blood test strip Use as instructed   glucose blood test strip Use as instructed   Lancets (ONETOUCH ULTRASOFT) lancets Use as instructed   lisinopril (ZESTRIL) 5 MG tablet TAKE 1 TABLET BY MOUTH EVERY MORNING.   metFORMIN (GLUCOPHAGE) 1000 MG tablet Take one tablet BID   No facility-administered encounter medications on file as of 02/26/2020.    Current Diagnosis: Patient Active Problem List   Diagnosis Date Noted   Diabetes ([place]) 09/28/2015   Other cyst of bone, right lower leg 02/24/2013   Osteoarthritis of right hip 03/30/2011   AUTONOMIC NEUROPATHY, DIABETIC 04/29/2010   [place] DISEASE, LUMBAR 05/04/2007   Hyperlipidemia 02/13/2007   ERECTILE DYSFUNCTION 02/13/2007   Essential hypertension 02/13/2007   ALLERGIC RHINITIS 02/13/2007      Follow-Up:  Pharmacist Review   Reviewed chart for medication changes ahead of medication coordination call.  No OVs, Consults, or hospital visits since last care coordination call/Pharmacist visit.   No medication changes indicated.  BP Readings from Last 3 Encounters:  10/07/19 120/72  04/30/19 131/76  03/11/19 140/90    Lab Results  Component Value Date   HGBA1C 5.9 10/07/2019     Patient obtains medications through Adherence Packaging  30 Days   Last adherence delivery included: (medication name and frequency)   None ID Patient declined (meds) last month due to PRN  use/additional supply on hand.  None ID Patient is due for next adherence delivery on: 03/05/2020. Called patient and reviewed medications and coordinated delivery.  This delivery to include:  Atorvastatin 20 Mg Tablet Daily-Breakfast  Citalopram 10 MG Tablet Daily- Breakfast  Glipizide 5 MG Tablet Twice Daily- Breakfast,Dinner  Lisinopril 5 MG Table Daily- Breakfast  Metformin 1000 MG Tablet- Breakfast,Dinner  Patient declined the following medications (meds) due to (reason)  None ID Patient needs refills for Citalopram 10 MG .  Confirmed delivery date of 03/05/2020, advised patient that pharmacy will contact them the morning of delivery.  [place] Pharmacist Assistant [phone]

## 2020-02-27 ENCOUNTER — Other Ambulatory Visit: Payer: Self-pay | Admitting: Adult Health

## 2020-02-27 DIAGNOSIS — Z760 Encounter for issue of repeat prescription: Secondary | ICD-10-CM

## 2020-02-27 MED ORDER — CITALOPRAM HYDROBROMIDE 20 MG PO TABS: 20.0000 mg | ORAL_TABLET | Freq: Every day | ORAL | 1 refills | Status: DC

## 2020-02-27 MED ORDER — TRULICITY 0.75 MG/0.5ML ~~LOC~~ SOAJ: 0.7500 mg | SUBCUTANEOUS | 0 refills | Status: DC

## 2020-03-01 ENCOUNTER — Telehealth: Payer: Self-pay

## 2020-03-01 NOTE — Progress Notes (Signed)
Spoke to patient to confirmed patient office appointment on 03/02/2020 for CCM at 8:30 AM with Junius Argyle the Clinical pharmacist.   Patient Verbalized understanding  [place] Pharmacist Assistant [phone]

## 2020-03-02 ENCOUNTER — Other Ambulatory Visit: Payer: Self-pay

## 2020-03-02 ENCOUNTER — Ambulatory Visit: Payer: Medicare Other

## 2020-03-02 ENCOUNTER — Other Ambulatory Visit: Payer: Self-pay | Admitting: Adult Health

## 2020-03-02 DIAGNOSIS — Z760 Encounter for issue of repeat prescription: Secondary | ICD-10-CM

## 2020-03-02 DIAGNOSIS — E1169 Type 2 diabetes mellitus with other specified complication: Secondary | ICD-10-CM

## 2020-03-02 DIAGNOSIS — I10 Essential (primary) hypertension: Secondary | ICD-10-CM

## 2020-03-02 MED ORDER — CITALOPRAM HYDROBROMIDE 10 MG PO TABS: 10.0000 mg | ORAL_TABLET | Freq: Every day | ORAL | 1 refills | Status: DC

## 2020-03-02 NOTE — Progress Notes (Signed)
Jonathan Calhoun,   Not a problem. Thanks for helping him with his trulicity   - Tommi Rumps

## 2020-03-02 NOTE — Chronic Care Management (AMB) (Signed)
Chronic Care Management Pharmacy  Name: Jonathan Calhoun  MRN: [id number] DOB: 21-Dec-1950   Chief Complaint/ HPI  [name],  69 y.o. , male presents for their Follow-Up CCM visit with the clinical pharmacist In office.  PCP : [name], NP Patient Care Team: [name], NP as PCP - General (Family Medicine) Ortho, [facility] (Orthopedic Surgery) Marlyce Huge, DDS as Consulting Physician (Dentistry) Germaine Pomfret, [facility] as Pharmacist (Pharmacist)  Their chronic conditions include: Hypertension, Hyperlipidemia, Diabetes, Osteoarthritis and Allergic Rhinitis   Office Visits: 02/11/20: CPP Visit 10/07/19: Patient presented to [name], NP for follow-up. A1c improved to 5.9%. Benzonatate and doxycyline stopped.   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   No Known Allergies  Medications: Outpatient Encounter Medications as of 03/02/2020  Medication Sig  . atorvastatin (LIPITOR) 20 MG tablet TAKE 1 TABLET BY MOUTH DAILY.  . citalopram (CELEXA) 20 MG tablet Take 1 tablet (20 mg total) by mouth daily.  . Dulaglutide (TRULICITY) 4.38 OI/7.5ZV SOPN Inject 0.75 mg into the skin once a week.  [name] Kitchen glipiZIDE (GLUCOTROL) 5 MG tablet Take 1 tablet (5 mg total) by mouth 2 (two) times daily before a meal.  . glucose blood test strip Use as instructed  . glucose blood test strip Use as instructed  . Lancets (ONETOUCH ULTRASOFT) lancets Use as instructed  . lisinopril (ZESTRIL) 5 MG tablet TAKE 1 TABLET BY MOUTH EVERY MORNING.  . metFORMIN (GLUCOPHAGE) 1000 MG tablet Take one tablet BID   No facility-administered encounter medications on file as of 03/02/2020.    Wt Readings from Last 3 Encounters:  10/07/19 240 lb (108.9 kg)  04/30/19 253 lb (114.8 kg)  02/27/19 242 lb (109.8 kg)    Current Diagnosis/Assessment:  SDOH Interventions     Most Recent Value  SDOH Interventions  Financial Strain Interventions Other (Comment)   [Will start PAP for new medication]  Transportation Interventions Intervention Not Indicated      Goals Addressed            This Visit's Progress   . Chronic Care Management       CARE PLAN ENTRY (see longitudinal plan of care for additional care plan information)  Current Barriers:  . Chronic Disease Management support, education, and care coordination needs related to Hypertension, Hyperlipidemia, Diabetes, Osteoarthritis and Allergic Rhinitis    Hypertension BP Readings from Last 3 Encounters:  10/07/19 120/72  04/30/19 131/76  03/11/19 140/90   . Pharmacist Clinical Goal(s): o Over the next 90 days, patient will work with PharmD and providers to maintain BP goal <130/80 . Current regimen:  o Lisinopril 5 mg daily . Interventions: o Discussed low salt diet and exercising as tolerated extensively o Will initiate blood pressure monitoring plan  . Patient self care activities - Over the next 90 days, patient will: o Check Blood Pressure 1-2 times weekly, document, and provide at future appointments o Ensure daily salt intake < 2300 mg/day  Hyperlipidemia Lab Results  Component Value Date/Time   LDLCALC 69 02/27/2019 07:43 AM   . Pharmacist Clinical Goal(s): o Over the next 90 days, patient will work with PharmD and providers to maintain LDL goal < 70 . Current regimen:  o Atorvastatin 20 mg daily . Interventions: o Discussed low cholesterol diet and exercising as tolerated extensively o Will initiate cholesterol monitoring plan   Diabetes Lab Results  Component Value Date/Time   HGBA1C 5.9 10/07/2019 07:11 AM   HGBA1C 6.3  02/27/2019 07:43 AM   HGBA1C 6.6 (H) 09/17/2018 10:51 AM   HGBA1C 5.9 05/30/2018 09:29 AM   . Pharmacist Clinical Goal(s): o Over the next 90 days, patient will work with PharmD and providers to maintain A1c goal <7% . Current regimen:  [name] Kitchen Glipizide 5 mg twice daily  . Metformin 1000 mg twice daily  . Interventions: o Recommend Stopping  Glipizide o Recommend starting Ozempic 0.25 mg into the skin weekly o Continue Metformin o Discussed carbohydrate counting and exercising as tolerated extensively o Will initiate blood sugar monitoring plan  . Patient self care activities - Over the next 90 days, patient will: o Check blood sugar as needed, document, and provide at future appointments o Contact provider with any episodes of hypoglycemia  Medication management . Pharmacist Clinical Goal(s): o Over the next 90 days, patient will work with PharmD and providers to maintain optimal medication adherence . Current pharmacy: [facility] . Interventions o Comprehensive medication review performed. o Utilize [facility] pharmacy for medication synchronization, packaging and delivery . Patient self care activities - Over the next 90 days, patient will: o Take medications as prescribed o Report any questions or concerns to PharmD and/or provider(s)      Diabetes   A1c goal <7%  Recent Relevant Labs: Lab Results  Component Value Date/Time   HGBA1C 5.9 10/07/2019 07:11 AM   HGBA1C 6.3 02/27/2019 07:43 AM   HGBA1C 6.6 (H) 09/17/2018 10:51 AM   HGBA1C 5.9 05/30/2018 09:29 AM   GFR 103.17 02/27/2019 07:43 AM   GFR 95.30 10/09/2017 08:01 AM   MICROALBUR 0.7 01/25/2016 08:57 AM   MICROALBUR 1.7 09/14/2015 08:42 AM    Last diabetic Eye exam:  Lab Results  Component Value Date/Time   HMDIABEYEEXA No Retinopathy 05/23/2018 12:00 AM    Last diabetic Foot exam: No results found for: HMDIABFOOTEX   Checking BG: Never  Recent FBG Readings: n/a Recent pre-meal BG readings: n/a Recent 2hr PP BG readings:  n/a Recent HS BG readings: n/a  Patient has failed these meds in past: n/a Patient is currently controlled on the following medications: [name] Kitchen Glipizide 5 mg twice daily  . Metformin 1000 mg twice daily   We discussed: diet and exercise extensively. Patient states he wants to try and lose 20 pounds of weight. His biggest  challenge is portion control. He states he has somewhat of an understanding of what to do in regards to diet, but struggles with following through. He wants to know if there is anything he take take that will help him.   Patient maintains mainly inactive lifestyle.   We discussed in detail today the administration, side effect profile, and storage of Trulicity. Patient was provided with two sample pens to ensure tolerability. Patient plans to give his first injection on 03/03/20.   Plan  Recommend stopping Glipizide on 03/03/20 and starting Trulicity 6.96 mg weekly.  CPA assessment in one week to discuss blood sugar readings and tolerability of Trulicity.   Hypertension   BP goal is:  <130/80  Office blood pressures are  BP Readings from Last 3 Encounters:  10/07/19 120/72  04/30/19 131/76  03/11/19 140/90   Patient checks BP at home several times per month Patient home BP readings are ranging: n/a  Patient has failed these meds in the past: n/a Patient is currently controlled on the following medications:  . Lisinopril 5 mg daily   We discussed diet and exercise extensively  Plan  Continue current medications   Hyperlipidemia  LDL goal < 70  Lipid Panel     Component Value Date/Time   CHOL 128 02/27/2019 0743   TRIG 90.0 02/27/2019 0743   HDL 40.20 02/27/2019 0743   LDLCALC 69 02/27/2019 0743    Hepatic Function Latest Ref Rng & Units 02/27/2019 10/09/2017 01/25/2016  Total Protein 6.0 - 8.3 g/dL 7.1 7.1 7.0  Albumin 3.5 - 5.2 g/dL 4.4 4.4 4.3  AST 0 - 37 U/L _0 ALT 0 - 53 U/L _1 Alk Phosphatase 39 - 117 U/L 83 90 91  Total Bilirubin 0.2 - 1.2 mg/dL 1.1 1.2 0.8  Bilirubin, Direct 0.0 - 0.3 mg/dL - 0.3 0.2     The ASCVD Risk score (Comfort., et al., 2013) failed to calculate for the following reasons:   The valid total cholesterol range is 130 to 320 mg/dL   Patient has failed these meds in past: n/a Patient is currently controlled on the  following medications:  . Atorvastatin 20 mg daily   We discussed:  diet and exercise extensively  Plan  Continue current medications  Depression / Anxiety   PHQ9 Score:  PHQ9 SCORE ONLY 03/11/2019 11/15/2016 09/28/2015  PHQ-9 Total Score 0 0 0   GAD7 Score: No flowsheet data found.  Patient has failed these meds in past: n/a Patient is currently controlled on the following medications:  . Citalopram 10 mg daily (started 1999)   We discussed:  Patient has been taking 10 mg daily for ~2 weeks and reports no changes in mood.  Plan  Recommend new Rx for citalopram 10 mg daily be sent into pharmacy.    Bone Health   Last DEXA Scan: n/a   T-Score femoral neck: n/a  T-Score total hip: n/a  T-Score lumbar spine: n/a  T-Score forearm radius: n/a  10-year probability of major osteoporotic fracture: n/a  10-year probability of hip fracture: n/a  No results found for: VD25OH   Patient is not a candidate for pharmacologic treatment  Patient has failed these meds in past: n/a Patient is currently uncontrolled on the following medications:  . None  We discussed:  Recommend [phone] units of vitamin D daily. Patient asking if he would benefit from taking a vitamin D supplement for his bones. Patient likely does not receive adequate vitamin D intake from sunlight.   Plan  Recommend checking vitamin D to ensure not deficient  Recommend starting Vitamin D 2000 units daily    Medication Management   Pt uses [facility] for all medications  Reviewed patient's [facility] medication and Epic medication profile assuring there are no discrepancies or gaps in therapy. Confirmed all fill dates appropriate and verified with patient that there is a sufficient quantity of all prescribed medications at home. Informed patient to call me any time if needing medications before scheduled deliveries. The anticipated medication sync date is 03/05/20.  Follow up: 3 month office visit  [facility] [place]

## 2020-03-03 ENCOUNTER — Encounter: Payer: PRIVATE HEALTH INSURANCE | Admitting: Adult Health

## 2020-03-09 ENCOUNTER — Encounter: Payer: Self-pay | Admitting: Adult Health

## 2020-03-09 ENCOUNTER — Telehealth (INDEPENDENT_AMBULATORY_CARE_PROVIDER_SITE_OTHER): Payer: Medicare Other | Admitting: Adult Health

## 2020-03-09 DIAGNOSIS — L303 Infective dermatitis: Secondary | ICD-10-CM | POA: Diagnosis not present

## 2020-03-09 DIAGNOSIS — B0089 Other herpesviral infection: Secondary | ICD-10-CM

## 2020-03-09 DIAGNOSIS — H25813 Combined forms of age-related cataract, bilateral: Secondary | ICD-10-CM | POA: Diagnosis not present

## 2020-03-09 DIAGNOSIS — B0230 Zoster ocular disease, unspecified: Secondary | ICD-10-CM | POA: Diagnosis not present

## 2020-03-09 DIAGNOSIS — E119 Type 2 diabetes mellitus without complications: Secondary | ICD-10-CM | POA: Diagnosis not present

## 2020-03-09 DIAGNOSIS — B0239 Other herpes zoster eye disease: Secondary | ICD-10-CM | POA: Diagnosis not present

## 2020-03-09 MED ORDER — VALACYCLOVIR HCL 1 G PO TABS: 1000.0000 mg | ORAL_TABLET | Freq: Two times a day (BID) | ORAL | 0 refills | Status: DC

## 2020-03-09 MED ORDER — PREDNISOLONE ACETATE 1 % OP SUSP: 1.0000 [drp] | Freq: Four times a day (QID) | OPHTHALMIC | 0 refills | Status: DC

## 2020-03-09 NOTE — Progress Notes (Signed)
Virtual Visit via Video Note  I connected with Jonathan Calhoun on 03/09/20 at 11:00 AM EDT by a video enabled telemedicine application and verified that I am speaking with the correct person using two identifiers.  Location patient: home Location provider:work or home office Persons participating in the virtual visit: patient, provider  I discussed the limitations of evaluation and management by telemedicine and the availability of in person appointments. The patient expressed understanding and agreed to proceed.   HPI: 69 year old male who is being evaluated today for concern of herpes zoster rash.  Reports that his symptoms started approximately 10 days ago when he started to develop painful Vesicles above his right eye.  Since that time he has had other vesicles show up around his right eye ( last being about 72 hours ago) with discomfort that radiates down to the right ear.  Over the course of the last 2 days he has noticed some mild blurred vision in his right eye.  He has had shingles in the past and reports that this feels similar.   ROS: See pertinent positives and negatives per HPI.  Past Medical History:  Diagnosis Date  . Allergy   . Anxiety   . Depression   . Diabetes mellitus    ORAL MEDS-NO INSULIN  . History of alcoholism ([place])   . History of shingles   . Hyperlipidemia   . Hypertension   . Ileus, postoperative ([place])    Post-op Chole Surgery    Past Surgical History:  Procedure Laterality Date  . BONE EXOSTOSIS EXCISION  08/10/2011   Procedure: EXOSTOSIS EXCISION;  Surgeon: Marcheta Grammes, DPM;  Location: [facility];  Service: Orthopedics;  Laterality: Left;  Exostectomy of Calcaneous Left Foot  . CARDIAC CATHETERIZATION  2002  . CHOLECYSTECTOMY    . ENDOVENOUS ABLATION SAPHENOUS VEIN W/ LASER Left 01/01/2019   endovenous laser ablation left greater saphenous vein by Ruta Hinds MD   . OSTECTOMY Right 04/03/2018   Procedure: OSTECTOMY RIGHT CALCANEUS;   Surgeon: Caprice Beaver, DPM;  Location: [facility];  Service: Podiatry;  Laterality: Right;  . RIGHT SHOULDER SURGERY FOR TEAR AND SPUR - YRS AGO    . SYNOVECTOMY  08/10/2011   Procedure: SYNOVECTOMY;  Surgeon: Marcheta Grammes, DPM;  Location: [facility];  Service: Orthopedics;  Laterality: Left;  Synovectomy of Peroneal Tendon Left Foot  . TENDON REPAIR  08/10/2011   Procedure: TENDON REPAIR;  Surgeon: Marcheta Grammes, DPM;  Location: [facility];  Service: Orthopedics;  Laterality: Left;  Repair of Peroneal Tendon Left Foot  . TENDON REPAIR Right 04/03/2018   Procedure: EXPLORATION OF PERONEAL TENDON WITH POSSIBLE REPAIR OF LONGITUDINAL TEAR OF THE PERONEAL TENDON RIGHT FOOT;  Surgeon: Caprice Beaver, DPM;  Location: [facility];  Service: Podiatry;  Laterality: Right;  . TONSILLECTOMY    . TOTAL HIP ARTHROPLASTY  03/29/2011   Procedure: TOTAL HIP ARTHROPLASTY;  Surgeon: Dione Plover Aluisio;  Location: [facility];  Service: Orthopedics;  Laterality: Right;    Family History  Problem Relation Age of Onset  . Depression Mother   . Cancer Mother        colon  . Heart failure Mother   . Colon cancer Mother   . Coronary artery disease Father   . Hypertension Father        Current Outpatient Medications:  .  atorvastatin (LIPITOR) 20 MG tablet, TAKE 1 TABLET BY MOUTH DAILY., Disp: 90 tablet, Rfl: 3 .  citalopram (CELEXA) 10 MG tablet, Take 1  tablet (10 mg total) by mouth daily., Disp: 90 tablet, Rfl: 1 .  Dulaglutide (TRULICITY) 1.61 WR/6.0AV SOPN, Inject 0.75 mg into the skin once a week., Disp: 6 mL, Rfl: 0 .  glucose blood test strip, Use as instructed, Disp: 100 each, Rfl: 3 .  glucose blood test strip, Use as instructed, Disp: 100 each, Rfl: 12 .  Lancets (ONETOUCH ULTRASOFT) lancets, Use as instructed, Disp: 100 each, Rfl: 12 .  lisinopril (ZESTRIL) 5 MG tablet, TAKE 1 TABLET BY MOUTH EVERY MORNING., Disp: 90 tablet, Rfl: 3 .  metFORMIN (GLUCOPHAGE) 1000 MG tablet, Take one tablet BID,  Disp: 180 tablet, Rfl: 1  EXAM:  VITALS per patient if applicable:  GENERAL: alert, oriented, appears well and in no acute distress  HEENT: Red vesicular appearing rash around his right eye and forehead.  Appears to be following the 5th cranial nerve.  NECK: normal movements of the head and neck  LUNGS: on inspection no signs of respiratory distress, breathing rate appears normal, no obvious gross SOB, gasping or wheezing  CV: no obvious cyanosis  MS: moves all visible extremities without noticeable abnormality  PSYCH/NEURO: pleasant and cooperative, no obvious depression or anxiety, speech and thought processing grossly intact  ASSESSMENT AND PLAN:  Discussed the following assessment and plan:  1. Herpes zoster dermatitis with ophthalmic complication - He is going to call his eye doctor today and make an appointment. Will treat due to symptoms   - valACYclovir (VALTREX) 1000 MG tablet; Take 1 tablet (1,000 mg total) by mouth 2 (two) times daily.  Dispense: 20 tablet; Refill: 0 - prednisoLONE acetate (PRED FORTE) 1 % ophthalmic suspension; Place 1 drop into the right eye 4 (four) times daily. X 7 days  Dispense: 5 mL; Refill: 0     I discussed the assessment and treatment plan with the patient. The patient was provided an opportunity to ask questions and all were answered. The patient agreed with the plan and demonstrated an understanding of the instructions.   The patient was advised to call back or seek an in-person evaluation if the symptoms worsen or if the condition fails to improve as anticipated.   [name], NP

## 2020-03-10 ENCOUNTER — Other Ambulatory Visit: Payer: Self-pay | Admitting: Adult Health

## 2020-03-10 MED ORDER — BLOOD GLUCOSE MONITOR KIT: PACK | 0 refills | Status: DC

## 2020-03-13 ENCOUNTER — Telehealth: Payer: Self-pay | Admitting: *Deleted

## 2020-03-13 MED ORDER — TRULICITY 0.75 MG/0.5ML ~~LOC~~ SOAJ: 0.7500 mg | SUBCUTANEOUS | 0 refills | Status: DC

## 2020-03-13 NOTE — Telephone Encounter (Signed)
-----   Message from Germaine Pomfret, [facility] sent at 03/02/2020  9:05 AM EDT ----- Regarding: Sample Documentation Apolonio Schneiders,   Patient received a sample of Trulicity 5.91 mg weekly (1 box, 2 pens)  Lot [id number]  Exp 10/28/2021  Can you please document that for me?   Thanks, Doristine Section Clinical Pharmacist [facility] [place]

## 2020-03-15 ENCOUNTER — Telehealth: Payer: Self-pay

## 2020-03-15 NOTE — Progress Notes (Signed)
Chronic Care Management Pharmacy Assistant   Name: Jonathan Calhoun  MRN: [id number] DOB: 07/31/1950  Reason for Encounter:Diabetes Disease State Call.  PCP : [name], NP  Allergies:  No Known Allergies  Medications: Outpatient Encounter Medications as of 03/15/2020  Medication Sig  . atorvastatin (LIPITOR) 20 MG tablet TAKE 1 TABLET BY MOUTH DAILY.  . blood glucose meter kit and supplies KIT Dispense based on patient and insurance preference. Use up to four times daily as directed. (FOR ICD-9 250.00, 250.01).  . citalopram (CELEXA) 10 MG tablet Take 1 tablet (10 mg total) by mouth daily.  . Dulaglutide (TRULICITY) 9.37 DS/2.8JG SOPN Inject 0.75 mg into the skin once a week.  [name] Kitchen glucose blood test strip Use as instructed  . glucose blood test strip Use as instructed  . Lancets (ONETOUCH ULTRASOFT) lancets Use as instructed  . lisinopril (ZESTRIL) 5 MG tablet TAKE 1 TABLET BY MOUTH EVERY MORNING.  . metFORMIN (GLUCOPHAGE) 1000 MG tablet Take one tablet BID  . prednisoLONE acetate (PRED FORTE) 1 % ophthalmic suspension Place 1 drop into the right eye 4 (four) times daily. X 7 days  . valACYclovir (VALTREX) 1000 MG tablet Take 1 tablet (1,000 mg total) by mouth 2 (two) times daily.   No facility-administered encounter medications on file as of 03/15/2020.    Current Diagnosis: Patient Active Problem List   Diagnosis Date Noted  . Diabetes ([place]) 09/28/2015  . Other cyst of bone, right lower leg 02/24/2013  . Osteoarthritis of right hip 03/30/2011  . AUTONOMIC NEUROPATHY, DIABETIC 04/29/2010  . [place] DISEASE, LUMBAR 05/04/2007  . Hyperlipidemia 02/13/2007  . ERECTILE DYSFUNCTION 02/13/2007  . Essential hypertension 02/13/2007  . ALLERGIC RHINITIS 02/13/2007   Follow-Up:  Pharmacist Review   Recent Relevant Labs: Lab Results  Component Value Date/Time   HGBA1C 5.9 10/07/2019 07:11 AM   HGBA1C 6.3 02/27/2019 07:43 AM   HGBA1C 6.6 (H) 09/17/2018 10:51 AM   HGBA1C 5.9  05/30/2018 09:29 AM   MICROALBUR 0.7 01/25/2016 08:57 AM   MICROALBUR 1.7 09/14/2015 08:42 AM    Kidney Function Lab Results  Component Value Date/Time   CREATININE 0.75 02/27/2019 07:43 AM   CREATININE 0.63 04/02/2018 01:40 PM   GFR 103.17 02/27/2019 07:43 AM   GFRNONAA >60 04/02/2018 01:40 PM   GFRAA >60 04/02/2018 01:40 PM    . Current antihyperglycemic regimen:  . Metformin 1000 mg twice daily    Dulaglutide (TRULICITY) 8.11 XB/2.6OM . What recent interventions/DTPs have been made to improve glycemic control:  o 35/59/[id number] Samples of Trulicity  o 68/07/2120 Stopped Glipizide 5 mg twice daily  . Have there been any recent hospitalizations or ED visits since last visit with CPP? No . Patient denies hypoglycemic symptoms, including Pale, Sweaty, Shaky, Hungry, Nervous/irritable and Vision changes . Patient denies hyperglycemic symptoms, including blurry vision, excessive thirst, fatigue, polyuria and weakness . How often are you checking your blood sugar?  o Patient states he is waiting to hear back from [facility] about getting a meter for him to check his blood sugar. . What are your blood sugars ranging?  o Fasting: n/a o Before meals: n/a o After meals: n/a o Bedtime: n/a . During the week, how often does your blood glucose drop below 70? Never . Are you checking your feet daily/regularly?   Patient denies numbness, pain or tingling sensation in his feet.   Patient states he is tolerating Trulicity very well. Patient states he does not have a big change  in his appetite(his appetite slow down a little).Patient reports having two meals a day with a few snacks.    Patient reports he has a upcoming annual visit with his PCP on 03/17/20.  Adherence Review: Is the patient currently on a STATIN medication? Yes Is the patient currently on ACE/ARB medication? Yes Does the patient have >5 day gap between last estimated fill dates? Yes  [place]  Pharmacist Assistant [phone]    Maryjean Ka

## 2020-03-17 ENCOUNTER — Encounter: Payer: Self-pay | Admitting: Adult Health

## 2020-03-17 ENCOUNTER — Ambulatory Visit (INDEPENDENT_AMBULATORY_CARE_PROVIDER_SITE_OTHER): Payer: Medicare Other | Admitting: Adult Health

## 2020-03-17 ENCOUNTER — Other Ambulatory Visit: Payer: Self-pay

## 2020-03-17 VITALS — BP 120/80 | HR 66 | Temp 98.6°F | Ht 74.0 in | Wt 245.0 lb

## 2020-03-17 DIAGNOSIS — E782 Mixed hyperlipidemia: Secondary | ICD-10-CM

## 2020-03-17 DIAGNOSIS — E1169 Type 2 diabetes mellitus with other specified complication: Secondary | ICD-10-CM

## 2020-03-17 DIAGNOSIS — N401 Enlarged prostate with lower urinary tract symptoms: Secondary | ICD-10-CM | POA: Diagnosis not present

## 2020-03-17 DIAGNOSIS — R351 Nocturia: Secondary | ICD-10-CM | POA: Diagnosis not present

## 2020-03-17 DIAGNOSIS — E559 Vitamin D deficiency, unspecified: Secondary | ICD-10-CM

## 2020-03-17 DIAGNOSIS — I10 Essential (primary) hypertension: Secondary | ICD-10-CM | POA: Diagnosis not present

## 2020-03-17 DIAGNOSIS — Z0000 Encounter for general adult medical examination without abnormal findings: Secondary | ICD-10-CM

## 2020-03-17 DIAGNOSIS — Z23 Encounter for immunization: Secondary | ICD-10-CM | POA: Diagnosis not present

## 2020-03-17 MED ORDER — BLOOD GLUCOSE MONITOR KIT: PACK | 0 refills | Status: DC

## 2020-03-17 NOTE — Patient Instructions (Signed)
It was great seeing you today   We will follow up with you regarding your blood work   Lets see you back in 3 months to see how you are doing with Trulicity

## 2020-03-17 NOTE — Addendum Note (Signed)
Addended by: Marrion Coy on: 03/17/2020 08:17 AM   Modules accepted: Orders

## 2020-03-17 NOTE — Progress Notes (Signed)
Subjective:    Patient ID: Jonathan Calhoun, male    DOB: August 15, 1950, 69 y.o.   MRN: [id number]  HPI Patient presents for yearly preventative medicine examination. He I a pleasant 69 year old male who  has a past medical history of Allergy, Anxiety, Depression, Diabetes mellitus, History of alcoholism ([place]), History of shingles, Hyperlipidemia, Hypertension, and Ileus, postoperative ([place]).  DM -the past he was maintained on Metformin 1000 mg twice daily and glipizide 5 mg twice daily.  Earlier this month he was started on Trulicity 3.15 mg weekly instead of taking glipizide twice daily to see if this could help with some weight loss.  Since starting Trulicity he denies any side effects and denies any hypoglycemic events. He has not been able to get his glucometer yet.   Lab Results  Component Value Date   HGBA1C 5.9 10/07/2019   Hyperlipidemia -takes Lipitor 20 mg daily. He denies myalgia for fatigue   Lab Results  Component Value Date   CHOL 128 02/27/2019   HDL 40.20 02/27/2019   LDLCALC 69 02/27/2019   TRIG 90.0 02/27/2019   CHOLHDL 3 02/27/2019   Hypertension - takes lisinopril 5 mg.  He denies dizziness, lightheadedness, chest pain, or shortness of breath BP Readings from Last 3 Encounters:  03/17/20 120/80  10/07/19 120/72  04/30/19 131/76   Depression - well controlled with Celexa 10 mg daily.   All immunizations and health maintenance protocols were reviewed with the patient and needed orders were placed.  Appropriate screening laboratory values were ordered for the patient including screening of hyperlipidemia, renal function and hepatic function. If indicated by BPH, a PSA was ordered.  Medication reconciliation,  past medical history, social history, problem list and allergies were reviewed in detail with the patient  Goals were established with regard to weight loss, exercise, and  diet in compliance with medications.  He stays active and tries to eat healthy but  admits that portion control is his biggest issue.  He would like to drop about 20 pounds. Wt Readings from Last 3 Encounters:  03/17/20 245 lb (111.1 kg)  10/07/19 240 lb (108.9 kg)  04/30/19 253 lb (114.8 kg)    Review of Systems  Constitutional: Negative.   HENT: Negative.   Eyes: Negative.   Respiratory: Negative.   Cardiovascular: Negative.   Gastrointestinal: Negative.   Endocrine: Negative.   Genitourinary: Negative.   Musculoskeletal: Negative.   Skin: Negative.   Allergic/Immunologic: Negative.   Neurological: Negative.   Hematological: Negative.   Psychiatric/Behavioral: Negative.   All other systems reviewed and are negative.  Past Medical History:  Diagnosis Date  . Allergy   . Anxiety   . Depression   . Diabetes mellitus    ORAL MEDS-NO INSULIN  . History of alcoholism ([place])   . History of shingles   . Hyperlipidemia   . Hypertension   . Ileus, postoperative ([place])    Post-op Chole Surgery    Social History   Socioeconomic History  . Marital status: Married    Spouse name: Not on file  . Number of children: Not on file  . Years of education: Not on file  . Highest education level: Not on file  Occupational History  . Not on file  Tobacco Use  . Smoking status: Current Every Day Smoker    Packs/day: 0.00    Types: Cigars  . Smokeless tobacco: Never Used  Vaping Use  . Vaping Use: Never used  Substance and Sexual Activity  .  Alcohol use: No    Comment: quit 3 yrs ago  . Drug use: No  . Sexual activity: Not on file  Other Topics Concern  . Not on file  Social History Narrative   Married, lives with spouse.   Plans for home after surgery.   Living will, healthcare POA.   Social Determinants of Health   Financial Resource Strain: Low Risk   . Difficulty of Paying Living Expenses: Not very hard  Food Insecurity:   . Worried About Programme researcher, broadcasting/film/video in the Last Year: Not on file  . Ran Out of Food in the Last Year: Not on file    Transportation Needs: No Transportation Needs  . Lack of Transportation (Medical): No  . Lack of Transportation (Non-Medical): No  Physical Activity:   . Days of Exercise per Week: Not on file  . Minutes of Exercise per Session: Not on file  Stress:   . Feeling of Stress : Not on file  Social Connections:   . Frequency of Communication with Friends and Family: Not on file  . Frequency of Social Gatherings with Friends and Family: Not on file  . Attends Religious Services: Not on file  . Active Member of Clubs or Organizations: Not on file  . Attends Banker Meetings: Not on file  . Marital Status: Not on file  Intimate Partner Violence:   . Fear of Current or Ex-Partner: Not on file  . Emotionally Abused: Not on file  . Physically Abused: Not on file  . Sexually Abused: Not on file    Past Surgical History:  Procedure Laterality Date  . BONE EXOSTOSIS EXCISION  08/10/2011   Procedure: EXOSTOSIS EXCISION;  Surgeon: Dallas Schimke, DPM;  Location: [facility];  Service: Orthopedics;  Laterality: Left;  Exostectomy of Calcaneous Left Foot  . CARDIAC CATHETERIZATION  2002  . CHOLECYSTECTOMY    . ENDOVENOUS ABLATION SAPHENOUS VEIN W/ LASER Left 01/01/2019   endovenous laser ablation left greater saphenous vein by Fabienne Bruns MD   . OSTECTOMY Right 04/03/2018   Procedure: OSTECTOMY RIGHT CALCANEUS;  Surgeon: Ferman Hamming, DPM;  Location: [facility];  Service: Podiatry;  Laterality: Right;  . RIGHT SHOULDER SURGERY FOR TEAR AND SPUR - YRS AGO    . SYNOVECTOMY  08/10/2011   Procedure: SYNOVECTOMY;  Surgeon: Dallas Schimke, DPM;  Location: [facility];  Service: Orthopedics;  Laterality: Left;  Synovectomy of Peroneal Tendon Left Foot  . TENDON REPAIR  08/10/2011   Procedure: TENDON REPAIR;  Surgeon: Dallas Schimke, DPM;  Location: [facility];  Service: Orthopedics;  Laterality: Left;  Repair of Peroneal Tendon Left Foot  . TENDON REPAIR Right 04/03/2018    Procedure: EXPLORATION OF PERONEAL TENDON WITH POSSIBLE REPAIR OF LONGITUDINAL TEAR OF THE PERONEAL TENDON RIGHT FOOT;  Surgeon: Ferman Hamming, DPM;  Location: [facility];  Service: Podiatry;  Laterality: Right;  . TONSILLECTOMY    . TOTAL HIP ARTHROPLASTY  03/29/2011   Procedure: TOTAL HIP ARTHROPLASTY;  Surgeon: Gus Rankin Aluisio;  Location: [facility];  Service: Orthopedics;  Laterality: Right;    Family History  Problem Relation Age of Onset  . Depression Mother   . Cancer Mother        colon  . Heart failure Mother   . Colon cancer Mother   . Coronary artery disease Father   . Hypertension Father     No Known Allergies  Current Outpatient Medications on File Prior to Visit  Medication Sig Dispense  Refill  . atorvastatin (LIPITOR) 20 MG tablet TAKE 1 TABLET BY MOUTH DAILY. 90 tablet 3  . blood glucose meter kit and supplies KIT Dispense based on patient and insurance preference. Use up to four times daily as directed. (FOR ICD-9 250.00, 250.01). 1 each 0  . citalopram (CELEXA) 10 MG tablet Take 1 tablet (10 mg total) by mouth daily. 90 tablet 1  . Dulaglutide (TRULICITY) 7.53 YY/5.1TM SOPN Inject 0.75 mg into the skin once a week. 1 mL 0  . glucose blood test strip Use as instructed 100 each 3  . glucose blood test strip Use as instructed 100 each 12  . Lancets (ONETOUCH ULTRASOFT) lancets Use as instructed 100 each 12  . lisinopril (ZESTRIL) 5 MG tablet TAKE 1 TABLET BY MOUTH EVERY MORNING. 90 tablet 3  . metFORMIN (GLUCOPHAGE) 1000 MG tablet Take one tablet BID 180 tablet 1  . prednisoLONE acetate (PRED FORTE) 1 % ophthalmic suspension Place 1 drop into the right eye 4 (four) times daily. X 7 days 5 mL 0  . valACYclovir (VALTREX) 1000 MG tablet Take 1 tablet (1,000 mg total) by mouth 2 (two) times daily. 20 tablet 0   No current facility-administered medications on file prior to visit.    BP 120/80 (BP Location: Left Arm, Patient Position: Sitting, Cuff Size: Large)   Pulse 66    Temp 98.6 F (37 C) (Oral)   Ht $R'6\' 2"'eo$  (1.88 m)   Wt 245 lb (111.1 kg)   SpO2 96%   BMI 31.46 kg/m       Objective:   Physical Exam Vitals and nursing note reviewed.  Constitutional:      General: He is not in acute distress.    Appearance: Normal appearance. He is well-developed. He is obese.  HENT:     Head: Normocephalic and atraumatic.     Right Ear: Tympanic membrane, ear canal and external ear normal. There is no impacted cerumen.     Left Ear: Tympanic membrane, ear canal and external ear normal. There is no impacted cerumen.     Nose: Nose normal. No congestion or rhinorrhea.     Mouth/Throat:     Mouth: Mucous membranes are moist.     Pharynx: Oropharynx is clear. No oropharyngeal exudate or posterior oropharyngeal erythema.  Eyes:     General:        Right eye: No discharge.        Left eye: No discharge.     Extraocular Movements: Extraocular movements intact.     Conjunctiva/sclera: Conjunctivae normal.     Pupils: Pupils are equal, round, and reactive to light.  Neck:     Vascular: No carotid bruit.     Trachea: No tracheal deviation.  Cardiovascular:     Rate and Rhythm: Normal rate and regular rhythm.     Pulses: Normal pulses.     Heart sounds: Normal heart sounds. No murmur heard.  No friction rub. No gallop.   Pulmonary:     Effort: Pulmonary effort is normal. No respiratory distress.     Breath sounds: Normal breath sounds. No stridor. No wheezing, rhonchi or rales.  Chest:     Chest wall: No tenderness.  Abdominal:     General: Bowel sounds are normal. There is no distension.     Palpations: Abdomen is soft. There is no mass.     Tenderness: There is no abdominal tenderness. There is no right CVA tenderness, left CVA tenderness, guarding or rebound.  Hernia: No hernia is present.  Musculoskeletal:        General: No swelling, tenderness, deformity or signs of injury. Normal range of motion.     Right lower leg: No edema.     Left lower leg: No  edema.  Lymphadenopathy:     Cervical: No cervical adenopathy.  Skin:    General: Skin is warm and dry.     Capillary Refill: Capillary refill takes less than 2 seconds.     Coloration: Skin is not jaundiced or pale.     Findings: No bruising, erythema, lesion or rash.  Neurological:     General: No focal deficit present.     Mental Status: He is alert and oriented to person, place, and time.     Cranial Nerves: No cranial nerve deficit.     Sensory: No sensory deficit.     Motor: No weakness.     Coordination: Coordination normal.     Gait: Gait normal.     Deep Tendon Reflexes: Reflexes normal.  Psychiatric:        Mood and Affect: Mood normal.        Behavior: Behavior normal.        Thought Content: Thought content normal.        Judgment: Judgment normal.       Assessment & Plan:  1. Routine general medical examination at a health care facility - Follow up in one year or sooner if needed - Continue to work on weight loss through diet and exercise  - CBC with Differential/Platelet; Future - Hemoglobin A1c; Future - Lipid panel; Future - TSH; Future - CMP with eGFR([facility]); Future  2. Type 2 diabetes mellitus with other specified complication, unspecified whether long term insulin use ([place]) - Will have him follow up in 3 months to see how he is doing on trulicity  - CBC with Differential/Platelet; Future - Hemoglobin A1c; Future - Lipid panel; Future - TSH; Future - CMP with eGFR([facility]); Future  3. Mixed hyperlipidemia - Consider increase in statin  - CBC with Differential/Platelet; Future - Hemoglobin A1c; Future - Lipid panel; Future - TSH; Future - CMP with eGFR([facility]); Future  4. Essential hypertension - Well controlled on current medications  - CBC with Differential/Platelet; Future - Hemoglobin A1c; Future - Lipid panel; Future - TSH; Future - CMP with eGFR([facility]); Future  5. BPH associated with nocturia  - PSA; Future  6. Vitamin D  deficiency  - VITAMIN D 25 Hydroxy (Vit-D Deficiency, Fractures); Future  [name], NP

## 2020-03-18 LAB — CBC WITH DIFFERENTIAL/PLATELET
Absolute Monocytes: 656 cells/uL (ref 200–950)
Basophils Absolute: 49 cells/uL (ref 0–200)
Basophils Relative: 0.6 %
Eosinophils Absolute: 164 cells/uL (ref 15–500)
Eosinophils Relative: 2 %
HCT: 44.8 % (ref 38.5–50.0)
Hemoglobin: 15.2 g/dL (ref 13.2–17.1)
Lymphs Abs: 2772 cells/uL (ref 850–3900)
MCH: 31.4 pg (ref 27.0–33.0)
MCHC: 33.9 g/dL (ref 32.0–36.0)
MCV: 92.6 fL (ref 80.0–100.0)
MPV: 9.8 fL (ref 7.5–12.5)
Monocytes Relative: 8 %
Neutro Abs: 4559 cells/uL (ref 1500–7800)
Neutrophils Relative %: 55.6 %
Platelets: 222 10*3/uL (ref 140–400)
RBC: 4.84 10*6/uL (ref 4.20–5.80)
RDW: 13.6 % (ref 11.0–15.0)
Total Lymphocyte: 33.8 %
WBC: 8.2 10*3/uL (ref 3.8–10.8)

## 2020-03-18 LAB — HEMOGLOBIN A1C
Hgb A1c MFr Bld: 6.3 % of total Hgb — ABNORMAL HIGH (ref <5.7)
Mean Plasma Glucose: 134 (calc)
eAG (mmol/L): 7.4 (calc)

## 2020-03-18 LAB — COMPLETE METABOLIC PANEL WITH GFR
AG Ratio: 2 (calc) (ref 1.0–2.5)
ALT: 19 U/L (ref 9–46)
AST: 17 U/L (ref 10–35)
Albumin: 4.5 g/dL (ref 3.6–5.1)
Alkaline phosphatase (APISO): 81 U/L (ref 35–144)
BUN: 12 mg/dL (ref 7–25)
CO2: 23 mmol/L (ref 20–32)
Calcium: 9.6 mg/dL (ref 8.6–10.3)
Chloride: 105 mmol/L (ref 98–110)
Creat: 0.73 mg/dL (ref 0.70–1.18)
GFR, Est African American: 109 mL/min/{1.73_m2} (ref >=60)
GFR, Est Non African American: 94 mL/min/{1.73_m2} (ref >=60)
Globulin: 2.3 g/dL (calc) (ref 1.9–3.7)
Glucose, Bld: 127 mg/dL — ABNORMAL HIGH (ref 65–99)
Potassium: 5 mmol/L (ref 3.5–5.3)
Sodium: 140 mmol/L (ref 135–146)
Total Bilirubin: 0.6 mg/dL (ref 0.2–1.2)
Total Protein: 6.8 g/dL (ref 6.1–8.1)

## 2020-03-18 LAB — LIPID PANEL
Cholesterol: 137 mg/dL (ref <200)
HDL: 39 mg/dL — ABNORMAL LOW (ref >=40)
LDL Cholesterol (Calc): 70 mg/dL (calc)
Non-HDL Cholesterol (Calc): 98 mg/dL (calc) (ref <130)
Total CHOL/HDL Ratio: 3.5 (calc) (ref <5.0)
Triglycerides: 215 mg/dL — ABNORMAL HIGH (ref <150)

## 2020-03-18 LAB — TSH: TSH: 1.64 mIU/L (ref 0.40–4.50)

## 2020-03-18 LAB — PSA: PSA: 0.17 ng/mL (ref <=4.0)

## 2020-03-18 LAB — VITAMIN D 25 HYDROXY (VIT D DEFICIENCY, FRACTURES): Vit D, 25-Hydroxy: 39 ng/mL (ref 30–100)

## 2020-03-19 ENCOUNTER — Telehealth: Payer: Self-pay

## 2020-03-19 NOTE — Progress Notes (Signed)
    Chronic Care Management Pharmacy Assistant   Name: Jonathan Calhoun  MRN: [id number] DOB: 1951/05/22  Reason for Encounter: Medication Review  PCP : [name], NP  Allergies:  No Known Allergies  Medications: Outpatient Encounter Medications as of 03/19/2020  Medication Sig  . atorvastatin (LIPITOR) 20 MG tablet TAKE 1 TABLET BY MOUTH DAILY.  . blood glucose meter kit and supplies KIT Dispense based on patient and insurance preference. Use up to four times daily as directed. (FOR ICD-9 250.00, 250.01).  . citalopram (CELEXA) 10 MG tablet Take 1 tablet (10 mg total) by mouth daily.  . Dulaglutide (TRULICITY) 6.76 PP/5.0DT SOPN Inject 0.75 mg into the skin once a week.  [name] Kitchen glucose blood test strip Use as instructed  . glucose blood test strip Use as instructed  . Lancets (ONETOUCH ULTRASOFT) lancets Use as instructed  . lisinopril (ZESTRIL) 5 MG tablet TAKE 1 TABLET BY MOUTH EVERY MORNING.  . metFORMIN (GLUCOPHAGE) 1000 MG tablet Take one tablet BID  . prednisoLONE acetate (PRED FORTE) 1 % ophthalmic suspension Place 1 drop into the right eye 4 (four) times daily. X 7 days  . valACYclovir (VALTREX) 1000 MG tablet Take 1 tablet (1,000 mg total) by mouth 2 (two) times daily.   No facility-administered encounter medications on file as of 03/19/2020.    Current Diagnosis: Patient Active Problem List   Diagnosis Date Noted  . Diabetes ([place]) 09/28/2015  . Other cyst of bone, right lower leg 02/24/2013  . Osteoarthritis of right hip 03/30/2011  . AUTONOMIC NEUROPATHY, DIABETIC 04/29/2010  . [place] DISEASE, LUMBAR 05/04/2007  . Hyperlipidemia 02/13/2007  . ERECTILE DYSFUNCTION 02/13/2007  . Essential hypertension 02/13/2007  . ALLERGIC RHINITIS 02/13/2007    Follow-Up:  Medication Cost Review   Performed cost analysis for patient, estimated yearly medication cost of $937.77.  [place] Pharmacist Assistant [phone]

## 2020-03-25 ENCOUNTER — Telehealth: Payer: Self-pay

## 2020-03-25 DIAGNOSIS — E1169 Type 2 diabetes mellitus with other specified complication: Secondary | ICD-10-CM

## 2020-03-25 DIAGNOSIS — I10 Essential (primary) hypertension: Secondary | ICD-10-CM

## 2020-03-25 NOTE — Progress Notes (Addendum)
Chronic Care Management Pharmacy Assistant   Name: Jonathan Calhoun  MRN: [id number] DOB: 07/11/1950  Reason for Encounter: Medication Review  PCP : [name], NP  Allergies:  No Known Allergies  Medications: Outpatient Encounter Medications as of 03/25/2020  Medication Sig  . atorvastatin (LIPITOR) 20 MG tablet TAKE 1 TABLET BY MOUTH DAILY.  . blood glucose meter kit and supplies KIT Dispense based on patient and insurance preference. Use up to four times daily as directed. (FOR ICD-9 250.00, 250.01).  . citalopram (CELEXA) 10 MG tablet Take 1 tablet (10 mg total) by mouth daily.  . Dulaglutide (TRULICITY) 2.75 TZ/0.0FV SOPN Inject 0.75 mg into the skin once a week.  [name] Kitchen glucose blood test strip Use as instructed  . glucose blood test strip Use as instructed  . Lancets (ONETOUCH ULTRASOFT) lancets Use as instructed  . lisinopril (ZESTRIL) 5 MG tablet TAKE 1 TABLET BY MOUTH EVERY MORNING.  . metFORMIN (GLUCOPHAGE) 1000 MG tablet Take one tablet BID  . prednisoLONE acetate (PRED FORTE) 1 % ophthalmic suspension Place 1 drop into the right eye 4 (four) times daily. X 7 days  . valACYclovir (VALTREX) 1000 MG tablet Take 1 tablet (1,000 mg total) by mouth 2 (two) times daily.   No facility-administered encounter medications on file as of 03/25/2020.    Current Diagnosis: Patient Active Problem List   Diagnosis Date Noted  . Diabetes ([place]) 09/28/2015  . Other cyst of bone, right lower leg 02/24/2013  . Osteoarthritis of right hip 03/30/2011  . AUTONOMIC NEUROPATHY, DIABETIC 04/29/2010  . [place] DISEASE, LUMBAR 05/04/2007  . Hyperlipidemia 02/13/2007  . ERECTILE DYSFUNCTION 02/13/2007  . Essential hypertension 02/13/2007  . ALLERGIC RHINITIS 02/13/2007    Follow-Up:  Pharmacist Review   Reviewed chart for medication changes ahead of medication coordination call.  OVs, Consults, or hospital visits since last care coordination call/Pharmacist visit.   03/17/20 PCP    Medication changes indicated.  03/02/20 PCP Stopped Glipizide, Started Trulicity   BP Readings from Last 3 Encounters:  03/17/20 120/80  10/07/19 120/72  04/30/19 131/76    Lab Results  Component Value Date   HGBA1C 6.3 (H) 03/17/2020     Patient obtains medications through Adherence Packaging  30 Days   Last adherence delivery included: (medication name and frequency)     Atorvastatin 20 Mg Tablet Daily-Breakfast             Citalopram 10 MG Tablet Daily- Breakfast             Glipizide 5 MG Tablet Twice Daily- Breakfast,Dinner             Lisinopril 5 MG Table Daily- Breakfast             Metformin 1000 MG Tablet- Breakfast,Dinner Patient declined (meds) last month due to PRN use/additional supply on hand.     None ID  Patient is due for next adherence delivery on: 04/01/20. Called patient and reviewed medications and coordinated delivery.  This delivery to include:   Atorvastatin 20 Mg Tablet Daily-Breakfast             Citalopram 10 MG Tablet Daily- Breakfast   Lisinopril 5 MG Table Daily- Breakfast             Metformin 1000 MG Tablet- Breakfast,Dinner  Trulicity 4.94 MG-  Inject into skin weekly.(Patient states he has enough for one week but will be out of town the following week).  Vitamin D3 2,000 Tablet Daily-Breakfast  Patient declined the following medications (meds) due to (reason)  Prednisolone acetate 1% Ophthalmic - adequate supply  Valacyclovir 1000 MG Tablet-adequate supply  Patient needs refills for None ID.  Confirmed delivery date of 04/01/20, advised patient that pharmacy will contact them the morning of delivery.  [place] Pharmacist Assistant [phone]

## 2020-03-31 DIAGNOSIS — B0239 Other herpes zoster eye disease: Secondary | ICD-10-CM | POA: Diagnosis not present

## 2020-03-31 DIAGNOSIS — E119 Type 2 diabetes mellitus without complications: Secondary | ICD-10-CM | POA: Diagnosis not present

## 2020-03-31 DIAGNOSIS — H25813 Combined forms of age-related cataract, bilateral: Secondary | ICD-10-CM | POA: Diagnosis not present

## 2020-04-05 ENCOUNTER — Telehealth: Payer: Self-pay | Admitting: Pharmacist

## 2020-04-05 NOTE — Progress Notes (Signed)
Patient is requesting refill for Trulicity 7.30 MG. Patient states he would like to receive Trulicity before Friday because he will be on vacation for the holidays.  [place] Pharmacist Assistant [phone]

## 2020-04-28 ENCOUNTER — Telehealth: Payer: Self-pay | Admitting: Pharmacist

## 2020-04-28 NOTE — Chronic Care Management (AMB) (Signed)
Chronic Care Management Pharmacy Assistant   Name: Jonathan Calhoun  MRN: [id number] DOB: 1951/05/04  Reason for Encounter: Medication Review  Patient Questions:  1.  Have you seen any other providers since your last visit? No  2.  Any changes in your medicines or health? No   PCP : [name], NP  Allergies:  No Known Allergies  Medications: Outpatient Encounter Medications as of 04/28/2020  Medication Sig  . atorvastatin (LIPITOR) 20 MG tablet TAKE 1 TABLET BY MOUTH DAILY.  . blood glucose meter kit and supplies KIT Dispense based on patient and insurance preference. Use up to four times daily as directed. (FOR ICD-9 250.00, 250.01).  . Cholecalciferol (VITAMIN D3) 50 MCG (2000 UT) TABS Take 2,000 Units by mouth daily.  . citalopram (CELEXA) 10 MG tablet Take 1 tablet (10 mg total) by mouth daily.  . Dulaglutide (TRULICITY) 2.45 YK/9.9IP SOPN Inject 0.75 mg into the skin once a week.  [name] Kitchen glucose blood test strip Use as instructed  . glucose blood test strip Use as instructed  . Lancets (ONETOUCH ULTRASOFT) lancets Use as instructed  . lisinopril (ZESTRIL) 5 MG tablet TAKE 1 TABLET BY MOUTH EVERY MORNING.  . metFORMIN (GLUCOPHAGE) 1000 MG tablet Take one tablet BID  . prednisoLONE acetate (PRED FORTE) 1 % ophthalmic suspension Place 1 drop into the right eye 4 (four) times daily. X 7 days  . valACYclovir (VALTREX) 1000 MG tablet Take 1 tablet (1,000 mg total) by mouth 2 (two) times daily.   No facility-administered encounter medications on file as of 04/28/2020.    Current Diagnosis: Patient Active Problem List   Diagnosis Date Noted  . Diabetes ([place]) 09/28/2015  . Other cyst of bone, right lower leg 02/24/2013  . Osteoarthritis of right hip 03/30/2011  . AUTONOMIC NEUROPATHY, DIABETIC 04/29/2010  . [place] DISEASE, LUMBAR 05/04/2007  . Hyperlipidemia 02/13/2007  . ERECTILE DYSFUNCTION 02/13/2007  . Essential hypertension 02/13/2007  . ALLERGIC RHINITIS 02/13/2007    Reviewed chart for medication changes ahead of medication coordination call. No OVs, Consults, or hospital visits since last care coordination call/Pharmacist visit.  No medication changes indicated   BP Readings from Last 3 Encounters:  03/17/20 120/80  10/07/19 120/72  04/30/19 131/76    Lab Results  Component Value Date   HGBA1C 6.3 (H) 03/17/2020     Patient obtains medications through Adherence Packaging  30 Days   Last adherence delivery included:  . Atorvastatin 20 mg: one at breakfast . Citalopram 10 mg: one at breakfast . Lisinopril 5 mg: one at breakfast . Metformin 1000 mg one at breakfast and one at dinner . Trulicity 3.82 MG- Inject into skin weekly . Vitamin D3 2,000 units: one tablet at breakfast  Patient is due for next adherence delivery on: 05-05-2020. Called patient and reviewed medications and coordinated delivery. This delivery to include: . Atorvastatin 20 mg: one at breakfast . Citalopram 10 mg: one at breakfast . Lisinopril 5 mg: one at breakfast . Metformin 1000 mg one at breakfast and one at dinner . Vitamin D3 2,000 units: one tablet at breakfast  Patient declined the following medications due to supply on hand: . Trulicity 5.05 MG- Inject into skin weekly  The patient needs refills for: . Lisinopril 5 mg . Metformin 1000 mg  Confirmed delivery date of 05-05-2020, advised patient that pharmacy will contact them the morning of delivery.  Follow-Up:  Coordination of Enhanced Pharmacy Services and Pharmacist Review   He was reminded to complete  patient assistance paperwork for his Trulicity. He states he will have this completed and return to the office this week.  Maia Breslow, [place] Assistant [phone]

## 2020-05-03 NOTE — Progress Notes (Signed)
Subjective:   Jonathan Calhoun is a 69 y.o. male who presents for Medicare Annual/Subsequent preventive examination.  10% of this visit was conducted as a video visit. Telephone call was initiated after several drops on the video call.  I connected with Jonathan Calhoun today by telephone and verified that I am speaking with the correct person using two identifiers. Location patient: home Location provider: work Persons participating in the virtual visit: patient, provider.   I discussed the limitations, risks, security and privacy concerns of performing an evaluation and management service by telephone and the availability of in person appointments. I also discussed with the patient that there may be a patient responsible charge related to this service. The patient expressed understanding and verbally consented to this telephonic visit.    Interactive audio and video telecommunications were attempted between this provider and patient, however failed, due to patient having technical difficulties OR patient did not have access to video capability.  We continued and completed visit with audio only.       Review of Systems    N/A  Cardiac Risk Factors include: advanced age (>35mn, >>68women);obesity (BMI >30kg/m2);male gender;diabetes mellitus;hypertension;dyslipidemia     Objective:    Today's Vitals   There is no height or weight on file to calculate BMI.  Advanced Directives 05/04/2020 07/02/2018 04/03/2018 04/02/2018 11/15/2016 12/16/2015 08/10/2011  Does Patient Have a Medical Advance Directive? No _0  Patient has advance directive, copy in chart  Type of Advance Directive - [place] will [place] will Healthcare Power of [place] will [place] will -  Does patient want to make changes to medical advance directive? - No - Patient declined No - Patient  declined No - Patient declined - - -  Copy of [place] Chart? - - No - copy requested No - copy requested No - copy requested No - copy requested -  Would patient like information on creating a medical advance directive? No - Patient declined - No - Patient declined No - Patient declined - - -  Pre-existing out of facility DNR order (yellow form or pink MOST form) - - - - - - -    Current Medications (verified) Outpatient Encounter Medications as of 05/04/2020  Medication Sig  . atorvastatin (LIPITOR) 20 MG tablet TAKE 1 TABLET BY MOUTH DAILY.  . blood glucose meter kit and supplies KIT Dispense based on patient and insurance preference. Use up to four times daily as directed. (FOR ICD-9 250.00, 250.01).  . Cholecalciferol (VITAMIN D3) 50 MCG (2000 UT) TABS Take 2,000 Units by mouth daily.  . citalopram (CELEXA) 10 MG tablet Take 1 tablet (10 mg total) by mouth daily.  . Dulaglutide (TRULICITY) 03.22MGU/5.4YHSOPN Inject 0.75 mg into the skin once a week.  .[name] Kitchenglucose blood test strip Use as instructed  . glucose blood test strip Use as instructed  . Lancets (ONETOUCH ULTRASOFT) lancets Use as instructed  . lisinopril (ZESTRIL) 5 MG tablet TAKE 1 TABLET BY MOUTH EVERY MORNING.  . metFORMIN (GLUCOPHAGE) 1000 MG tablet Take one tablet BID  . prednisoLONE acetate (PRED FORTE) 1 % ophthalmic suspension Place 1 drop into the right eye 4 (four) times daily. X 7 days (Patient not taking: Reported on 05/04/2020)  . [DISCONTINUED] valACYclovir (VALTREX) 1000 MG tablet Take 1 tablet (1,000 mg total) by mouth 2 (two) times daily. (Patient not taking: Reported on  05/04/2020)   No facility-administered encounter medications on file as of 05/04/2020.    Allergies (verified) Patient has no known allergies.   History: Past Medical History:  Diagnosis Date  . Allergy   . Anxiety   . Depression   . Diabetes mellitus    ORAL MEDS-NO INSULIN  . History of alcoholism ([place])   .  History of shingles   . Hyperlipidemia   . Hypertension   . Ileus, postoperative ([place])    Post-op Chole Surgery   Past Surgical History:  Procedure Laterality Date  . BONE EXOSTOSIS EXCISION  08/10/2011   Procedure: EXOSTOSIS EXCISION;  Surgeon: Marcheta Grammes, DPM;  Location: [facility];  Service: Orthopedics;  Laterality: Left;  Exostectomy of Calcaneous Left Foot  . CARDIAC CATHETERIZATION  2002  . CHOLECYSTECTOMY    . ENDOVENOUS ABLATION SAPHENOUS VEIN W/ LASER Left 01/01/2019   endovenous laser ablation left greater saphenous vein by Ruta Hinds MD   . OSTECTOMY Right 04/03/2018   Procedure: OSTECTOMY RIGHT CALCANEUS;  Surgeon: Caprice Beaver, DPM;  Location: [facility];  Service: Podiatry;  Laterality: Right;  . RIGHT SHOULDER SURGERY FOR TEAR AND SPUR - YRS AGO    . SYNOVECTOMY  08/10/2011   Procedure: SYNOVECTOMY;  Surgeon: Marcheta Grammes, DPM;  Location: [facility];  Service: Orthopedics;  Laterality: Left;  Synovectomy of Peroneal Tendon Left Foot  . TENDON REPAIR  08/10/2011   Procedure: TENDON REPAIR;  Surgeon: Marcheta Grammes, DPM;  Location: [facility];  Service: Orthopedics;  Laterality: Left;  Repair of Peroneal Tendon Left Foot  . TENDON REPAIR Right 04/03/2018   Procedure: EXPLORATION OF PERONEAL TENDON WITH POSSIBLE REPAIR OF LONGITUDINAL TEAR OF THE PERONEAL TENDON RIGHT FOOT;  Surgeon: Caprice Beaver, DPM;  Location: [facility];  Service: Podiatry;  Laterality: Right;  . TONSILLECTOMY    . TOTAL HIP ARTHROPLASTY  03/29/2011   Procedure: TOTAL HIP ARTHROPLASTY;  Surgeon: Dione Plover Aluisio;  Location: [facility];  Service: Orthopedics;  Laterality: Right;   Family History  Problem Relation Age of Onset  . Depression Mother   . Cancer Mother        colon  . Heart failure Mother   . Colon cancer Mother   . Coronary artery disease Father   . Hypertension Father    Social History   Socioeconomic History  . Marital status: Married    Spouse name: Not on file   . Number of children: Not on file  . Years of education: Not on file  . Highest education level: Not on file  Occupational History  . Not on file  Tobacco Use  . Smoking status: Current Every Day Smoker    Packs/day: 0.00    Types: Cigars  . Smokeless tobacco: Never Used  Vaping Use  . Vaping Use: Never used  Substance and Sexual Activity  . Alcohol use: No    Comment: quit 3 yrs ago  . Drug use: No  . Sexual activity: Not on file  Other Topics Concern  . Not on file  Social History Narrative   Married, lives with spouse.   Plans for home after surgery.   Living will, healthcare POA.   Social Determinants of Health   Financial Resource Strain: Low Risk   . Difficulty of Paying Living Expenses: Not hard at all  Food Insecurity: No Food Insecurity  . Worried About Charity fundraiser in the Last Year: Never true  . Ran Out of Food in the Last  Year: Never true  Transportation Needs: No Transportation Needs  . Lack of Transportation (Medical): No  . Lack of Transportation (Non-Medical): No  Physical Activity: Inactive  . Days of Exercise per Week: 0 days  . Minutes of Exercise per Session: 0 min  Stress: No Stress Concern Present  . Feeling of Stress : Not at all  Social Connections: Socially Integrated  . Frequency of Communication with Friends and Family: More than three times a week  . Frequency of Social Gatherings with Friends and Family: More than three times a week  . Attends Religious Services: More than 4 times per year  . Active Member of Clubs or Organizations: Yes  . Attends Archivist Meetings: More than 4 times per year  . Marital Status: Married    Tobacco Counseling Ready to quit: Not Answered Counseling given: Not Answered   Clinical Intake:  Pre-visit preparation completed: Yes  Pain : No/denies pain     Nutritional Risks: Nausea/ vomitting/ diarrhea (diarrhea once changes trulicity) Diabetes: Yes CBG done?: No Did pt. bring in  CBG monitor from home?: No  How often do you need to have someone help you when you read instructions, pamphlets, or other written materials from your doctor or pharmacy?: 1 - Never What is the last grade level you completed in school?: College  Diabetic?Yes Nutrition Risk Assessment:  Has the patient had any N/V/D within the last 2 months?  Yes  Does the patient have any non-healing wounds?  No  Has the patient had any unintentional weight loss or weight gain?  No   Diabetes:  Is the patient diabetic?  Yes  If diabetic, was a CBG obtained today?  No  Did the patient bring in their glucometer from home?  No  How often do you monitor your CBG's? Patient states does not check glucose at home .   Financial Strains and Diabetes Management:  Are you having any financial strains with the device, your supplies or your medication? No .  Does the patient want to be seen by Chronic Care Management for management of their diabetes?  No  Would the patient like to be referred to a Nutritionist or for Diabetic Management?  No   Diabetic Exams:  Diabetic Eye Exam: Overdue for diabetic eye exam. Pt has been advised about the importance in completing this exam. Patient advised to call and schedule an eye exam. Diabetic Foot Exam: Completed 03/17/2020   Interpreter Needed?: No  Information entered by :: [place] of Daily Living In your present state of health, do you have any difficulty performing the following activities: 05/04/2020  Hearing? N  Vision? N  Difficulty concentrating or making decisions? N  Walking or climbing stairs? N  Dressing or bathing? N  Doing errands, shopping? N  Preparing Food and eating ? N  Using the Toilet? N  In the past six months, have you accidently leaked urine? N  Do you have problems with loss of bowel control? N  Managing your Medications? N  Managing your Finances? N  Housekeeping or managing your Housekeeping? N  Some recent data  might be hidden    Patient Care Team: [name], NP as PCP - General (Family Medicine) Ortho, [facility] (Orthopedic Surgery) Marlyce Huge, DDS as Consulting Physician (Dentistry) Germaine Pomfret, [facility] as Pharmacist (Pharmacist)  Indicate any recent Medical Services you may have received from other than [facility] providers in the past year (date may be approximate).  Assessment:   This is a routine wellness examination for [place].  Hearing/Vision screen  Hearing Screening   _0  _1  _2  _3  _4  _5  _6  _7  _8   Right ear:           Left ear:           Vision Screening Comments: Patient states gets eyes examined once per year. Is due for cataracts surgery. First eye surgery 05/07/2020  Dietary issues and exercise activities discussed: Current Exercise Habits: The patient does not participate in regular exercise at present, Exercise limited by: None identified  Goals    .  Chronic Care Management      CARE PLAN ENTRY (see longitudinal plan of care for additional care plan information)  Current Barriers:  . Chronic Disease Management support, education, and care coordination needs related to Hypertension, Hyperlipidemia, Diabetes, Osteoarthritis and Allergic Rhinitis    Hypertension BP Readings from Last 3 Encounters:  10/07/19 120/72  04/30/19 131/76  03/11/19 140/90   . Pharmacist Clinical Goal(s): o Over the next 90 days, patient will work with PharmD and providers to maintain BP goal <130/80 . Current regimen:  o Lisinopril 5 mg daily . Interventions: o Discussed low salt diet and exercising as tolerated extensively o Will initiate blood pressure monitoring plan  . Patient self care activities - Over the next 90 days, patient will: o Check Blood Pressure 1-2 times weekly, document, and provide at future appointments o Ensure daily salt intake < 2300 mg/day  Hyperlipidemia Lab Results  Component Value Date/Time   LDLCALC 69 02/27/2019 07:43  AM   . Pharmacist Clinical Goal(s): o Over the next 90 days, patient will work with PharmD and providers to maintain LDL goal < 70 . Current regimen:  o Atorvastatin 20 mg daily . Interventions: o Discussed low cholesterol diet and exercising as tolerated extensively o Will initiate cholesterol monitoring plan   Diabetes Lab Results  Component Value Date/Time   HGBA1C 5.9 10/07/2019 07:11 AM   HGBA1C 6.3 02/27/2019 07:43 AM   HGBA1C 6.6 (H) 09/17/2018 10:51 AM   HGBA1C 5.9 05/30/2018 09:29 AM   . Pharmacist Clinical Goal(s): o Over the next 90 days, patient will work with PharmD and providers to maintain A1c goal <7% . Current regimen:  [name] Kitchen Glipizide 5 mg twice daily  . Metformin 1000 mg twice daily  . Interventions: o Recommend Stopping Glipizide o Recommend starting Ozempic 0.25 mg into the skin weekly o Continue Metformin o Discussed carbohydrate counting and exercising as tolerated extensively o Will initiate blood sugar monitoring plan  . Patient self care activities - Over the next 90 days, patient will: o Check blood sugar as needed, document, and provide at future appointments o Contact provider with any episodes of hypoglycemia  Medication management . Pharmacist Clinical Goal(s): o Over the next 90 days, patient will work with PharmD and providers to maintain optimal medication adherence . Current pharmacy: [facility] . Interventions o Comprehensive medication review performed. o Utilize [facility] for medication synchronization, packaging and delivery . Patient self care activities - Over the next 90 days, patient will: o Take medications as prescribed o Report any questions or concerns to PharmD and/or provider(s)    .  Increase physical activity      Start Silver Sneakers 3 days weekly.    .  Quit smoking / using tobacco      Planned quit date: December 31, 2016    .  Weight (lb) < 220 lb (99.8 kg) (pt-stated)  Goal weight: 220 lbs by next  year      Depression Screen PHQ 2/9 Scores 05/04/2020 03/17/2020 03/11/2019 11/15/2016 09/28/2015  PHQ - 2 Score 0 1 0 0 0  PHQ- 9 Score 0 1 - - -    Fall Risk Fall Risk  05/04/2020 03/17/2020 03/11/2019 04/11/2018 12/11/2016  Falls in the past year? 0 0 0 0 No  Comment - - - [place] Telephone Survey: data to providers prior to load [employer] Telephone Survey: data to providers prior to load  Number falls in past yr: 0 0 0 - -  Injury with Fall? 0 0 0 - -  Risk for fall due to : History of fall(s) - - - -  Follow up Falls evaluation completed;Falls prevention discussed - Falls evaluation completed - -    FALL RISK PREVENTION PERTAINING TO THE HOME:  Any stairs in or around the home? Yes  If so, are there any without handrails? No  Home free of loose throw rugs in walkways, pet beds, electrical cords, etc? Yes  Adequate lighting in your home to reduce risk of falls? Yes   ASSISTIVE DEVICES UTILIZED TO PREVENT FALLS:  Life alert? No  Use of a cane, walker or w/c? No  Grab bars in the bathroom? No  Shower chair or bench in shower? No  Elevated toilet seat or a handicapped toilet? No     Cognitive Function:   Normal cognitive status assessed by direct observation by this Nurse Health Advisor. No abnormalities found.        Immunizations Immunization History  Administered Date(s) Administered  . Fluad Quad(high Dose 65+) 02/27/2019, 03/17/2020  . Influenza Split 02/14/2011, 01/29/2012  . Influenza Whole 05/22/2006  . Influenza, High Dose Seasonal PF 05/30/2018  . Influenza,inj,Quad PF,6+ Mos 02/24/2013, 02/12/2014  . PFIZER SARS-COV-2 Vaccination 07/06/2019, 07/29/2019  . Pneumococcal Conjugate-13 02/12/2014  . Pneumococcal Polysaccharide-23 05/22/2000, 05/03/2007, 10/24/2016  . Td 05/22/1997  . Tdap 01/29/2012    TDAP status: Up to date  Flu Vaccine status: Up to date  Pneumococcal vaccine status: Up to date  Covid-19 vaccine status: Completed vaccines  Qualifies for  Shingles Vaccine? Yes   Zostavax completed No   Shingrix Completed?: No.    Education has been provided regarding the importance of this vaccine. Patient has been advised to call insurance company to determine out of pocket expense if they have not yet received this vaccine. Advised may also receive vaccine at local pharmacy or Health Dept. Verbalized acceptance and understanding.  Screening Tests Health Maintenance  Topic Date Due  . OPHTHALMOLOGY EXAM  05/24/2019  . COVID-19 Vaccine (3 - Booster for Pfizer series) 01/29/2020  . HEMOGLOBIN A1C  09/15/2020  . COLONOSCOPY  12/20/2020  . FOOT EXAM  03/17/2021  . TETANUS/TDAP  01/28/2022  . INFLUENZA VACCINE  Completed  . Hepatitis C Screening  Completed  . PNA vac Low Risk Adult  Completed    Health Maintenance  Health Maintenance Due  Topic Date Due  . OPHTHALMOLOGY EXAM  05/24/2019  . COVID-19 Vaccine (3 - Booster for Pfizer series) 01/29/2020    Colorectal cancer screening: Type of screening: Colonoscopy. Completed 12/21/2015. Repeat every 5 years  Lung Cancer Screening: (Low Dose CT Chest recommended if Age 58-80 years, 30 pack-year currently smoking OR have quit w/in 15years.) does not qualify.   Lung Cancer Screening Referral: N/a   Additional Screening:  Hepatitis C Screening: does qualify; Completed 10/09/2017  Vision Screening: Recommended annual ophthalmology exams for early detection  of glaucoma and other disorders of the eye. Is the patient up to date with their annual eye exam?  Yes  Who is the provider or what is the name of the office in which the patient attends annual eye exams? Dr. Katy Fitch  If pt is not established with a provider, would they like to be referred to a provider to establish care? No .   Dental Screening: Recommended annual dental exams for proper oral hygiene  Community Resource Referral / Chronic Care Management: CRR required this visit?  No   CCM required this visit?  No      Plan:      I have personally reviewed and noted the following in the patient's chart:   . Medical and social history . Use of alcohol, tobacco or illicit drugs  . Current medications and supplements . Functional ability and status . Nutritional status . Physical activity . Advanced directives . List of other physicians . Hospitalizations, surgeries, and ER visits in previous 12 months . Vitals . Screenings to include cognitive, depression, and falls . Referrals and appointments  In addition, I have reviewed and discussed with patient certain preventive protocols, quality metrics, and best practice recommendations. A written personalized care plan for preventive services as well as general preventive health recommendations were provided to patient.     Ofilia Neas, LPN   [id number]   Nurse Notes: None

## 2020-05-04 ENCOUNTER — Other Ambulatory Visit: Payer: Self-pay

## 2020-05-04 ENCOUNTER — Ambulatory Visit (INDEPENDENT_AMBULATORY_CARE_PROVIDER_SITE_OTHER): Payer: Medicare Other

## 2020-05-04 ENCOUNTER — Other Ambulatory Visit: Payer: Self-pay | Admitting: Adult Health

## 2020-05-04 DIAGNOSIS — Z0000 Encounter for general adult medical examination without abnormal findings: Secondary | ICD-10-CM | POA: Diagnosis not present

## 2020-05-04 DIAGNOSIS — I10 Essential (primary) hypertension: Secondary | ICD-10-CM

## 2020-05-04 DIAGNOSIS — Z760 Encounter for issue of repeat prescription: Secondary | ICD-10-CM

## 2020-05-04 DIAGNOSIS — E1169 Type 2 diabetes mellitus with other specified complication: Secondary | ICD-10-CM

## 2020-05-04 NOTE — Patient Instructions (Signed)
Jonathan Calhoun , Thank you for taking time to come for your Medicare Wellness Visit. I appreciate your ongoing commitment to your health goals. Please review the following plan we discussed and let me know if I can assist you in the future.   Screening recommendations/referrals: Colonoscopy: Up to date, next due 12/20/2020 Recommended yearly ophthalmology/optometry visit for glaucoma screening and checkup Recommended yearly dental visit for hygiene and checkup  Vaccinations: Influenza vaccine: Up to date, next due fall 2022  Pneumococcal vaccine: Completed series  Tdap vaccine: Up to date, next due 01/28/2022 Shingles vaccine: Currently due, we recommend that your receive Shingrix at your local pharmacy as it is less expensive.     Advanced directives: Advance directive discussed with you today. Even though you declined this today please call our office should you change your mind and we can give you the proper paperwork for you to fill out.   Conditions/risks identified: Please try to incorporate some physical activity into your daily routine   Next appointment: 06/02/2020 @ 9:00 am with Pharmacist   Preventive Care 70 Years and Older, Male Preventive care refers to lifestyle choices and visits with your health care provider that can promote health and wellness. What does preventive care include?  A yearly physical exam. This is also called an annual well check.  Dental exams once or twice a year.  Routine eye exams. Ask your health care provider how often you should have your eyes checked.  Personal lifestyle choices, including:  Daily care of your teeth and gums.  Regular physical activity.  Eating a healthy diet.  Avoiding tobacco and drug use.  Limiting alcohol use.  Practicing safe sex.  Taking low doses of aspirin every day.  Taking vitamin and mineral supplements as recommended by your health care provider. What happens during an annual well check? The services and  screenings done by your health care provider during your annual well check will depend on your age, overall health, lifestyle risk factors, and family history of disease. Counseling  Your health care provider may ask you questions about your:  Alcohol use.  Tobacco use.  Drug use.  Emotional well-being.  Home and relationship well-being.  Sexual activity.  Eating habits.  History of falls.  Memory and ability to understand (cognition).  Work and work Statistician. Screening  You may have the following tests or measurements:  Height, weight, and BMI.  Blood pressure.  Lipid and cholesterol levels. These may be checked every 5 years, or more frequently if you are over 70 years old.  Skin check.  Lung cancer screening. You may have this screening every year starting at age 70 if you have a 30-pack-year history of smoking and currently smoke or have quit within the past 15 years.  Fecal occult blood test (FOBT) of the stool. You may have this test every year starting at age 70.  Flexible sigmoidoscopy or colonoscopy. You may have a sigmoidoscopy every 5 years or a colonoscopy every 10 years starting at age 70.  Prostate cancer screening. Recommendations will vary depending on your family history and other risks.  Hepatitis C blood test.  Hepatitis B blood test.  Sexually transmitted disease (STD) testing.  Diabetes screening. This is done by checking your blood sugar (glucose) after you have not eaten for a while (fasting). You may have this done every 1-3 years.  Abdominal aortic aneurysm (AAA) screening. You may need this if you are a current or former smoker.  Osteoporosis. You may be  screened starting at age 45 if you are at high risk. Talk with your health care provider about your test results, treatment options, and if necessary, the need for more tests. Vaccines  Your health care provider may recommend certain vaccines, such as:  Influenza vaccine. This is  recommended every year.  Tetanus, diphtheria, and acellular pertussis (Tdap, Td) vaccine. You may need a Td booster every 10 years.  Zoster vaccine. You may need this after age 80.  Pneumococcal 13-valent conjugate (PCV13) vaccine. One dose is recommended after age 38.  Pneumococcal polysaccharide (PPSV23) vaccine. One dose is recommended after age 46. Talk to your health care provider about which screenings and vaccines you need and how often you need them. This information is not intended to replace advice given to you by your health care provider. Make sure you discuss any questions you have with your health care provider. Document Released: 06/04/2015 Document Revised: 01/26/2016 Document Reviewed: 03/09/2015 Elsevier Interactive Patient Education  2017 [place] Prevention in the Home Falls can cause injuries. They can happen to people of all ages. There are many things you can do to make your home safe and to help prevent falls. What can I do on the outside of my home?  Regularly fix the edges of walkways and driveways and fix any cracks.  Remove anything that might make you trip as you walk through a door, such as a raised step or threshold.  Trim any bushes or trees on the path to your home.  Use bright outdoor lighting.  Clear any walking paths of anything that might make someone trip, such as rocks or tools.  Regularly check to see if handrails are loose or broken. Make sure that both sides of any steps have handrails.  Any raised decks and porches should have guardrails on the edges.  Have any leaves, snow, or ice cleared regularly.  Use sand or salt on walking paths during winter.  Clean up any spills in your garage right away. This includes oil or grease spills. What can I do in the bathroom?  Use night lights.  Install grab bars by the toilet and in the tub and shower. Do not use towel bars as grab bars.  Use non-skid mats or decals in the tub or  shower.  If you need to sit down in the shower, use a plastic, non-slip stool.  Keep the floor dry. Clean up any water that spills on the floor as soon as it happens.  Remove soap buildup in the tub or shower regularly.  Attach bath mats securely with double-sided non-slip rug tape.  Do not have throw rugs and other things on the floor that can make you trip. What can I do in the bedroom?  Use night lights.  Make sure that you have a light by your bed that is easy to reach.  Do not use any sheets or blankets that are too big for your bed. They should not hang down onto the floor.  Have a firm chair that has side arms. You can use this for support while you get dressed.  Do not have throw rugs and other things on the floor that can make you trip. What can I do in the kitchen?  Clean up any spills right away.  Avoid walking on wet floors.  Keep items that you use a lot in easy-to-reach places.  If you need to reach something above you, use a strong step stool that has a  grab bar.  Keep electrical cords out of the way.  Do not use floor polish or wax that makes floors slippery. If you must use wax, use non-skid floor wax.  Do not have throw rugs and other things on the floor that can make you trip. What can I do with my stairs?  Do not leave any items on the stairs.  Make sure that there are handrails on both sides of the stairs and use them. Fix handrails that are broken or loose. Make sure that handrails are as long as the stairways.  Check any carpeting to make sure that it is firmly attached to the stairs. Fix any carpet that is loose or worn.  Avoid having throw rugs at the top or bottom of the stairs. If you do have throw rugs, attach them to the floor with carpet tape.  Make sure that you have a light switch at the top of the stairs and the bottom of the stairs. If you do not have them, ask someone to add them for you. What else can I do to help prevent  falls?  Wear shoes that:  Do not have high heels.  Have rubber bottoms.  Are comfortable and fit you well.  Are closed at the toe. Do not wear sandals.  If you use a stepladder:  Make sure that it is fully opened. Do not climb a closed stepladder.  Make sure that both sides of the stepladder are locked into place.  Ask someone to hold it for you, if possible.  Clearly mark and make sure that you can see:  Any grab bars or handrails.  First and last steps.  Where the edge of each step is.  Use tools that help you move around (mobility aids) if they are needed. These include:  Canes.  Walkers.  Scooters.  Crutches.  Turn on the lights when you go into a dark area. Replace any light bulbs as soon as they burn out.  Set up your furniture so you have a clear path. Avoid moving your furniture around.  If any of your floors are uneven, fix them.  If there are any pets around you, be aware of where they are.  Review your medicines with your doctor. Some medicines can make you feel dizzy. This can increase your chance of falling. Ask your doctor what other things that you can do to help prevent falls. This information is not intended to replace advice given to you by your health care provider. Make sure you discuss any questions you have with your health care provider. Document Released: 03/04/2009 Document Revised: 10/14/2015 Document Reviewed: 06/12/2014 Elsevier Interactive Patient Education  2017 [employer].

## 2020-05-07 DIAGNOSIS — H25811 Combined forms of age-related cataract, right eye: Secondary | ICD-10-CM | POA: Diagnosis not present

## 2020-05-24 ENCOUNTER — Telehealth: Payer: Self-pay | Admitting: Pharmacist

## 2020-05-24 DIAGNOSIS — E1169 Type 2 diabetes mellitus with other specified complication: Secondary | ICD-10-CM

## 2020-05-25 NOTE — Chronic Care Management (AMB) (Signed)
Chronic Care Management Pharmacy Assistant   Name: Jonathan Calhoun  MRN: [id number] DOB: Dec 21, 1950  Reason for Encounter: Medication Review  PCP : Jonathan Peng, NP  Allergies:  No Known Allergies  Medications: Outpatient Encounter Medications as of 05/24/2020  Medication Sig  . atorvastatin (LIPITOR) 20 MG tablet TAKE 1 TABLET BY MOUTH DAILY.  . blood glucose meter kit and supplies KIT Dispense based on patient and insurance preference. Use up to four times daily as directed. (FOR ICD-9 250.00, 250.01).  . Cholecalciferol (VITAMIN D3) 50 MCG (2000 UT) TABS Take 2,000 Units by mouth daily.  . citalopram (CELEXA) 10 MG tablet Take 1 tablet (10 mg total) by mouth daily.  . Dulaglutide (TRULICITY) 7.34 KA/7.6OT SOPN Inject 0.75 mg into the skin once a week.  [name] Kitchen glucose blood test strip Use as instructed  . glucose blood test strip Use as instructed  . Lancets (ONETOUCH ULTRASOFT) lancets Use as instructed  . lisinopril (ZESTRIL) 5 MG tablet TAKE ONE TABLET BY MOUTH EVERY MORNING  . metFORMIN (GLUCOPHAGE) 1000 MG tablet TAKE ONE TABLET BY MOUTH EVERY MORNING and TAKE ONE TABLET BY MOUTH EVERY EVENING  . prednisoLONE acetate (PRED FORTE) 1 % ophthalmic suspension Place 1 drop into the right eye 4 (four) times daily. X 7 days (Patient not taking: Reported on 05/04/2020)   No facility-administered encounter medications on file as of 05/24/2020.    Current Diagnosis: Patient Active Problem List   Diagnosis Date Noted  . Diabetes ([place]) 09/28/2015  . Other cyst of bone, right lower leg 02/24/2013  . Osteoarthritis of right hip 03/30/2011  . AUTONOMIC NEUROPATHY, DIABETIC 04/29/2010  . [place] DISEASE, LUMBAR 05/04/2007  . Hyperlipidemia 02/13/2007  . ERECTILE DYSFUNCTION 02/13/2007  . Essential hypertension 02/13/2007  . ALLERGIC RHINITIS 02/13/2007    Goals Addressed   None     Reviewed chart for medication changes ahead of medication coordination call. No OVs, Consults, or  hospital visits since last care coordination call/Pharmacist visit.  No medication changes indicated   BP Readings from Last 3 Encounters:  03/17/20 120/80  10/07/19 120/72  04/30/19 131/76    Lab Results  Component Value Date   HGBA1C 6.3 (H) 03/17/2020     Patient obtains medications through Adherence Packaging  30 Days   Last adherence delivery included:  . Atorvastatin 20 mg: one tablet at breakfast . Citalopram 10 mg: one tablet at breakfast . Lisinopril 5 mg: one tablet at breakfast . Metformin 1000 mg: one tablet at breakfast and on at dinner . Vitamin D3 2,000 unit at breakfast Patient declined Trulicity 1.57 MG last month due to supply on hand. Patient is due for next adherence delivery on: 06-02-2020. Called patient and reviewed medications and coordinated delivery.  This delivery to include: . Atorvastatin 20 mg: one tablet at breakfast . Citalopram 10 mg: one tablet at breakfast . Lisinopril 5 mg: one tablet at breakfast . Metformin 1000 mg: one tablet at breakfast and on at dinner . Vitamin D3 2,000 unit at breakfast  Patient needs refills for Atorvastatin 20 mg. Confirmed delivery date of 06-02-2020, advised patient that pharmacy will contact them the morning of delivery.  Follow-Up:  Coordination of Enhanced Pharmacy Services   Jonathan (Jonathan) Mare Calhoun, [place] Assistant [phone]

## 2020-05-26 MED ORDER — ONETOUCH ULTRASOFT LANCETS MISC: 3 refills | Status: DC

## 2020-05-26 MED ORDER — ONETOUCH VERIO VI STRP: ORAL_STRIP | 3 refills | Status: DC

## 2020-05-26 MED ORDER — ONETOUCH VERIO W/DEVICE KIT: PACK | 0 refills | Status: AC

## 2020-05-26 NOTE — Telephone Encounter (Signed)
-----   Message from Verner Chol, [facility] sent at 05/25/2020 12:41 PM EST ----- Regarding: DM testing supplies Hi,  Mr. Aeneas Longsworth requested a Onetouch Verio meter, lancets and test strips prescriptions. Can you please sent to [facility]?  Thank you, Maddie

## 2020-05-26 NOTE — Telephone Encounter (Signed)
Sent to the pharmacy by e-scribe. 

## 2020-05-27 ENCOUNTER — Other Ambulatory Visit: Payer: Self-pay | Admitting: Family Medicine

## 2020-05-27 MED ORDER — ATORVASTATIN CALCIUM 20 MG PO TABS: 20.0000 mg | ORAL_TABLET | Freq: Every day | ORAL | 3 refills | Status: DC

## 2020-05-27 NOTE — Telephone Encounter (Signed)
Sent to the pharmacy by e-scribe. 

## 2020-05-31 ENCOUNTER — Ambulatory Visit: Payer: Medicare Other

## 2020-06-02 ENCOUNTER — Ambulatory Visit: Payer: PPO | Admitting: Pharmacist

## 2020-06-02 ENCOUNTER — Other Ambulatory Visit: Payer: Self-pay

## 2020-06-02 DIAGNOSIS — E1169 Type 2 diabetes mellitus with other specified complication: Secondary | ICD-10-CM

## 2020-06-02 DIAGNOSIS — I10 Essential (primary) hypertension: Secondary | ICD-10-CM

## 2020-06-02 NOTE — Patient Instructions (Addendum)
Hi [place],  It was so lovely to get to meet you in person! I want to encourage you to keep up the good work with taking care of yourself and making small sustainable lifestyle changes. If you can start exercising more regularly, I think you will feel better overall and will notice some lowering with blood sugars, cholesterol, and blood pressure. I added some information about trying to lowering your triglycerides through diet.  I also suggested trying Glucerna, which is a protein shake specifically for patients with diabetes. It may be helpful for curbing your appetite at dinner time and keeping you fuller longer without adding too many carbs to your day.  Don't forget to look into getting your shingles vaccine at the pharmacy!  Please give me a call if you have any questions or need anything before our follow up!  Best, Maddie  Gaylord ShihMadeline Bodin Gorka, PharmD [facility] ChildrenBCACP Clinical Pharmacist [facility] [phone]   Visit Information  Goals Addressed            This Visit's Progress   . Chronic Care Management       CARE PLAN ENTRY (see longitudinal plan of care for additional care plan information)  Current Barriers:  . Chronic Disease Management support, education, and care coordination needs related to Hypertension, Hyperlipidemia, Diabetes, Osteoarthritis and Allergic Rhinitis    Hypertension BP Readings from Last 3 Encounters:  03/17/20 120/80  10/07/19 120/72  04/30/19 131/76   . Pharmacist Clinical Goal(s): o Over the next 120 days, patient will work with PharmD and providers to maintain BP goal <130/80 . Current regimen:  o Lisinopril 5 mg daily . Interventions: o Discussed low salt diet and exercising as tolerated extensively . Patient self care activities - Over the next 120 days, patient will: o Check blood pressure 1-2 times weekly, document, and provide at future appointments o Ensure daily salt intake < 2300 mg/day  Hyperlipidemia Lab Results   Component Value Date/Time   LDLCALC 70 03/17/2020 08:17 AM   . Pharmacist Clinical Goal(s): o Over the next 90 days, patient will work with PharmD and providers to maintain LDL goal < 70 . Current regimen:  o Atorvastatin 20 mg daily . Interventions: o Discussed how triglycerides can increase when: . Eating eat too much food . Eating high-fat foods such as fried foods, red meat, chicken skin, egg yolks, high-fat dairy, butter, lard, shortening, margarine, and fast food . Eating foods high in simple carbohydrates such as fresh and canned fruit, candy, ice cream and sweetened yogurt, sweetened drinks like juices, cereal, jams, foods and drinks with corn syrup, or sugar listed as the first ingredient . Drinking alcohol   o Discussed recommendations for moderate aerobic exercise for 150 minutes/week spread out over 5 days for heart healthy lifestyle . Patient self care activities - Over the next 120 days, patient will: o Continue current medication o Try drinking a protein shake (Glucerna) during the middle of the day to avoid overeating at dinner  Diabetes Lab Results  Component Value Date/Time   HGBA1C 6.3 (H) 03/17/2020 08:17 AM   HGBA1C 5.9 10/07/2019 07:11 AM   HGBA1C 6.3 02/27/2019 07:43 AM   HGBA1C 5.9 05/30/2018 09:29 AM   . Pharmacist Clinical Goal(s): o Over the next 120 days, patient will work with PharmD and providers to maintain A1c goal <7% . Current regimen:  . Trulicity 0.75 mg inject once weekly . Metformin 1000 mg twice daily  . Interventions: o Discussed carbohydrate counting and exercising as  tolerated extensively o Discussed blood sugar monitoring and rotating through different times . Patient self care activities - Over the next 120 days, patient will: o Check blood sugar once daily, document, and provide at future appointments o Contact provider with any episodes of hypoglycemia  Medication management . Pharmacist Clinical Goal(s): o Over the next 120  days, patient will work with PharmD and providers to maintain optimal medication adherence . Current pharmacy: [facility] . Interventions o Comprehensive medication review performed. o Utilize [facility] for medication synchronization, packaging and delivery . Patient self care activities - Over the next 120 days, patient will: o Take medications as prescribed o Report any questions or concerns to PharmD and/or provider(s)       The patient verbalized understanding of instructions, educational materials, and care plan provided today and declined offer to receive copy of patient instructions, educational materials, and care plan.   Face to Face appointment with pharmacist scheduled for:  4-5 months  Viona Gilmore, [facility]  High Triglycerides Eating Plan Triglycerides are a type of fat in the blood. High levels of triglycerides can increase your risk of heart disease and stroke. If your triglyceride levels are high, choosing the right foods can help lower your triglycerides and keep your heart healthy. Work with your health care provider or a diet and nutrition specialist (dietitian) to develop an eating plan that is right for you. What are tips for following this plan? General guidelines  Lose weight, if you are overweight. For most people, losing 5-10 lbs (2-5 kg) helps lower triglyceride levels. A weight-loss plan may include. ? 30 minutes of exercise at least 5 days a week. ? Reducing the amount of calories, sugar, and fat you eat.  Eat a wide variety of fresh fruits, vegetables, and whole grains. These foods are high in fiber.  Eat foods that contain healthy fats, such as fatty fish, nuts, seeds, and olive oil.  Avoid foods that are high in added sugar, added salt (sodium), saturated fat, and trans fat.  Avoid low-fiber, refined carbohydrates such as white bread, crackers, noodles, and white rice.  Avoid foods with partially hydrogenated oils (trans fats), such as  fried foods or stick margarine.  Limit alcohol intake to no more than 1 drink a day for nonpregnant women and 2 drinks a day for men. One drink equals 12 oz of beer, 5 oz of wine, or 1 oz of hard liquor. Your health care provider may recommend that you drink less depending on your overall health.   Reading food labels  Check food labels for the amount of saturated fat. Choose foods with no or very little saturated fat.  Check food labels for the amount of trans fat. Choose foods with no trans fat.  Check food labels for the amount of cholesterol. Choose foods low in cholesterol. Ask your dietitian how much cholesterol you should have each day.  Check food labels for the amount of sodium. Choose foods with less than 140 milligrams (mg) per serving. Shopping  Buy dairy products labeled as nonfat (skim) or low-fat (1%).  Avoid buying processed or prepackaged foods. These are often high in added sugar, sodium, and fat. Cooking  Choose healthy fats when cooking, such as olive oil or canola oil.  Cook foods using lower fat methods, such as baking, broiling, boiling, or grilling.  Make your own sauces, dressings, and marinades when possible, instead of buying them. Store-bought sauces, dressings, and marinades are often high in sodium and sugar.  Meal planning  Eat more home-cooked food and less restaurant, buffet, and fast food.  Eat fatty fish at least 2 times each week. Examples of fatty fish include salmon, trout, mackerel, tuna, and herring.  If you eat whole eggs, do not eat more than 3 egg yolks per week. What foods are recommended? The items listed may not be a complete list. Talk with your dietitian about what dietary choices are best for you. Grains Whole wheat or whole grain breads, crackers, cereals, and pasta. Unsweetened oatmeal. Bulgur. Barley. Quinoa. Brown rice. Whole wheat flour tortillas. Vegetables Fresh or frozen vegetables. Low-sodium canned vegetables. Fruits All  fresh, canned (in natural juice), or frozen fruits. Meats and other protein foods Skinless chicken or [place]. Ground chicken or [place]. Lean cuts of pork, trimmed of fat. Fish and seafood, especially salmon, trout, and herring. Egg whites. Dried beans, peas, or lentils. Unsalted nuts or seeds. Unsalted canned beans. Natural peanut or almond butter. Dairy Low-fat dairy products. Skim or low-fat (1%) milk. Reduced fat (2%) and low-sodium cheese. Low-fat ricotta cheese. Low-fat cottage cheese. Plain, low-fat yogurt. Fats and oils Tub margarine without trans fats. Light or reduced-fat mayonnaise. Light or reduced-fat salad dressings. Avocado. Safflower, olive, sunflower, soybean, and canola oils. What foods are not recommended? The items listed may not be a complete list. Talk with your dietitian about what dietary choices are best for you. Grains White bread. White (regular) pasta. White rice. Cornbread. Bagels. Pastries. Crackers that contain trans fat. Vegetables Creamed or fried vegetables. Vegetables in a cheese sauce. Fruits Sweetened dried fruit. Canned fruit in syrup. Fruit juice. Meats and other protein foods Fatty cuts of meat. Ribs. Chicken wings. Berniece Salines. Sausage. Bologna. Salami. Chitterlings. Fatback. Hot dogs. Bratwurst. Packaged lunch meats. Dairy Whole or reduced-fat (2%) milk. Half-and-half. Cream cheese. Full-fat or sweetened yogurt. Full-fat cheese. Nondairy creamers. Whipped toppings. Processed cheese or cheese spreads. Cheese curds. Beverages Alcohol. Sweetened drinks, such as soda, lemonade, fruit drinks, or punches. Fats and oils Butter. Stick margarine. Lard. Shortening. Ghee. Bacon fat. Tropical oils, such as coconut, palm kernel, or palm oils. Sweets and desserts Corn syrup. Sugars. Honey. Molasses. Candy. Jam and jelly. Syrup. Sweetened cereals. Cookies. Pies. Cakes. Donuts. Muffins. Ice cream. Condiments Store-bought sauces, dressings, and marinades that are high in  sugar, such as ketchup and barbecue sauce. Summary  High levels of triglycerides can increase the risk of heart disease and stroke. Choosing the right foods can help lower your triglycerides.  Eat plenty of fresh fruits, vegetables, and whole grains. Choose low-fat dairy and lean meats. Eat fatty fish at least twice a week.  Avoid processed and prepackaged foods with added sugar, sodium, saturated fat, and trans fat.  If you need suggestions or have questions about what types of food are good for you, talk with your health care provider or a dietitian. This information is not intended to replace advice given to you by your health care provider. Make sure you discuss any questions you have with your health care provider. Document Revised: 09/10/2019 Document Reviewed: 09/10/2019 Elsevier Patient Education  2021 [employer].

## 2020-06-02 NOTE — Chronic Care Management (AMB) (Signed)
Chronic Care Management Pharmacy  Name: Jonathan Calhoun  MRN: [id number] DOB: 1950-12-03   Chief Complaint/ HPI  Jonathan Calhoun,  70 y.o. , male presents for their Follow-Up CCM visit with the clinical pharmacist In office.  PCP : [name], NP Patient Care Team: [name], NP as PCP - General (Family Medicine) Ortho, [facility] (Orthopedic Surgery) Marlyce Huge, DDS as Consulting Physician (Dentistry) Viona Gilmore, [facility] as Pharmacist (Pharmacist)  Their chronic conditions include: Hypertension, Hyperlipidemia, Diabetes, Osteoarthritis and Allergic Rhinitis   Office Visits: 05/04/20 Ofilia Neas, LPN: Patient presented for medicare annual wellness visit. A1c increased to 6.3%.   03/17/20 [name], NP: Patient presented for annual exam. TGs increased significantly. Influenza vaccine administered.  03/09/20 [name], NP: Patient presented for shingles. Prescribed Valtrex and prednisolone eye drops.  02/11/20: CPP Visit 10/07/19: Patient presented to [name], NP for follow-up. A1c improved to 5.9%. Benzonatate and doxycyline stopped.   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   No Known Allergies  Medications: Outpatient Encounter Medications as of 06/02/2020  Medication Sig  . atorvastatin (LIPITOR) 20 MG tablet Take 1 tablet (20 mg total) by mouth daily.  . blood glucose meter kit and supplies KIT Dispense based on patient and insurance preference. Use up to four times daily as directed. (FOR ICD-9 250.00, 250.01).  . Blood Glucose Monitoring Suppl (ONETOUCH VERIO) w/Device KIT Use to test three times daily.  . Cholecalciferol (VITAMIN D3) 50 MCG (2000 UT) TABS Take 2,000 Units by mouth daily.  . citalopram (CELEXA) 10 MG tablet Take 1 tablet (10 mg total) by mouth daily.  . Dulaglutide (TRULICITY) 7.82 NF/6.2ZH SOPN Inject 0.75 mg into the skin once a week.  [name] Kitchen glucose blood (ONETOUCH VERIO) test strip Use to  test three times daily.  . Lancets (ONETOUCH ULTRASOFT) lancets Use to test three times daily.  [name] Kitchen lisinopril (ZESTRIL) 5 MG tablet TAKE ONE TABLET BY MOUTH EVERY MORNING  . metFORMIN (GLUCOPHAGE) 1000 MG tablet TAKE ONE TABLET BY MOUTH EVERY MORNING and TAKE ONE TABLET BY MOUTH EVERY EVENING  . prednisoLONE acetate (PRED FORTE) 1 % ophthalmic suspension Place 1 drop into the right eye 4 (four) times daily. X 7 days (Patient not taking: Reported on 05/04/2020)   No facility-administered encounter medications on file as of 06/02/2020.    Wt Readings from Last 3 Encounters:  03/17/20 245 lb (111.1 kg)  10/07/19 240 lb (108.9 kg)  04/30/19 253 lb (114.8 kg)    Current Diagnosis/Assessment:    Goals Addressed            This Visit's Progress   . Chronic Care Management       CARE PLAN ENTRY (see longitudinal plan of care for additional care plan information)  Current Barriers:  . Chronic Disease Management support, education, and care coordination needs related to Hypertension, Hyperlipidemia, Diabetes, Osteoarthritis and Allergic Rhinitis    Hypertension BP Readings from Last 3 Encounters:  03/17/20 120/80  10/07/19 120/72  04/30/19 131/76   . Pharmacist Clinical Goal(s): o Over the next 120 days, patient will work with PharmD and providers to maintain BP goal <130/80 . Current regimen:  o Lisinopril 5 mg daily . Interventions: o Discussed low salt diet and exercising as tolerated extensively . Patient self care activities - Over the next 120 days, patient will: o Check blood pressure 1-2 times weekly, document, and provide at future appointments o Ensure daily salt intake < 2300 mg/day  Hyperlipidemia Lab Results  Component Value Date/Time   LDLCALC 70 03/17/2020 08:17 AM   . Pharmacist Clinical Goal(s): o Over the next 90 days, patient will work with PharmD and providers to maintain LDL goal < 70 . Current regimen:  o Atorvastatin 20 mg  daily . Interventions: o Discussed how triglycerides can increase when: . Eating eat too much food . Eating high-fat foods such as fried foods, red meat, chicken skin, egg yolks, high-fat dairy, butter, lard, shortening, margarine, and fast food . Eating foods high in simple carbohydrates such as fresh and canned fruit, candy, ice cream and sweetened yogurt, sweetened drinks like juices, cereal, jams, foods and drinks with corn syrup, or sugar listed as the first ingredient . Drinking alcohol   o Discussed recommendations for moderate aerobic exercise for 150 minutes/week spread out over 5 days for heart healthy lifestyle . Patient self care activities - Over the next 120 days, patient will: o Continue current medication o Try drinking a protein shake (Glucerna) during the middle of the day to avoid overeating at dinner  Diabetes Lab Results  Component Value Date/Time   HGBA1C 6.3 (H) 03/17/2020 08:17 AM   HGBA1C 5.9 10/07/2019 07:11 AM   HGBA1C 6.3 02/27/2019 07:43 AM   HGBA1C 5.9 05/30/2018 09:29 AM   . Pharmacist Clinical Goal(s): o Over the next 120 days, patient will work with PharmD and providers to maintain A1c goal <7% . Current regimen:  . Trulicity 4.48 mg inject once weekly . Metformin 1000 mg twice daily  . Interventions: o Discussed carbohydrate counting and exercising as tolerated extensively o Discussed blood sugar monitoring and rotating through different times . Patient self care activities - Over the next 120 days, patient will: o Check blood sugar once daily, document, and provide at future appointments o Contact provider with any episodes of hypoglycemia  Medication management . Pharmacist Clinical Goal(s): o Over the next 120 days, patient will work with PharmD and providers to maintain optimal medication adherence . Current pharmacy: [facility] . Interventions o Comprehensive medication review performed. o Utilize [facility] pharmacy for medication  synchronization, packaging and delivery . Patient self care activities - Over the next 120 days, patient will: o Take medications as prescribed o Report any questions or concerns to PharmD and/or provider(s)      Diabetes   A1c goal <7%  Recent Relevant Labs: Lab Results  Component Value Date/Time   HGBA1C 6.3 (H) 03/17/2020 08:17 AM   HGBA1C 5.9 10/07/2019 07:11 AM   HGBA1C 6.3 02/27/2019 07:43 AM   HGBA1C 5.9 05/30/2018 09:29 AM   GFR 103.17 02/27/2019 07:43 AM   GFR 95.30 10/09/2017 08:01 AM   MICROALBUR 0.7 01/25/2016 08:57 AM   MICROALBUR 1.7 09/14/2015 08:42 AM    Last diabetic Eye exam:  Lab Results  Component Value Date/Time   HMDIABEYEEXA No Retinopathy 05/23/2018 12:00 AM    Last diabetic Foot exam: No results found for: HMDIABFOOTEX   Checking BG: Never  Recent FBG Readings: n/a Recent pre-meal BG readings: n/a Recent 2hr PP BG readings:  n/a Recent HS BG readings: n/a  Patient has failed these meds in past: n/a Patient is currently controlled on the following medications: . Trulicity 1.85 mg inject weekly  . Metformin 1000 mg twice daily   We discussed: diet and exercise extensively. -Patient reports he is tolerating Trulicity and has helped some with curbing his appetite; he only had some diarrhea initially -Recommended for him to send over documentation after his next  eye exam to stay up-to-date   Plan Patient will start checking BGs once daily at home and recommended rotating through times (before breakfast, 2 hours post meal, and before bedtime) and keeping a log to bring to PCP appointment.   Hypertension   BP goal is:  <130/80  Office blood pressures are  BP Readings from Last 3 Encounters:  03/17/20 120/80  10/07/19 120/72  04/30/19 131/76   Patient checks BP at home several times per month Patient home BP readings are ranging: 130/80-85s  Patient has failed these meds in the past: n/a Patient is currently controlled on the following  medications:  . Lisinopril 5 mg daily   We discussed diet and exercise extensively  Plan  Continue current medications   Hyperlipidemia   LDL goal < 70  Lipid Panel     Component Value Date/Time   CHOL 137 03/17/2020 0817   TRIG 215 (H) 03/17/2020 0817   HDL 39 (L) 03/17/2020 0817   LDLCALC 70 03/17/2020 0817    Hepatic Function Latest Ref Rng & Units 03/17/2020 02/27/2019 10/09/2017  Total Protein 6.1 - 8.1 g/dL 6.8 7.1 7.1  Albumin 3.5 - 5.2 g/dL - 4.4 4.4  AST 10 - 35 U/L $Remo'17 19 18  'Uhcjd$ ALT 9 - 46 U/L $Remo'19 20 21  'urlYs$ Alk Phosphatase 39 - 117 U/L - 83 90  Total Bilirubin 0.2 - 1.2 mg/dL 0.6 1.1 1.2  Bilirubin, Direct 0.0 - 0.3 mg/dL - - 0.3     The ASCVD Risk score (Herkimer., et al., 2013) failed to calculate for the following reasons:   The systolic blood pressure is missing   Patient has failed these meds in past: n/a Patient is currently controlled on the following medications:  . Atorvastatin 20 mg daily   We discussed:  diet and exercise extensively  -Discussed how triglycerides can increase when: . Eating eat too much food . Eating high-fat foods such as fried foods, red meat, chicken skin, egg yolks, high-fat dairy, butter, lard, shortening, margarine, and fast food . Eating foods high in simple carbohydrates such as fresh and canned fruit, candy, ice cream and sweetened yogurt, sweetened drinks like juices, cereal, jams, foods and drinks with corn syrup, or sugar listed as the first ingredient . Drinking alcohol  -Discussed recommendations for moderate aerobic exercise for 150 minutes/week spread out over 5 days for heart healthy lifestyle -Patient usually only eats 2 meals per day and overeats at dinner - recommended trying a protein shake such as Glucerna to help curb appetite at dinner  Plan  Continue current medications  Depression / Anxiety   PHQ9 Score:  PHQ9 SCORE ONLY 05/04/2020 03/17/2020 03/11/2019  PHQ-9 Total Score 0 1 0   GAD7 Score: No flowsheet  data found.  Patient has failed these meds in past: n/a Patient is currently controlled on the following medications:  . Citalopram 10 mg daily (started 1999)   We discussed:  Patient has been taking 10 mg daily for ~2 weeks and reports no changes in mood.   Plan Continue current medication.  Bone Health   Last DEXA Scan: n/a   T-Score femoral neck: n/a  T-Score total hip: n/a  T-Score lumbar spine: n/a  T-Score forearm radius: n/a  10-year probability of major osteoporotic fracture: n/a  10-year probability of hip fracture: n/a  Vit D, 25-Hydroxy  Date Value Ref Range Status  03/17/2020 39 30 - 100 ng/mL Final    Comment:    Vitamin D Status  25-OH Vitamin D: . Deficiency:                    <20 ng/mL Insufficiency:             20 - 29 ng/mL Optimal:                 > or = 30 ng/mL . For 25-OH Vitamin D testing on patients on  D2-supplementation and patients for whom quantitation  of D2 and D3 fractions is required, the QuestAssureD(TM) 25-OH VIT D, (D2,D3), LC/MS/MS is recommended: order  code [id number] (patients >74yrs). See Note 1 . Note 1 . For additional information, please refer to  http://education.Quest[facility].com/faq/FAQ199  (This link is being provided for informational/ educational purposes only.)      Patient is not a candidate for pharmacologic treatment  Patient has failed these meds in past: n/a Patient is currently uncontrolled on the following medications:  [name] Kitchen Vitamin D 2000 units daily  We discussed:  Recommend [phone] units of vitamin D daily.   Plan  Continue current medication.  Vaccines   Reviewed and discussed patient's vaccination history.    Immunization History  Administered Date(s) Administered  . Fluad Quad(high Dose 65+) 02/27/2019, 03/17/2020  . Influenza Split 02/14/2011, 01/29/2012  . Influenza Whole 05/22/2006  . Influenza, High Dose Seasonal PF 05/30/2018  . Influenza,inj,Quad PF,6+ Mos 02/24/2013, 02/12/2014   . PFIZER SARS-COV-2 Vaccination 07/06/2019, 07/29/2019  . Pneumococcal Conjugate-13 02/12/2014  . Pneumococcal Polysaccharide-23 05/22/2000, 05/03/2007, 10/24/2016  . Td 05/22/1997  . Tdap 01/29/2012   Patient plans to get his COVID booster this week.  Plan  Recommended patient receive shingles vaccine at pharmacy.    Medication Management   Patient's preferred pharmacy is:  [facility] - [place], [place] - [address] [place] [address] Phone: [phone] Fax: [phone]  Uses pill box? No - adherence packaging Pt endorses 99% compliance  We discussed: Discussed benefits of medication synchronization, packaging and delivery as well as enhanced pharmacist oversight with [facility].  Plan  Utilize [facility] pharmacy for medication synchronization, packaging and delivery   Follow up: 4-5 month office visit  Jeni Salles, PharmD [place] Pharmacist [place] [facility]

## 2020-06-08 DIAGNOSIS — H2512 Age-related nuclear cataract, left eye: Secondary | ICD-10-CM | POA: Diagnosis not present

## 2020-06-11 DIAGNOSIS — H25812 Combined forms of age-related cataract, left eye: Secondary | ICD-10-CM | POA: Diagnosis not present

## 2020-06-14 ENCOUNTER — Other Ambulatory Visit: Payer: Self-pay | Admitting: Adult Health

## 2020-06-15 NOTE — Telephone Encounter (Signed)
Left a message for a  Return call.  Pt is currently scheduled for follow up on 07/16/20 but is due now.  Left a message for a return call.  Need to reschedule the pt.

## 2020-06-22 ENCOUNTER — Telehealth: Payer: Self-pay | Admitting: Pharmacist

## 2020-06-22 NOTE — Chronic Care Management (AMB) (Signed)
Chronic Care Management Pharmacy Assistant   Name: Jonathan Calhoun  MRN: [id number] DOB: July 21, 1950  Reason for Encounter: Medication Review  PCP : [name], NP  Allergies:  No Known Allergies  Medications: Outpatient Encounter Medications as of 06/22/2020  Medication Sig  . atorvastatin (LIPITOR) 20 MG tablet Take 1 tablet (20 mg total) by mouth daily.  . blood glucose meter kit and supplies KIT Dispense based on patient and insurance preference. Use up to four times daily as directed. (FOR ICD-9 250.00, 250.01).  . Blood Glucose Monitoring Suppl (ONETOUCH VERIO) w/Device KIT Use to test three times daily.  . Cholecalciferol (VITAMIN D3) 50 MCG (2000 UT) TABS Take 2,000 Units by mouth daily.  . citalopram (CELEXA) 10 MG tablet Take 1 tablet (10 mg total) by mouth daily.  [name] Kitchen glucose blood (ONETOUCH VERIO) test strip Use to test three times daily.  . Lancets (ONETOUCH ULTRASOFT) lancets Use to test three times daily.  [name] Kitchen lisinopril (ZESTRIL) 5 MG tablet TAKE ONE TABLET BY MOUTH EVERY MORNING  . metFORMIN (GLUCOPHAGE) 1000 MG tablet TAKE ONE TABLET BY MOUTH EVERY MORNING and TAKE ONE TABLET BY MOUTH EVERY EVENING  . prednisoLONE acetate (PRED FORTE) 1 % ophthalmic suspension Place 1 drop into the right eye 4 (four) times daily. X 7 days (Patient not taking: Reported on 05/04/2020)  . TRULICITY 4.01 UU/7.2ZD SOPN Inject 0.75 mg into the skin once a week.   No facility-administered encounter medications on file as of 06/22/2020.    Current Diagnosis: Patient Active Problem List   Diagnosis Date Noted  . Diabetes ([place]) 09/28/2015  . Other cyst of bone, right lower leg 02/24/2013  . Osteoarthritis of right hip 03/30/2011  . AUTONOMIC NEUROPATHY, DIABETIC 04/29/2010  . [place] DISEASE, LUMBAR 05/04/2007  . Hyperlipidemia 02/13/2007  . ERECTILE DYSFUNCTION 02/13/2007  . Essential hypertension 02/13/2007  . ALLERGIC RHINITIS 02/13/2007    Goals Addressed   None    Reviewed  chart for medication changes ahead of medication coordination call. No OVs, Consults, or hospital visits since last care coordination call/Pharmacist visit. No medication changes indicated .  BP Readings from Last 3 Encounters:  03/17/20 120/80  10/07/19 120/72  04/30/19 131/76    Lab Results  Component Value Date   HGBA1C 6.3 (H) 03/17/2020     Patient obtains medications through Adherence Packaging  30 Days  Last adherence delivery included:  . Atorvastatin 20 mg: one tablet at breakfast . Citalopram 10 mg: one tablet at breakfast . Lisinopril 5 mg: one tablet at breakfast . Metformin 1000 mg: one tablet at breakfast and on at dinner . Vitamin D3 2,000 unit at breakfast Patient declined the following medication last month due additional supply on hand. . Trulicity 6.64 MG- Inject into skin weekly . OneTouch Ultrasoft lancets: use to test three times a daily . OneTouch Verio test strips: use to test three times a daily  Patient is due for next adherence delivery on: 06-28-2020. Called patient and reviewed medications and coordinated delivery. This delivery to include: . Atorvastatin 20 mg: one tablet at breakfast . Citalopram 10 mg: one tablet at breakfast . Lisinopril 5 mg: one tablet at breakfast . Metformin 1000 mg: one tablet at breakfast and on at dinner . Vitamin D3 2,000 unit at breakfast  Patient declined the following medications the following medication due to supply on hand. . Trulicity 4.03 MG- Inject into skin weekly . OneTouch Ultrasoft lancets: use to test three times a daily . OneTouch Verio  test strips: use to test three times a daily  The patient doe not  needs refills. Confirmed delivery date of 06-28-2020, advised patient that pharmacy will contact them the morning of delivery.  Follow-Up:  Care Coordination with Outside Provider and Pharmacist Review  Maia Breslow, [place] Assistant [phone]

## 2020-06-30 ENCOUNTER — Encounter: Payer: Self-pay | Admitting: Adult Health

## 2020-06-30 ENCOUNTER — Other Ambulatory Visit: Payer: Self-pay

## 2020-06-30 ENCOUNTER — Ambulatory Visit (INDEPENDENT_AMBULATORY_CARE_PROVIDER_SITE_OTHER): Payer: PPO | Admitting: Adult Health

## 2020-06-30 VITALS — BP 128/80 | Temp 98.3°F | Wt 241.0 lb

## 2020-06-30 DIAGNOSIS — I10 Essential (primary) hypertension: Secondary | ICD-10-CM | POA: Diagnosis not present

## 2020-06-30 DIAGNOSIS — E1169 Type 2 diabetes mellitus with other specified complication: Secondary | ICD-10-CM

## 2020-06-30 LAB — POCT GLYCOSYLATED HEMOGLOBIN (HGB A1C): HbA1c, POC (controlled diabetic range): 6.4 % (ref 0.0–7.0)

## 2020-06-30 MED ORDER — TRULICITY 0.75 MG/0.5ML ~~LOC~~ SOAJ: 0.7500 mg | SUBCUTANEOUS | 0 refills | Status: DC

## 2020-06-30 NOTE — Progress Notes (Signed)
Subjective:    Patient ID: Jonathan Calhoun, male    DOB: [birth year], 70 y.o.   MRN: [id number]  HPI 70 year old male who  has a past medical history of Allergy, Anxiety, Depression, Diabetes mellitus, History of alcoholism ([place]), History of shingles, Hyperlipidemia, Hypertension, and Ileus, postoperative ([place]).  He presents to the office today for 15-month follow-up regarding diabetes and hypertension. His diabetes is maintained on Metformin 1000 mg twice daily and Trulicity 4.94 mg weekly. He denies any hypoglycemic events and has been monitoring his blood sugars at home with readings consistently in the 115-135.   Lab Results  Component Value Date   HGBA1C 6.3 (H) 03/17/2020   For blood pressure control he takes lisinopril 5 mg daily. He denies dizziness, lightheadedness, chest pain, or shortness of breath. BP Readings from Last 3 Encounters:  06/30/20 128/80  03/17/20 120/80  10/07/19 120/72   He denies any acute complaints    Review of Systems  All other systems reviewed and are negative. See HPI   Past Medical History:  Diagnosis Date  . Allergy   . Anxiety   . Depression   . Diabetes mellitus    ORAL MEDS-NO INSULIN  . History of alcoholism ([place])   . History of shingles   . Hyperlipidemia   . Hypertension   . Ileus, postoperative ([place])    Post-op Chole Surgery    Social History   Socioeconomic History  . Marital status: Married    Spouse name: Not on file  . Number of children: Not on file  . Years of education: Not on file  . Highest education level: Not on file  Occupational History  . Not on file  Tobacco Use  . Smoking status: Current Every Day Smoker    Packs/day: 0.00    Types: Cigars  . Smokeless tobacco: Never Used  Vaping Use  . Vaping Use: Never used  Substance and Sexual Activity  . Alcohol use: No    Comment: quit 3 yrs ago  . Drug use: No  . Sexual activity: Not on file  Other Topics Concern  . Not on file  Social History Narrative    Married, lives with spouse.   Plans for home after surgery.   Living will, healthcare POA.   Social Determinants of Health   Financial Resource Strain: Low Risk   . Difficulty of Paying Living Expenses: Not hard at all  Food Insecurity: No Food Insecurity  . Worried About Charity fundraiser in the Last Year: Never true  . Ran Out of Food in the Last Year: Never true  Transportation Needs: No Transportation Needs  . Lack of Transportation (Medical): No  . Lack of Transportation (Non-Medical): No  Physical Activity: Inactive  . Days of Exercise per Week: 0 days  . Minutes of Exercise per Session: 0 min  Stress: No Stress Concern Present  . Feeling of Stress : Not at all  Social Connections: Socially Integrated  . Frequency of Communication with Friends and Family: More than three times a week  . Frequency of Social Gatherings with Friends and Family: More than three times a week  . Attends Religious Services: More than 4 times per year  . Active Member of Clubs or Organizations: Yes  . Attends Archivist Meetings: More than 4 times per year  . Marital Status: Married  Human resources officer Violence: Not At Risk  . Fear of Current or Ex-Partner: No  . Emotionally Abused: No  .  Physically Abused: No  . Sexually Abused: No    Past Surgical History:  Procedure Laterality Date  . BONE EXOSTOSIS EXCISION  08/10/2011   Procedure: EXOSTOSIS EXCISION;  Surgeon: Marcheta Grammes, DPM;  Location: [facility];  Service: Orthopedics;  Laterality: Left;  Exostectomy of Calcaneous Left Foot  . CARDIAC CATHETERIZATION  2002  . CHOLECYSTECTOMY    . ENDOVENOUS ABLATION SAPHENOUS VEIN W/ LASER Left 01/01/2019   endovenous laser ablation left greater saphenous vein by Ruta Hinds MD   . OSTECTOMY Right 04/03/2018   Procedure: OSTECTOMY RIGHT CALCANEUS;  Surgeon: Caprice Beaver, DPM;  Location: [facility];  Service: Podiatry;  Laterality: Right;  . RIGHT SHOULDER SURGERY FOR TEAR AND  SPUR - YRS AGO    . SYNOVECTOMY  08/10/2011   Procedure: SYNOVECTOMY;  Surgeon: Marcheta Grammes, DPM;  Location: [facility];  Service: Orthopedics;  Laterality: Left;  Synovectomy of Peroneal Tendon Left Foot  . TENDON REPAIR  08/10/2011   Procedure: TENDON REPAIR;  Surgeon: Marcheta Grammes, DPM;  Location: [facility];  Service: Orthopedics;  Laterality: Left;  Repair of Peroneal Tendon Left Foot  . TENDON REPAIR Right 04/03/2018   Procedure: EXPLORATION OF PERONEAL TENDON WITH POSSIBLE REPAIR OF LONGITUDINAL TEAR OF THE PERONEAL TENDON RIGHT FOOT;  Surgeon: Caprice Beaver, DPM;  Location: [facility];  Service: Podiatry;  Laterality: Right;  . TONSILLECTOMY    . TOTAL HIP ARTHROPLASTY  03/29/2011   Procedure: TOTAL HIP ARTHROPLASTY;  Surgeon: Dione Plover Aluisio;  Location: [facility];  Service: Orthopedics;  Laterality: Right;    Family History  Problem Relation Age of Onset  . Depression Mother   . Cancer Mother        colon  . Heart failure Mother   . Colon cancer Mother   . Coronary artery disease Father   . Hypertension Father     No Known Allergies  Current Outpatient Medications on File Prior to Visit  Medication Sig Dispense Refill  . atorvastatin (LIPITOR) 20 MG tablet Take 1 tablet (20 mg total) by mouth daily. 90 tablet 3  . blood glucose meter kit and supplies KIT Dispense based on patient and insurance preference. Use up to four times daily as directed. (FOR ICD-9 250.00, 250.01). 1 each 0  . Blood Glucose Monitoring Suppl (ONETOUCH VERIO) w/Device KIT Use to test three times daily. 1 kit 0  . Cholecalciferol (VITAMIN D3) 50 MCG (2000 UT) TABS Take 2,000 Units by mouth daily.    . citalopram (CELEXA) 10 MG tablet Take 1 tablet (10 mg total) by mouth daily. 90 tablet 1  . glucose blood (ONETOUCH VERIO) test strip Use to test three times daily. 300 each 3  . Lancets (ONETOUCH ULTRASOFT) lancets Use to test three times daily. 300 each 3  . lisinopril (ZESTRIL) 5 MG tablet TAKE  ONE TABLET BY MOUTH EVERY MORNING 90 tablet 3  . metFORMIN (GLUCOPHAGE) 1000 MG tablet TAKE ONE TABLET BY MOUTH EVERY MORNING and TAKE ONE TABLET BY MOUTH EVERY EVENING 180 tablet 1  . prednisoLONE acetate (PRED FORTE) 1 % ophthalmic suspension Place 1 drop into the right eye 4 (four) times daily. X 7 days 5 mL 0  . TRULICITY 1.06 YI/9.4WN SOPN Inject 0.75 mg into the skin once a week. 2 mL 0   No current facility-administered medications on file prior to visit.    BP 128/80   Temp 98.3 F (36.8 C)   Wt 241 lb (109.3 kg) Comment: Pt Reported  BMI  30.94 kg/m        Objective:   Physical Exam Vitals and nursing note reviewed.  Constitutional:      Appearance: Normal appearance.  Cardiovascular:     Rate and Rhythm: Normal rate and regular rhythm.     Pulses: Normal pulses.     Heart sounds: Normal heart sounds.  Pulmonary:     Effort: Pulmonary effort is normal.     Breath sounds: Normal breath sounds.  Skin:    General: Skin is warm and dry.     Capillary Refill: Capillary refill takes less than 2 seconds.  Neurological:     General: No focal deficit present.     Mental Status: He is alert and oriented to person, place, and time.  Psychiatric:        Mood and Affect: Mood normal.        Behavior: Behavior normal.        Thought Content: Thought content normal.        Judgment: Judgment normal.       Assessment & Plan:  1. Type 2 diabetes mellitus with other specified complication, without long-term current use of insulin (HCC)  - POCT A1C- 6.4  - He is doing very well with Trulicity. Will see him back in 6 months or sooner if needed - Dulaglutide (TRULICITY) 0.27 OZ/3.6UY SOPN; Inject 0.75 mg into the skin once a week.  Dispense: 6 mL; Refill: 0  2. Essential hypertension - Continue with lisinopril 5 mg  - No change   [employer]

## 2020-07-16 ENCOUNTER — Telehealth: Payer: Self-pay | Admitting: Pharmacist

## 2020-07-16 ENCOUNTER — Ambulatory Visit: Payer: PPO | Admitting: Adult Health

## 2020-07-16 NOTE — Chronic Care Management (AMB) (Signed)
    Chronic Care Management Pharmacy Assistant   Name: Jonathan Calhoun  MRN: [id number] DOB: 1950/12/24  Reason for Encounter: Medication Review  Patient Questions:  1.  Have you seen any other providers since your last visit? No  2.  Any changes in your medicines or health? No   Received call from patient regarding medication management via [facility].  Patient requested an acute fill for Dulaglutide (TRULICITY) 2.29 NL/8.9 ML SOPN  to be delivered: 07-16-2020. Pharmacy needs refills? No  Confirmed delivery date of 07-16-2020, advised patient that pharmacy will contact them the morning of delivery. Follow-Up:  Coordination of Enhanced Pharmacy Services and Pharmacist Review   Maia Breslow, [place] Assistant [phone]

## 2020-07-20 ENCOUNTER — Telehealth: Payer: Self-pay | Admitting: Pharmacist

## 2020-07-20 NOTE — Chronic Care Management (AMB) (Addendum)
Chronic Care Management Pharmacy Assistant   Name: Jonathan Calhoun  MRN: [id number] DOB: [birth year]  Reason for Encounter: Medication Review  Patient Questions: 1.  Have you seen any other providers since your last visit?   06-30-20 Office Visit [name], NP Family Medicine  2.  Any changes in your medicines or health? No   PCP : [name], NP  Allergies:  No Known Allergies  Medications: Outpatient Encounter Medications as of 07/20/2020  Medication Sig   atorvastatin (LIPITOR) 20 MG tablet Take 1 tablet (20 mg total) by mouth daily.   blood glucose meter kit and supplies KIT Dispense based on patient and insurance preference. Use up to four times daily as directed. (FOR ICD-9 250.00, 250.01).   Blood Glucose Monitoring Suppl (ONETOUCH VERIO) w/Device KIT Use to test three times daily.   Cholecalciferol (VITAMIN D3) 50 MCG (2000 UT) TABS Take 2,000 Units by mouth daily.   citalopram (CELEXA) 10 MG tablet Take 1 tablet (10 mg total) by mouth daily.   Dulaglutide (TRULICITY) 7.50 NX/8.3FP SOPN Inject 0.75 mg into the skin once a week.   glucose blood (ONETOUCH VERIO) test strip Use to test three times daily.   Lancets (ONETOUCH ULTRASOFT) lancets Use to test three times daily.   lisinopril (ZESTRIL) 5 MG tablet TAKE ONE TABLET BY MOUTH EVERY MORNING   metFORMIN (GLUCOPHAGE) 1000 MG tablet TAKE ONE TABLET BY MOUTH EVERY MORNING and TAKE ONE TABLET BY MOUTH EVERY EVENING   prednisoLONE acetate (PRED FORTE) 1 % ophthalmic suspension Place 1 drop into the right eye 4 (four) times daily. X 7 days   No facility-administered encounter medications on file as of 07/20/2020.    Current Diagnosis: Patient Active Problem List   Diagnosis Date Noted   Diabetes ([place]) 09/28/2015   Other cyst of bone, right lower leg 02/24/2013   Osteoarthritis of right hip 03/30/2011   AUTONOMIC NEUROPATHY, DIABETIC 04/29/2010   [place] DISEASE, LUMBAR 05/04/2007   Hyperlipidemia 02/13/2007    ERECTILE DYSFUNCTION 02/13/2007   Essential hypertension 02/13/2007   ALLERGIC RHINITIS 02/13/2007    Goals Addressed   None    Reviewed chart for medication changes ahead of medication coordination call.  BP Readings from Last 3 Encounters:  06/30/20 128/80  03/17/20 120/80  10/07/19 120/72    Lab Results  Component Value Date   HGBA1C 6.4 06/30/2020     Patient obtains medications through Adherence Packaging  30 Days  Last adherence delivery included: Atorvastatin 20 mg: one tablet at breakfast Citalopram 10 mg: one tablet at breakfast Lisinopril 5 mg: one tablet at breakfast Metformin 1000 mg: one tablet at breakfast and one tablet at dinner Vitamin D3 2,000 unit one tablet at breakfast  Patient declined the following medication last month due to PRN use/additional supply on hand. Trulicity 8.25 MG- Inject into skin weekly OneTouch Ultrasoft lancets: use to test three times a daily OneTouch Verio test strips: use to test three times a daily  Patient is due for next adherence delivery on: 07/30/2020. Called patient and reviewed medications and coordinated delivery. This delivery to include: Atorvastatin 20 mg: one tablet at breakfast Citalopram 10 mg: one tablet at breakfast Lisinopril 5 mg: one tablet at breakfast Metformin 1000 mg: one tablet at breakfast and on at dinner Vitamin D3 2,000 unit at breakfast  Patient declined the following medications due to additional supply on hand. Trulicity 1.89 MG- Inject into skin weekly OneTouch Ultrasoft lancets: use to test three times a daily OneTouch  Verio test strips: use to test three times a daily   He currently does not need refills. Confirmed delivery date of 07/30/2020, advised patient that pharmacy will contact them the morning of delivery.  Follow-Up:  Coordination of Enhanced Pharmacy Services and Pharmacist Review  Maia Breslow, [place] Assistant [phone]

## 2020-08-19 ENCOUNTER — Other Ambulatory Visit: Payer: Self-pay | Admitting: Adult Health

## 2020-08-19 DIAGNOSIS — Z760 Encounter for issue of repeat prescription: Secondary | ICD-10-CM

## 2020-08-20 ENCOUNTER — Telehealth: Payer: Self-pay | Admitting: Pharmacist

## 2020-08-20 NOTE — Chronic Care Management (AMB) (Addendum)
Chronic Care Management Pharmacy Assistant   Name: Jonathan Calhoun  MRN: [id number] DOB: November 24, 1950  Reason for Encounter: Medication Review-Medication Coordination Call   Recent office visits:  None  Recent consult visits:  None  Hospital visits:  None in previous 6 months  Medications: Outpatient Encounter Medications as of 08/20/2020  Medication Sig   atorvastatin (LIPITOR) 20 MG tablet Take 1 tablet (20 mg total) by mouth daily.   blood glucose meter kit and supplies KIT Dispense based on patient and insurance preference. Use up to four times daily as directed. (FOR ICD-9 250.00, 250.01).   Blood Glucose Monitoring Suppl (ONETOUCH VERIO) w/Device KIT Use to test three times daily.   Cholecalciferol (VITAMIN D3) 50 MCG (2000 UT) TABS Take 2,000 Units by mouth daily.   citalopram (CELEXA) 10 MG tablet TAKE ONE TABLET BY MOUTH EVERY MORNING   Dulaglutide (TRULICITY) 0.56 PV/9.4IA SOPN Inject 0.75 mg into the skin once a week.   glucose blood (ONETOUCH VERIO) test strip Use to test three times daily.   Lancets (ONETOUCH ULTRASOFT) lancets Use to test three times daily.   lisinopril (ZESTRIL) 5 MG tablet TAKE ONE TABLET BY MOUTH EVERY MORNING   metFORMIN (GLUCOPHAGE) 1000 MG tablet TAKE ONE TABLET BY MOUTH EVERY MORNING and TAKE ONE TABLET BY MOUTH EVERY EVENING   prednisoLONE acetate (PRED FORTE) 1 % ophthalmic suspension Place 1 drop into the right eye 4 (four) times daily. X 7 days   No facility-administered encounter medications on file as of 08/20/2020.   Reviewed chart for medication changes ahead of medication coordination call.  BP Readings from Last 3 Encounters:  06/30/20 128/80  03/17/20 120/80  10/07/19 120/72    Lab Results  Component Value Date   HGBA1C 6.4 06/30/2020    Patient obtains medications through Adherence Packaging  30 Days  Last adherence delivery included:  Atorvastatin 20 mg: one tablet at breakfast Citalopram 10 mg: one tablet at  breakfast Lisinopril 5 mg: one tablet at breakfast Metformin 1000 mg: one tablet at breakfast and on at dinner Vitamin D3 2,000 unit at breakfast  Patient declined the following medication llast month due to additional supply on hand. Trulicity 1.65 MG- Inject into skin weekly ( last filled on 08/11/2020) OneTouch Ultrasoft lancets: use to test three times a daily OneTouch Verio test strips: use to test three times a daily  Patient is due for next adherence delivery on: 08/27/2020. Called patient and reviewed medications and coordinated delivery. This delivery to include: Atorvastatin 20 mg: one tablet at breakfast Citalopram 10 mg: one tablet at breakfast Lisinopril 5 mg: one tablet at breakfast Metformin 1000 mg: one tablet at breakfast and on at dinner Vitamin D3 2,000 unit at breakfast  I spoke with the patient and review medications. There are no changes in medications currently. Patient declined these medication this month due to PRN use/additional supply on hand.The patient is taking the following medications: Trulicity 5.37 MG- Inject into skin weekly ( last filled on 03.23.2022 for 30 ds) OneTouch Ultrasoft lancets: use to test three times a daily Last filled on 03.14.22 for 67 days OneTouch Verio test strips: use to test three times a daily  He is currently does not need refills. Confirmed delivery date of 08/27/2020, advised patient that pharmacy will contact them the morning of delivery.  Star Rating Drugs:  Dispensed Quantity Pharmacy  Atorvastatin 20 mg 03.11.2022 30 [facility]  Lisinopril 5 mg 03.11.2022 30 [facility]   Amilia ([place]ec[place]) Mare Ferrari, [place]  [phone]

## 2020-09-27 ENCOUNTER — Telehealth: Payer: Self-pay | Admitting: Pharmacist

## 2020-09-27 NOTE — Chronic Care Management (AMB) (Signed)
Chronic Care Management Pharmacy Assistant   Name: Jonathan Calhoun  MRN: [id number] DOB: 1951/02/06  Reason for Encounter: Medication Review-Medication Coordination Call   Recent office visits:  None  Recent consult visits:  None  Hospital visits:  None in previous 6 months  Medications: Outpatient Encounter Medications as of 09/27/2020  Medication Sig  . atorvastatin (LIPITOR) 20 MG tablet Take 1 tablet (20 mg total) by mouth daily.  . blood glucose meter kit and supplies KIT Dispense based on patient and insurance preference. Use up to four times daily as directed. (FOR ICD-9 250.00, 250.01).  . Blood Glucose Monitoring Suppl (ONETOUCH VERIO) w/Device KIT Use to test three times daily.  . Cholecalciferol (VITAMIN D3) 50 MCG (2000 UT) TABS Take 2,000 Units by mouth daily.  . citalopram (CELEXA) 10 MG tablet TAKE ONE TABLET BY MOUTH EVERY MORNING  . Dulaglutide (TRULICITY) 0.75 MG/0.5ML SOPN Inject 0.75 mg into the skin once a week.  [name] Kitchen glucose blood (ONETOUCH VERIO) test strip Use to test three times daily.  . Lancets (ONETOUCH ULTRASOFT) lancets Use to test three times daily.  [name] Kitchen lisinopril (ZESTRIL) 5 MG tablet TAKE ONE TABLET BY MOUTH EVERY MORNING  . metFORMIN (GLUCOPHAGE) 1000 MG tablet TAKE ONE TABLET BY MOUTH EVERY MORNING and TAKE ONE TABLET BY MOUTH EVERY EVENING  . prednisoLONE acetate (PRED FORTE) 1 % ophthalmic suspension Place 1 drop into the right eye 4 (four) times daily. X 7 days   No facility-administered encounter medications on file as of 09/27/2020.    Reviewed chart for medication changes ahead of medication coordination call. No OVs, Consults, or hospital visits since last care coordination call/Pharmacist visit.  No medication changes indicated OR if recent visit, treatment plan here.  BP Readings from Last 3 Encounters:  06/30/20 128/80  03/17/20 120/80  10/07/19 120/72    Lab Results  Component Value Date   HGBA1C 6.4 06/30/2020    Patient  obtains medications through Adherence Packaging  30 Days  Last adherence delivery included:  . Atorvastatin 20 mg: one tablet at breakfast . Citalopram 10 mg: one tablet at breakfast . Lisinopril 5 mg: one tablet at breakfast . Metformin 1000 mg: one tablet at breakfast and on at dinner . Vitamin D3 2,000 unit at breakfast  Patient declined the following medicaitons last month due to PRN use/additional supply on hand. . Trulicity 0.75 MG- Inject into skin weekly  . OneTouch Ultrasoft lancets: use to test three times a daily  . OneTouch Verio test strips: use to test three times a daily  Patient is due for next adherence delivery on: 09/30/2020. Called patient and reviewed medications and coordinated delivery. This delivery to include: . Atorvastatin 20 mg: one tablet at breakfast . Citalopram 10 mg: one tablet at breakfast . Lisinopril 5 mg: one tablet at breakfast . Metformin 1000 mg: one tablet at breakfast and on at dinner . Vitamin D3 2,000 unit at breakfast  Patient declined the following medicaitons last month due to PRN use/additional supply on hand. Letta Pate Ultrasoft lancets: use to test three times a daily  . OneTouch Verio test strips: use to test three times a daily  Coordinated acute fill for the following medication to be delivered 10/05/2020. . Trulicity 0.75 MG- Inject into skin weekly   He currently does not need refills.  Confirmed delivery date of 09/30/2020, advised patient that pharmacy will contact them the morning of delivery.  Star Rating Drugs:  Dispensed Quantity Pharmacy  Lisinopril 5 mg  04.06.2022 30 [facility]  Atorvastatin $RemoveBefor'20mg'[id number]$  04.06.2022 30 [facility]  Metformin 1,000 mg 04.06.2022 60 [facility]   Amilia Revonda Standard, [place] [phone]

## 2020-09-30 ENCOUNTER — Other Ambulatory Visit: Payer: Self-pay | Admitting: Adult Health

## 2020-09-30 DIAGNOSIS — E1169 Type 2 diabetes mellitus with other specified complication: Secondary | ICD-10-CM

## 2020-10-20 ENCOUNTER — Other Ambulatory Visit: Payer: Self-pay | Admitting: Adult Health

## 2020-10-20 DIAGNOSIS — E1169 Type 2 diabetes mellitus with other specified complication: Secondary | ICD-10-CM

## 2020-10-21 ENCOUNTER — Telehealth: Payer: Self-pay | Admitting: Pharmacist

## 2020-10-21 NOTE — Chronic Care Management (AMB) (Signed)
Date- Patient called to remind of appointment with Watt Climes on 10/22/2020 at 9:00 am  No answer, left message of appointment date, time and type of appointment ( in person). Left message to have all medications, supplements, blood pressure and/or blood sugar logs available during appointment and to return call if need to reschedule.   Star Rating Drug:  Dispensed Quantity Pharmacy  Lisinopril 5 mg  05.12.2022 30 [facility]  Metformin 1000 mg 05.12.2022 30 [facility]   Any gaps in medications fill history? No    Maia Breslow, [place] Pharmacist Assistant [phone]

## 2020-10-21 NOTE — Chronic Care Management (AMB) (Addendum)
Chronic Care Management Pharmacy Assistant   Name: Jonathan Calhoun  MRN: [id number] DOB: 02/15/1951  Reason for Encounter: Medication Review-Medication Coordination Call   Recent office visits:  None  Recent consult visits:  None  Hospital visits:  None in previous 6 months  Medications: Outpatient Encounter Medications as of 10/21/2020  Medication Sig  . atorvastatin (LIPITOR) 20 MG tablet Take 1 tablet (20 mg total) by mouth daily.  . blood glucose meter kit and supplies KIT Dispense based on patient and insurance preference. Use up to four times daily as directed. (FOR ICD-9 250.00, 250.01).  . Blood Glucose Monitoring Suppl (ONETOUCH VERIO) w/Device KIT Use to test three times daily.  . Cholecalciferol (VITAMIN D3) 50 MCG (2000 UT) TABS Take 2,000 Units by mouth daily.  . citalopram (CELEXA) 10 MG tablet TAKE ONE TABLET BY MOUTH EVERY MORNING  . glucose blood (ONETOUCH VERIO) test strip Use to test three times daily.  . Lancets (ONETOUCH ULTRASOFT) lancets Use to test three times daily.  [name] Kitchen lisinopril (ZESTRIL) 5 MG tablet TAKE ONE TABLET BY MOUTH EVERY MORNING  . metFORMIN (GLUCOPHAGE) 1000 MG tablet TAKE ONE TABLET BY MOUTH TWICE DAILY  . prednisoLONE acetate (PRED FORTE) 1 % ophthalmic suspension Place 1 drop into the right eye 4 (four) times daily. X 7 days  . TRULICITY 0.75 MG/0.5ML SOPN INJECT 0.75mg  into THE SKIN ONCE A WEEK   No facility-administered encounter medications on file as of 10/21/2020.   Reviewed chart for medication changes ahead of medication coordination call.  BP Readings from Last 3 Encounters:  06/30/20 128/80  03/17/20 120/80  10/07/19 120/72    Lab Results  Component Value Date   HGBA1C 6.4 06/30/2020    Patient obtains medications through Adherence Packaging  30 Days  Last adherence delivery included:  . Atorvastatin 20 mg: one tablet at breakfast . Citalopram 10 mg: one tablet at breakfast . Lisinopril 5 mg: one tablet at  breakfast . Metformin 1000 mg: one tablet at breakfast and on at dinner . Vitamin D3 2,000 unit at breakfast . Trulicity 0.75 MG- Inject into skin weekly   Patient declined the following medication last month due to PRN use/additional supply on hand. . Trulicity 0.75 MG- Inject into skin weekly  . OneTouch Ultrasoft lancets: use to test three times a daily . OneTouch Verio test strips: use to test three times a daily  Patient is due for next adherence delivery on: 10/28/2020. Called patient and reviewed medications and coordinated delivery. This delivery to include:  . Atorvastatin 20 mg: one tablet at breakfast . Citalopram 10 mg: one tablet at breakfast . Lisinopril 5 mg: one tablet at breakfast . Metformin 1000 mg: one tablet at breakfast and on at dinner . Vitamin D3 2,000 unit at breakfast . Trulicity 0.75 MG- Inject into skin weekly   Patient declined the following medications due to PRN use.  OneTouch Ultrasoft lancets: use to test three times a daily  OneTouch Verio test strips: use to test three times a daily  Patient needs refills for Metformin 1,000 mg. Confirmed delivery date of 10/28/2020, advised patient that pharmacy will contact them the morning of delivery.  Patient was not able to make follow-up appointment June. His appointment was rescheduled to August 2022.  Star Rating Drugs: Medication Dispensed Quantity Pharmacy  Lisinopril 5 mg 05.12.2022 30 [facility]  Atorvastatin 20 mg 05.12.2022 30 [facility]  Metformin 1,000 mg 05.12.2022 60 [facility]   Amilia Tomma Rakers, [facility] Clinical Pharmacist Assistant [phone]

## 2020-10-22 ENCOUNTER — Ambulatory Visit: Payer: PPO

## 2020-11-17 ENCOUNTER — Other Ambulatory Visit: Payer: Self-pay | Admitting: Adult Health

## 2020-11-17 DIAGNOSIS — E1169 Type 2 diabetes mellitus with other specified complication: Secondary | ICD-10-CM

## 2020-11-19 ENCOUNTER — Telehealth: Payer: Self-pay | Admitting: Pharmacist

## 2020-11-19 NOTE — Chronic Care Management (AMB) (Signed)
Chronic Care Management Pharmacy Assistant   Name: Jonathan Calhoun  MRN: [id number] DOB: 06-03-1950  Reason for Encounter: Medication Review-Medication Coordination Call  Recent office visits:  None  Recent consult visits:  None  Hospital visits:  None in previous 6 months  Medications: Outpatient Encounter Medications as of 11/19/2020  Medication Sig   atorvastatin (LIPITOR) 20 MG tablet Take 1 tablet (20 mg total) by mouth daily.   blood glucose meter kit and supplies KIT Dispense based on patient and insurance preference. Use up to four times daily as directed. (FOR ICD-9 250.00, 250.01).   Blood Glucose Monitoring Suppl (ONETOUCH VERIO) w/Device KIT Use to test three times daily.   Cholecalciferol (VITAMIN D3) 50 MCG (2000 UT) TABS Take 2,000 Units by mouth daily.   citalopram (CELEXA) 10 MG tablet TAKE ONE TABLET BY MOUTH EVERY MORNING   glucose blood (ONETOUCH VERIO) test strip Use to test three times daily.   Lancets (ONETOUCH ULTRASOFT) lancets Use to test three times daily.   lisinopril (ZESTRIL) 5 MG tablet TAKE ONE TABLET BY MOUTH EVERY MORNING   metFORMIN (GLUCOPHAGE) 1000 MG tablet TAKE ONE TABLET BY MOUTH TWICE DAILY   prednisoLONE acetate (PRED FORTE) 1 % ophthalmic suspension Place 1 drop into the right eye 4 (four) times daily. X 7 days   TRULICITY 5.17 GY/1.7CB SOPN INJECT 0.60m into THE SKIN ONCE A WEEK   No facility-administered encounter medications on file as of 11/19/2020.   Reviewed chart for medication changes ahead of medication coordination call. No OVs, Consults, or hospital visits since last care coordination call/Pharmacist visit.  No medication changes indicated OR if recent visit, treatment plan here.  BP Readings from Last 3 Encounters:  06/30/20 128/80  03/17/20 120/80  10/07/19 120/72    Lab Results  Component Value Date   HGBA1C 6.4 06/30/2020    Patient obtains medications through Adherence Packaging  30 Days  Last adherence  delivery included:  Atorvastatin 20 mg: one tablet at breakfast Citalopram 10 mg: one tablet at breakfast Lisinopril 5 mg: one tablet at breakfast Metformin 1000 mg: one tablet at breakfast and on at dinner Vitamin D3 2,000 unit at breakfast Trulicity 04.49MG- Inject into skin weekly   Patient declined the following medication last month due to PRN use/additional supply on hand. OneTouch Ultrasoft lancets: use to test three times a daily  OneTouch Verio test strips: use to test three times a daily  Patient is due for next adherence delivery on: 07.11.2022. Called patient and reviewed medications and coordinated delivery. This delivery to include: Atorvastatin 20 mg: one tablet at breakfast Citalopram 10 mg: one tablet at breakfast Lisinopril 5 mg: one tablet at breakfast Metformin 1000 mg: one tablet at breakfast and on at dinner Vitamin D3 2,000 unit at breakfast Trulicity 06.75MG- Inject into skin weekly   Patient declined the following medications  due to supply on hand. Patient does not check blood sugar daily. OneTouch Ultrasoft lancets: use to test three times a daily  OneTouch Verio test strips: use to test three times a daily  Patient does not need refill for any of his medication.  Confirmed delivery date of 07.11.2022, advised patient that pharmacy will contact them the morning of delivery.   Star Rating Drugs: Medication Dispensed  Quantity Pharmacy  Atorvastatin 20 mg 06.06.2022 30 [facility]  Lisinopril 5 mg 06.06.2022 30 [facility]  Metformin 1000 mg 06.06.2022 30 [facility]    Amilia (Revonda Standard CLemayPharmacist Assistant [phone]

## 2020-12-20 ENCOUNTER — Telehealth: Payer: Self-pay | Admitting: Pharmacist

## 2020-12-20 NOTE — Chronic Care Management (AMB) (Addendum)
    Chronic Care Management Pharmacy Assistant   Name: Jonathan Calhoun  MRN: [id number] DOB: 10-May-1951  Reason for Encounter: Medication Review Medication Coordination Call    Recent office visits:  None  Recent consult visits:  None  Hospital visits:  None in previous 6 months  Medications: Outpatient Encounter Medications as of 12/20/2020  Medication Sig   atorvastatin (LIPITOR) 20 MG tablet Take 1 tablet (20 mg total) by mouth daily.   blood glucose meter kit and supplies KIT Dispense based on patient and insurance preference. Use up to four times daily as directed. (FOR ICD-9 250.00, 250.01).   Blood Glucose Monitoring Suppl (ONETOUCH VERIO) w/Device KIT Use to test three times daily.   Cholecalciferol (VITAMIN D3) 50 MCG (2000 UT) TABS Take 2,000 Units by mouth daily.   citalopram (CELEXA) 10 MG tablet TAKE ONE TABLET BY MOUTH EVERY MORNING   glucose blood (ONETOUCH VERIO) test strip Use to test three times daily.   Lancets (ONETOUCH ULTRASOFT) lancets Use to test three times daily.   lisinopril (ZESTRIL) 5 MG tablet TAKE ONE TABLET BY MOUTH EVERY MORNING   metFORMIN (GLUCOPHAGE) 1000 MG tablet TAKE ONE TABLET BY MOUTH TWICE DAILY   prednisoLONE acetate (PRED FORTE) 1 % ophthalmic suspension Place 1 drop into the right eye 4 (four) times daily. X 7 days   TRULICITY 4.09 WJ/1.9JY SOPN INJECT 0.65m into THE SKIN ONCE A WEEK   No facility-administered encounter medications on file as of 12/20/2020.  Reviewed chart for medication changes ahead of medication coordination call.  No OVs, Consults, or hospital visits since last care coordination call/Pharmacist visit.    No medication changes indicated OR if recent visit, treatment plan here.  BP Readings from Last 3 Encounters:  06/30/20 128/80  03/17/20 120/80  10/07/19 120/72    Lab Results  Component Value Date   HGBA1C 6.4 06/30/2020     Patient obtains medications through Adherence Packaging  30 Days   Last  adherence delivery included:  Atorvastatin 20 mg: one tablet at breakfast Citalopram 10 mg: one tablet at breakfast Lisinopril 5 mg: one tablet at breakfast Metformin 1000 mg: one tablet at breakfast and on at dinner Vitamin D3 2,000 unit at breakfast Trulicity 07.82MG- Inject into skin weekly   Patient declined last month due to PRN use/additional supply on hand. OneTouch Ultrasoft lancets: use to test three times a daily  OneTouch Verio test strips: use to test three times a daily  Patient is due for next adherence delivery on: 12-24-2020. Called patient and reviewed medications and coordinated delivery.  This delivery to include: Atorvastatin 20 mg: one tablet at breakfast Citalopram 10 mg: one tablet at breakfast Lisinopril 5 mg: one tablet at breakfast Metformin 1000 mg: one tablet at breakfast and on at dinner Vitamin D3 2,000 unit at breakfast Trulicity 09.56MG- Inject into skin weekly    Confirmed delivery date of 12-24-2020, advised patient that pharmacy will contact them the morning of delivery.   Care Gaps:  AWV - no past appointment- MSG sent to RRamond CraverCMA to schedule. Zoster vaccine - Overdue Eye Exam - Overdue COVID Booster #3 (Therapist, music - Overdue Flu vaccine - Overdue Colonoscopy - Overdue  Star Rating Drugs:  LNed ClinesCPort LeydenClinical Pharmacist Assistant [phone]

## 2020-12-27 ENCOUNTER — Other Ambulatory Visit: Payer: Self-pay

## 2020-12-28 ENCOUNTER — Ambulatory Visit (INDEPENDENT_AMBULATORY_CARE_PROVIDER_SITE_OTHER): Payer: PPO | Admitting: Adult Health

## 2020-12-28 ENCOUNTER — Encounter: Payer: Self-pay | Admitting: Adult Health

## 2020-12-28 VITALS — BP 124/80 | HR 80 | Temp 98.7°F | Ht 74.0 in | Wt 234.0 lb

## 2020-12-28 DIAGNOSIS — I10 Essential (primary) hypertension: Secondary | ICD-10-CM

## 2020-12-28 DIAGNOSIS — E1169 Type 2 diabetes mellitus with other specified complication: Secondary | ICD-10-CM

## 2020-12-28 LAB — POCT GLYCOSYLATED HEMOGLOBIN (HGB A1C): Hemoglobin A1C: 5.9 % — AB (ref 4.0–5.6)

## 2020-12-28 NOTE — Progress Notes (Signed)
Subjective:    Patient ID: Jonathan Calhoun, male    DOB: 10/11/1950, 70 y.o.   MRN: [id number]  HPI  70 year old male who  has a past medical history of Allergy, Anxiety, Depression, Diabetes mellitus, History of alcoholism ([place]), History of shingles, Hyperlipidemia, Hypertension, and Ileus, postoperative ([place]).  He presents to the office today for 53-month follow-up regarding diabetes and hypertension.  Diabetes-maintained on metformin 1000 mg twice daily and Trulicity 8.76 mg weekly.  He denies hypoglycemic events and has been monitoring his blood sugar at home with readings consistently in the low 100's.  His last A1c in Feb 2022 was 6.4. He has cut out a lot sugars, drinking more water and is staying active. He has been able to lose about 10 pounds   HTN -prescribed lisinopril 5 mg daily.  He denies dizziness, lightheadedness, chest pain, shortness of breath BP Readings from Last 3 Encounters:  12/28/20 124/80  06/30/20 128/80  03/17/20 120/80   Wt Readings from Last 3 Encounters:  12/28/20 234 lb (106.1 kg)  06/30/20 241 lb (109.3 kg)  03/17/20 245 lb (111.1 kg)    Review of Systems See HPI   Past Medical History:  Diagnosis Date   Allergy    Anxiety    Depression    Diabetes mellitus    ORAL MEDS-NO INSULIN   History of alcoholism ([place])    History of shingles    Hyperlipidemia    Hypertension    Ileus, postoperative ([place])    Post-op Chole Surgery    Social History   Socioeconomic History   Marital status: Married    Spouse name: Not on file   Number of children: Not on file   Years of education: Not on file   Highest education level: Not on file  Occupational History   Not on file  Tobacco Use   Smoking status: Every Day    Packs/day: 0.00    Types: Cigars, Cigarettes   Smokeless tobacco: Never  Vaping Use   Vaping Use: Never used  Substance and Sexual Activity   Alcohol use: No    Comment: quit 3 yrs ago   Drug use: No   Sexual activity: Not on  file  Other Topics Concern   Not on file  Social History Narrative   Married, lives with spouse.   Plans for home after surgery.   Living will, healthcare POA.   Social Determinants of Health   Financial Resource Strain: Low Risk    Difficulty of Paying Living Expenses: Not hard at all  Food Insecurity: No Food Insecurity   Worried About Charity fundraiser in the Last Year: Never true   [place] in the Last Year: Never true  Transportation Needs: No Transportation Needs   Lack of Transportation (Medical): No   Lack of Transportation (Non-Medical): No  Physical Activity: Inactive   Days of Exercise per Week: 0 days   Minutes of Exercise per Session: 0 min  Stress: No Stress Concern Present   Feeling of Stress : Not at all  Social Connections: Socially Integrated   Frequency of Communication with Friends and Family: More than three times a week   Frequency of Social Gatherings with Friends and Family: More than three times a week   Attends Religious Services: More than 4 times per year   Active Member of [employer] or Organizations: Yes   Attends Archivist Meetings: More than 4 times per year  Marital Status: Married  Human resources officer Violence: Not At Risk   Fear of Current or Ex-Partner: No   Emotionally Abused: No   Physically Abused: No   Sexually Abused: No    Past Surgical History:  Procedure Laterality Date   BONE EXOSTOSIS EXCISION  08/10/2011   Procedure: EXOSTOSIS EXCISION;  Surgeon: Marcheta Grammes, DPM;  Location: [facility];  Service: Orthopedics;  Laterality: Left;  Exostectomy of Calcaneous Left Foot   CARDIAC CATHETERIZATION  2002   CHOLECYSTECTOMY     ENDOVENOUS ABLATION SAPHENOUS VEIN W/ LASER Left 01/01/2019   endovenous laser ablation left greater saphenous vein by Ruta Hinds MD    OSTECTOMY Right 04/03/2018   Procedure: OSTECTOMY RIGHT CALCANEUS;  Surgeon: Caprice Beaver, DPM;  Location: [facility];  Service: Podiatry;  Laterality:  Right;   RIGHT SHOULDER SURGERY FOR TEAR AND SPUR - YRS AGO     SYNOVECTOMY  08/10/2011   Procedure: SYNOVECTOMY;  Surgeon: Marcheta Grammes, DPM;  Location: [facility];  Service: Orthopedics;  Laterality: Left;  Synovectomy of Peroneal Tendon Left Foot   TENDON REPAIR  08/10/2011   Procedure: TENDON REPAIR;  Surgeon: Marcheta Grammes, DPM;  Location: [facility];  Service: Orthopedics;  Laterality: Left;  Repair of Peroneal Tendon Left Foot   TENDON REPAIR Right 04/03/2018   Procedure: EXPLORATION OF PERONEAL TENDON WITH POSSIBLE REPAIR OF LONGITUDINAL TEAR OF THE PERONEAL TENDON RIGHT FOOT;  Surgeon: Caprice Beaver, DPM;  Location: [facility];  Service: Podiatry;  Laterality: Right;   TONSILLECTOMY     TOTAL HIP ARTHROPLASTY  03/29/2011   Procedure: TOTAL HIP ARTHROPLASTY;  Surgeon: Dione Plover Aluisio;  Location: [facility];  Service: Orthopedics;  Laterality: Right;    Family History  Problem Relation Age of Onset   Depression Mother    Cancer Mother        colon   Heart failure Mother    Colon cancer Mother    Coronary artery disease Father    Hypertension Father     No Known Allergies  Current Outpatient Medications on File Prior to Visit  Medication Sig Dispense Refill   atorvastatin (LIPITOR) 20 MG tablet Take 1 tablet (20 mg total) by mouth daily. 90 tablet 3   blood glucose meter kit and supplies KIT Dispense based on patient and insurance preference. Use up to four times daily as directed. (FOR ICD-9 250.00, 250.01). 1 each 0   Blood Glucose Monitoring Suppl (ONETOUCH VERIO) w/Device KIT Use to test three times daily. 1 kit 0   Cholecalciferol (VITAMIN D3) 50 MCG (2000 UT) TABS Take 2,000 Units by mouth daily.     citalopram (CELEXA) 10 MG tablet TAKE ONE TABLET BY MOUTH EVERY MORNING 90 tablet 1   glucose blood (ONETOUCH VERIO) test strip Use to test three times daily. 300 each 3   Lancets (ONETOUCH ULTRASOFT) lancets Use to test three times daily. 300 each 3   lisinopril  (ZESTRIL) 5 MG tablet TAKE ONE TABLET BY MOUTH EVERY MORNING 90 tablet 3   metFORMIN (GLUCOPHAGE) 1000 MG tablet TAKE ONE TABLET BY MOUTH TWICE DAILY 180 tablet 1   prednisoLONE acetate (PRED FORTE) 1 % ophthalmic suspension Place 1 drop into the right eye 4 (four) times daily. X 7 days 5 mL 0   TRULICITY 0.93 GH/8.2XH SOPN INJECT 0.$RemoveBefor'75mg'DYsLqkAdHwhC$  into THE SKIN ONCE A WEEK 6 mL 0   No current facility-administered medications on file prior to visit.    BP 124/80   Pulse 80  Temp 98.7 F (37.1 C) (Oral)   Ht $R'6\' 2"'nz$  (1.88 m)   Wt 234 lb (106.1 kg)   SpO2 95%   BMI 30.04 kg/m       Objective:   Physical Exam Vitals and nursing note reviewed.  Constitutional:      Appearance: Normal appearance.  Cardiovascular:     Rate and Rhythm: Normal rate and regular rhythm.     Pulses: Normal pulses.     Heart sounds: Normal heart sounds.  Pulmonary:     Effort: Pulmonary effort is normal.     Breath sounds: Normal breath sounds.  Abdominal:     General: Abdomen is flat.     Palpations: Abdomen is soft.  Musculoskeletal:        General: Normal range of motion.  Skin:    General: Skin is warm and dry.  Neurological:     General: No focal deficit present.     Mental Status: He is alert and oriented to person, place, and time.  Psychiatric:        Mood and Affect: Mood normal.        Behavior: Behavior normal.        Thought Content: Thought content normal.      Assessment & Plan:  1. Type 2 diabetes mellitus with other specified complication, without long-term current use of insulin (HCC)  - POCT glycosylated hemoglobin (Hb A1C)- 5.9 - has improved  - No change in medications  - Follow up in Oct 2022 for CPE  2. Essential hypertension - Well controlled.  - No change in medications   [name], NP

## 2021-01-05 DIAGNOSIS — Z961 Presence of intraocular lens: Secondary | ICD-10-CM | POA: Diagnosis not present

## 2021-01-05 DIAGNOSIS — E119 Type 2 diabetes mellitus without complications: Secondary | ICD-10-CM | POA: Diagnosis not present

## 2021-01-05 LAB — HM DIABETES EYE EXAM

## 2021-01-06 ENCOUNTER — Encounter: Payer: Self-pay | Admitting: Adult Health

## 2021-02-14 ENCOUNTER — Other Ambulatory Visit: Payer: Self-pay | Admitting: Adult Health

## 2021-02-14 DIAGNOSIS — Z760 Encounter for issue of repeat prescription: Secondary | ICD-10-CM

## 2021-02-14 DIAGNOSIS — E1169 Type 2 diabetes mellitus with other specified complication: Secondary | ICD-10-CM

## 2021-02-15 ENCOUNTER — Telehealth: Payer: Self-pay | Admitting: Pharmacist

## 2021-02-15 NOTE — Chronic Care Management (AMB) (Signed)
Chronic Care Management Pharmacy Assistant   Name: Jonathan Calhoun  MRN: [id number] DOB: 02-Sep-1950  Reason for Encounter: Medication Review / Medication Coordination Call & General Assessment   Conditions to be addressed/monitored: HTN and DMII  Recent office visits:  12-28-2020 [name], NP - Patient presented for Type 2 diabetes mellitus with other specified complication, without long-term current use of insulin and hypertension. No medication changes.  Recent consult visits:  None   Hospital visits:  None in previous 6 months  Medications: Outpatient Encounter Medications as of 02/15/2021  Medication Sig   atorvastatin (LIPITOR) 20 MG tablet Take 1 tablet (20 mg total) by mouth daily.   blood glucose meter kit and supplies KIT Dispense based on patient and insurance preference. Use up to four times daily as directed. (FOR ICD-9 250.00, 250.01).   Blood Glucose Monitoring Suppl (ONETOUCH VERIO) w/Device KIT Use to test three times daily.   Cholecalciferol (VITAMIN D3) 50 MCG (2000 UT) TABS Take 2,000 Units by mouth daily.   citalopram (CELEXA) 10 MG tablet TAKE ONE TABLET BY MOUTH EVERY MORNING   glucose blood (ONETOUCH VERIO) test strip Use to test three times daily.   Lancets (ONETOUCH ULTRASOFT) lancets Use to test three times daily.   lisinopril (ZESTRIL) 5 MG tablet TAKE ONE TABLET BY MOUTH EVERY MORNING   metFORMIN (GLUCOPHAGE) 1000 MG tablet TAKE ONE TABLET BY MOUTH TWICE DAILY   prednisoLONE acetate (PRED FORTE) 1 % ophthalmic suspension Place 1 drop into the right eye 4 (four) times daily. X 7 days   TRULICITY 5.17 OH/6.0VP SOPN INJECT 0.38m into THE SKIN ONCE A WEEK   No facility-administered encounter medications on file as of 02/15/2021.  Reviewed chart for medication changes ahead of medication coordination call.  No OVs, Consults, or hospital visits since last care coordination call/Pharmacist visit.  No medication changes indicated  BP Readings from  Last 3 Encounters:  12/28/20 124/80  06/30/20 128/80  03/17/20 120/80    Lab Results  Component Value Date   HGBA1C 5.9 (A) 12/28/2020  Reviewed chart prior to disease state call. Spoke with patient regarding BP  Recent Office Vitals: BP Readings from Last 3 Encounters:  12/28/20 124/80  06/30/20 128/80  03/17/20 120/80   Pulse Readings from Last 3 Encounters:  12/28/20 80  03/17/20 66  04/30/19 (!) 59    Wt Readings from Last 3 Encounters:  12/28/20 234 lb (106.1 kg)  06/30/20 241 lb (109.3 kg)  03/17/20 245 lb (111.1 kg)     Kidney Function Lab Results  Component Value Date/Time   CREATININE 0.73 03/17/2020 08:17 AM   CREATININE 0.75 02/27/2019 07:43 AM   CREATININE 0.63 04/02/2018 01:40 PM   GFR 103.17 02/27/2019 07:43 AM   GFRNONAA 94 03/17/2020 08:17 AM   GFRAA 109 03/17/2020 08:17 AM    BMP Latest Ref Rng & Units 03/17/2020 02/27/2019 04/02/2018  Glucose 65 - 99 mg/dL 127(H) 129(H) 83  BUN 7 - 25 mg/dL _0 Creatinine 0.70 - 1.18 mg/dL 0.73 0.75 0.63  BUN/Creat Ratio 6 - 22 (calc) NOT APPLICABLE - -  Sodium 1710- 146 mmol/L 140 138 136  Potassium 3.5 - 5.3 mmol/L 5.0 4.7 4.0  Chloride 98 - 110 mmol/L 105 103 103  CO2 20 - 32 mmol/L _1 Calcium 8.6 - 10.3 mg/dL 9.6 9.7 9.5    Current antihypertensive regimen:  Lisinopril 5 mg: one tablet at breakfast How often are you checking your Blood  Pressure? Patient reports he is checking his blood pressures at leas once a week Current home BP readings: 120/75 What recent interventions/DTPs have been made by any provider to improve Blood Pressure control since last CPP Visit: Patient reports none Any recent hospitalizations or ED visits since last visit with CPP? No What diet changes have been made to improve Blood Pressure Control?  Patient reports he and wife are now on a meal plan with little to no sodium, low carb's and proteins What exercise is being done to improve your Blood Pressure Control?   Patient reports they also joined a fitness center last month and work out 3 times a week  Adherence Review: Is the patient currently on ACE/ARB medication? Yes Does the patient have >5 day gap between last estimated fill dates? No   Recent Relevant Labs: Lab Results  Component Value Date/Time   HGBA1C 5.9 (A) 12/28/2020 10:01 AM   HGBA1C 6.4 06/30/2020 09:21 AM   HGBA1C 6.3 (H) 03/17/2020 08:17 AM   HGBA1C 5.9 10/07/2019 07:11 AM   MICROALBUR 0.7 01/25/2016 08:57 AM   MICROALBUR 1.7 09/14/2015 08:42 AM    Kidney Function Lab Results  Component Value Date/Time   CREATININE 0.73 03/17/2020 08:17 AM   CREATININE 0.75 02/27/2019 07:43 AM   CREATININE 0.63 04/02/2018 01:40 PM   GFR 103.17 02/27/2019 07:43 AM   GFRNONAA 94 03/17/2020 08:17 AM   GFRAA 109 03/17/2020 08:17 AM    Current antihyperglycemic regimen:  Metformin 1000 mg: one tablet at breakfast and one at dinner Trulicity 4.01 MG- Inject into skin weekly  Have there been any recent hospitalizations or ED visits since last visit with CPP? No Patient denies hypoglycemic symptoms, including Pale, Sweaty, Shaky, Hungry, Nervous/irritable, and Vision changes Patient denies hyperglycemic symptoms, including blurry vision, excessive thirst, fatigue, polyuria, and weakness How often are you checking your blood sugar? in the morning before eating or drinking What are your blood sugars ranging?  Fasting: 95, does not go above 110 During the week, how often does your blood glucose drop below 70? Never Are you checking your feet daily/regularly?   Adherence Review: Is the patient currently on a STATIN medication? Yes Is the patient currently on ACE/ARB medication? Yes Does the patient have >5 day gap between last estimated fill dates? No    Patient obtains medications through Adherence Packaging  30 Days   Last adherence delivery included:  Atorvastatin 20 mg: one tablet at breakfast Citalopram 10 mg: one tablet at  breakfast Lisinopril 5 mg: one tablet at breakfast Metformin 1000 mg: one tablet at breakfast and on at dinner Vitamin D3 2,000 unit at breakfast Trulicity 0.27 MG- Inject into skin weekly    Patient declined last month due to PRN use/additional supply on hand. OneTouch Ultrasoft lancets: use to test three times a daily  OneTouch Verio test strips: use to test three times a daily     Confirmed delivery date of 12-24-2020, advised patient that pharmacy will contact them the morning of delivery.    Patient is due for next adherence delivery on: 02-25-2021. Called patient and reviewed medications and coordinated delivery. Confirmed Packaging for 30 DS   This delivery to include: Metformin 1000 mg: one tablet at breakfast and one at dinner Lisinopril 5 mg: one tablet at breakfast Citalopram 10 mg: one tablet at breakfast Vitamin D3 2,000 unit at breakfast Atorvastatin 20 mg: one tablet at breakfast Trulicity 2.53 MG- Inject into skin weekly     Patient Declined the need  for refills for  OneTouch Ultrasoft lancets: use to test three times a daily  OneTouch Verio test strips: use to test three times a daily  Confirmed delivery date of 02-25-2021, advised patient that pharmacy will contact him the morning of delivery.   Care Gaps: AWV - 03-22-22 Zoster vaccine - Overdue Eye Exam - Overdue COVID Booster #3 Therapist, music) - Overdue Flu vaccine - Overdue Colonoscopy - Overdue CCM -06-24-21 A1C- 5.8 BP- 124/80  Star Rating Drugs: Dulaglutide (Trulicity) 7.82 mg - Last filled 01-20-2021 28 DS at [facility] Metformin (Glucophage) 1000 mg - Last filled 01-20-2021 30 [facility] Lisinopril (Zestril) 5 mg - Last filled 01-20-2021 30 DS [facility] Atorvastatin (Lipitor) 20 mg - Last filled 01-20-2021 30 DS at [facility] Pharmacist Assistant [phone]

## 2021-02-17 ENCOUNTER — Encounter: Payer: Self-pay | Admitting: Gastroenterology

## 2021-03-16 ENCOUNTER — Telehealth: Payer: Self-pay | Admitting: Pharmacist

## 2021-03-16 NOTE — Chronic Care Management (AMB) (Signed)
Chronic Care Management Pharmacy Assistant   Name: Jonathan Calhoun  MRN: [id number] DOB: 02/25/1951  Reason for Encounter: Medication Review / Medication Coordination & Gen Assessment   Recent office visits:  None  Recent consult visits:  None  Hospital visits:  None in previous 6 months  Medications: Outpatient Encounter Medications as of 03/16/2021  Medication Sig   atorvastatin (LIPITOR) 20 MG tablet Take 1 tablet (20 mg total) by mouth daily.   blood glucose meter kit and supplies KIT Dispense based on patient and insurance preference. Use up to four times daily as directed. (FOR ICD-9 250.00, 250.01).   Blood Glucose Monitoring Suppl (ONETOUCH VERIO) w/Device KIT Use to test three times daily.   Cholecalciferol (VITAMIN D3) 50 MCG (2000 UT) TABS Take 2,000 Units by mouth daily.   citalopram (CELEXA) 10 MG tablet TAKE ONE TABLET BY MOUTH EVERY MORNING   glucose blood (ONETOUCH VERIO) test strip Use to test three times daily.   Lancets (ONETOUCH ULTRASOFT) lancets Use to test three times daily.   lisinopril (ZESTRIL) 5 MG tablet TAKE ONE TABLET BY MOUTH EVERY MORNING   metFORMIN (GLUCOPHAGE) 1000 MG tablet TAKE ONE TABLET BY MOUTH TWICE DAILY   prednisoLONE acetate (PRED FORTE) 1 % ophthalmic suspension Place 1 drop into the right eye 4 (four) times daily. X 7 days   TRULICITY 0.75 MG/0.5ML SOPN INJECT 0.75mg  into THE SKIN ONCE A WEEK   No facility-administered encounter medications on file as of 03/16/2021.   Recent Relevant Labs: Lab Results  Component Value Date/Time   HGBA1C 5.9 (A) 12/28/2020 10:01 AM   HGBA1C 6.4 06/30/2020 09:21 AM   HGBA1C 6.3 (H) 03/17/2020 08:17 AM   HGBA1C 5.9 10/07/2019 07:11 AM   MICROALBUR 0.7 01/25/2016 08:57 AM   MICROALBUR 1.7 09/14/2015 08:42 AM    Kidney Function Lab Results  Component Value Date/Time   CREATININE 0.73 03/17/2020 08:17 AM   CREATININE 0.75 02/27/2019 07:43 AM   CREATININE 0.63 04/02/2018 01:40 PM   GFR 103.17  02/27/2019 07:43 AM   GFRNONAA 94 03/17/2020 08:17 AM   GFRAA 109 03/17/2020 08:17 AM    Current antihyperglycemic regimen:  Metformin 1000 mg: one tablet at breakfast and one at dinner Trulicity 0.75 MG- Inject into skin weekly  What recent interventions/DTPs have been made to improve glycemic control:  Patient reports no changes Have there been any recent hospitalizations or ED visits since last visit with CPP? No Patient denies hypoglycemic symptoms, including None Patient denies hyperglycemic symptoms, including none How often are you checking your blood sugar? Patient checks fasting a couple times a week What are your blood sugars ranging?  Fasting: Patient reports average of 110 During the week, how often does your blood glucose drop below 70? Never Patient reports it has been some time since it has been under 70  Adherence Review: Is the patient currently on a STATIN medication? Yes Is the patient currently on ACE/ARB medication? Yes Does the patient have >5 day gap between last estimated fill dates? No  Reviewed chart for medication changes ahead of medication coordination call.  No OVs, Consults, or hospital visits since last care coordination call/Pharmacist visit.   No medication changes indicated.  BP Readings from Last 3 Encounters:  12/28/20 124/80  06/30/20 128/80  03/17/20 120/80    Lab Results  Component Value Date   HGBA1C 5.9 (A) 12/28/2020     Patient obtains medications through Adherence Packaging  30 Days   Last adherence delivery  included:  Metformin 1000 mg: one tablet at breakfast and one at dinner Lisinopril 5 mg: one tablet at breakfast Citalopram 10 mg: one tablet at breakfast Vitamin D3 2,000 unit at breakfast Atorvastatin 20 mg: one tablet at breakfast Trulicity 6.84 MG- Inject into skin weekly      Patient Declined the need for refills for  OneTouch Ultrasoft lancets: use to test three times a daily  OneTouch Verio test strips: use  to test three times a daily    Patient is due for next adherence delivery on: 03/28/21. Called patient and reviewed medications and coordinated delivery. Confirmed packaging for 30 DS  This delivery to include: Metformin 1000 mg: one tablet at breakfast and one at dinner Lisinopril 5 mg: one tablet at breakfast Citalopram 10 mg: one tablet at breakfast Vitamin D3 2,000 unit at breakfast Atorvastatin 20 mg: one tablet at breakfast Trulicity 0.33 MG- Inject into skin weekly   Patient Declined the need for refills for  OneTouch Ultrasoft lancets: use to test three times a daily  OneTouch Verio test strips: use to test three times a daily     Confirmed delivery date of 03/28/21, advised patient that pharmacy will contact him the morning of delivery.    Care Gaps: AWV - 11/23 COVID Booster #3 Therapist, music) - Overdue Flu vaccine - Overdue Colonoscopy - Overdue CCM -2/23 BP- 124/80   Lab Results  Component Value Date   HGBA1C 5.9 (A) 12/28/2020    Star Rating Drugs: Dulaglutide (Trulicity) 5.33 mg - Last filled 02/22/2021 28 DS at [facility] Metformin (Glucophage) 1000 mg - Last filled 02/22/2021 30 DS [facility] Lisinopril (Zestril) 5 mg - Last filled 02/22/2021 30 DS at [facility] Atorvastatin (Lipitor) 20 mg - Last filled 02/22/2021 30 DS at [place] Pharmacist Assistant [phone]

## 2021-03-21 ENCOUNTER — Other Ambulatory Visit: Payer: Self-pay

## 2021-03-22 ENCOUNTER — Encounter: Payer: Self-pay | Admitting: Adult Health

## 2021-03-22 ENCOUNTER — Encounter: Payer: Self-pay | Admitting: Gastroenterology

## 2021-03-22 ENCOUNTER — Ambulatory Visit (INDEPENDENT_AMBULATORY_CARE_PROVIDER_SITE_OTHER): Payer: PPO | Admitting: Adult Health

## 2021-03-22 ENCOUNTER — Telehealth: Payer: Self-pay | Admitting: Adult Health

## 2021-03-22 VITALS — BP 130/80 | HR 69 | Temp 98.9°F | Ht 74.0 in | Wt 224.1 lb

## 2021-03-22 DIAGNOSIS — I10 Essential (primary) hypertension: Secondary | ICD-10-CM | POA: Diagnosis not present

## 2021-03-22 DIAGNOSIS — M65332 Trigger finger, left middle finger: Secondary | ICD-10-CM | POA: Diagnosis not present

## 2021-03-22 DIAGNOSIS — E782 Mixed hyperlipidemia: Secondary | ICD-10-CM

## 2021-03-22 DIAGNOSIS — E1169 Type 2 diabetes mellitus with other specified complication: Secondary | ICD-10-CM | POA: Diagnosis not present

## 2021-03-22 DIAGNOSIS — Z0000 Encounter for general adult medical examination without abnormal findings: Secondary | ICD-10-CM | POA: Diagnosis not present

## 2021-03-22 DIAGNOSIS — N401 Enlarged prostate with lower urinary tract symptoms: Secondary | ICD-10-CM | POA: Diagnosis not present

## 2021-03-22 DIAGNOSIS — R351 Nocturia: Secondary | ICD-10-CM

## 2021-03-22 DIAGNOSIS — Z23 Encounter for immunization: Secondary | ICD-10-CM

## 2021-03-22 LAB — CBC WITH DIFFERENTIAL/PLATELET
Basophils Absolute: 0.1 10*3/uL (ref 0.0–0.1)
Basophils Relative: 1 % (ref 0.0–3.0)
Eosinophils Absolute: 0.1 10*3/uL (ref 0.0–0.7)
Eosinophils Relative: 1.3 % (ref 0.0–5.0)
HCT: 44.1 % (ref 39.0–52.0)
Hemoglobin: 15 g/dL (ref 13.0–17.0)
Lymphocytes Relative: 37.7 % (ref 12.0–46.0)
Lymphs Abs: 2.2 10*3/uL (ref 0.7–4.0)
MCHC: 33.9 g/dL (ref 30.0–36.0)
MCV: 92.1 fl (ref 78.0–100.0)
Monocytes Absolute: 0.5 10*3/uL (ref 0.1–1.0)
Monocytes Relative: 8.2 % (ref 3.0–12.0)
Neutro Abs: 3.1 10*3/uL (ref 1.4–7.7)
Neutrophils Relative %: 51.8 % (ref 43.0–77.0)
Platelets: 202 10*3/uL (ref 150.0–400.0)
RBC: 4.79 Mil/uL (ref 4.22–5.81)
RDW: 15.1 % (ref 11.5–15.5)
WBC: 5.9 10*3/uL (ref 4.0–10.5)

## 2021-03-22 LAB — COMPREHENSIVE METABOLIC PANEL
ALT: 22 U/L (ref 0–53)
AST: 23 U/L (ref 0–37)
Albumin: 4.7 g/dL (ref 3.5–5.2)
Alkaline Phosphatase: 77 U/L (ref 39–117)
BUN: 12 mg/dL (ref 6–23)
CO2: 28 mEq/L (ref 19–32)
Calcium: 9.8 mg/dL (ref 8.4–10.5)
Chloride: 102 mEq/L (ref 96–112)
Creatinine, Ser: 0.78 mg/dL (ref 0.40–1.50)
GFR: 89.81 mL/min (ref >60.00)
Glucose, Bld: 107 mg/dL — ABNORMAL HIGH (ref 70–99)
Potassium: 4.6 mEq/L (ref 3.5–5.1)
Sodium: 138 mEq/L (ref 135–145)
Total Bilirubin: 1.1 mg/dL (ref 0.2–1.2)
Total Protein: 7.2 g/dL (ref 6.0–8.3)

## 2021-03-22 LAB — LIPID PANEL
Cholesterol: 134 mg/dL (ref 0–200)
HDL: 41.7 mg/dL (ref >39.00)
LDL Cholesterol: 72 mg/dL (ref 0–99)
NonHDL: 92
Total CHOL/HDL Ratio: 3
Triglycerides: 102 mg/dL (ref 0.0–149.0)
VLDL: 20.4 mg/dL (ref 0.0–40.0)

## 2021-03-22 LAB — PSA: PSA: 0.21 ng/mL (ref 0.10–4.00)

## 2021-03-22 LAB — TSH: TSH: 2.07 u[IU]/mL (ref 0.35–5.50)

## 2021-03-22 LAB — HEMOGLOBIN A1C: Hgb A1c MFr Bld: 6.1 % (ref 4.6–6.5)

## 2021-03-22 MED ORDER — METHYLPREDNISOLONE ACETATE 40 MG/ML IJ SUSP
40.0000 mg | Freq: Once | INTRAMUSCULAR | Status: AC
  Administered 2021-03-22: 40 mg via INTRA_ARTICULAR

## 2021-03-22 NOTE — Telephone Encounter (Signed)
Updated patient on his labs  

## 2021-03-22 NOTE — Addendum Note (Signed)
Addended by: Apolinar Junes on: 03/22/2021 12:53 PM   Modules accepted: Orders

## 2021-03-22 NOTE — Progress Notes (Addendum)
Subjective:    Patient ID: Jonathan Calhoun, male    DOB: 1950/09/21, 70 y.o.   MRN: [id number]  HPI Patient presents for yearly preventative medicine examination. He is a pleasant and active 70 year old male who  has a past medical history of Allergy, Anxiety, Depression, Diabetes mellitus, History of alcoholism ([place]), History of shingles, Hyperlipidemia, Hypertension, and Ileus, postoperative ([place]).  DM -maintained on metformin 1000 mg twice daily and Trulicity 9.93 mg weekly.  He denies hypoglycemic events and has been monitoring his blood sugars at home with readings consistently in the low 100s.  Is cut out a lot of sugars, been drinking more water and is staying active by going to the gym 3 times a week.  Wt Readings from Last 5 Encounters:  03/22/21 224 lb 2 oz (101.7 kg)  12/28/20 234 lb (106.1 kg)  06/30/20 241 lb (109.3 kg)  03/17/20 245 lb (111.1 kg)  10/07/19 240 lb (108.9 kg)    Lab Results  Component Value Date   HGBA1C 5.9 (A) 12/28/2020   HTN -prescribed lisinopril 5 mg daily.  He denies dizziness, lightheadedness, chest pain, shortness of breath BP Readings from Last 3 Encounters:  03/22/21 130/80  12/28/20 124/80  06/30/20 128/80   Hyperlipidemia-takes Lipitor 20 mg daily.  He denies myalgia or fatigue Lab Results  Component Value Date   CHOL 137 03/17/2020   HDL 39 (L) 03/17/2020   LDLCALC 70 03/17/2020   TRIG 215 (H) 03/17/2020   CHOLHDL 3.5 03/17/2020   Depression-was taking Celexa but weaned himself off. Feels as though he has no depression currently   Trigger Finger - left middle finger. Has been present for multiple months. Seems to be getting worse. He is experiencing a popping and grabbing sensation when he tries to straighten his finger.    All immunizations and health maintenance protocols were reviewed with the patient and needed orders were placed.  Appropriate screening laboratory values were ordered for the patient including screening of  hyperlipidemia, renal function and hepatic function.   Medication reconciliation,  past medical history, social history, problem list and allergies were reviewed in detail with the patient  Goals were established with regard to weight loss, exercise, and  diet in compliance with medications.  End of life planning was discussed.  He is due for 5-year follow up colonoscopy - recall letter sent - he needs to schedule and he will call.    Review of Systems  Constitutional: Negative.   HENT: Negative.    Eyes: Negative.   Respiratory: Negative.    Cardiovascular: Negative.   Gastrointestinal: Negative.   Endocrine: Negative.   Genitourinary: Negative.   Musculoskeletal: Negative.   Skin: Negative.   Allergic/Immunologic: Negative.   Neurological: Negative.   Hematological: Negative.   Psychiatric/Behavioral: Negative.    All other systems reviewed and are negative. Past Medical History:  Diagnosis Date   Allergy    Anxiety    Depression    Diabetes mellitus    ORAL MEDS-NO INSULIN   History of alcoholism ([place])    History of shingles    Hyperlipidemia    Hypertension    Ileus, postoperative ([place])    Post-op Chole Surgery    Social History   Socioeconomic History   Marital status: Married    Spouse name: Not on file   Number of children: Not on file   Years of education: Not on file   Highest education level: Not on file  Occupational History  Not on file  Tobacco Use   Smoking status: Every Day    Packs/day: 0.00    Types: Cigars, Cigarettes   Smokeless tobacco: Never  Vaping Use   Vaping Use: Never used  Substance and Sexual Activity   Alcohol use: No    Comment: quit 3 yrs ago   Drug use: No   Sexual activity: Not on file  Other Topics Concern   Not on file  Social History Narrative   Married, lives with spouse.   Plans for home after surgery.   Living will, healthcare POA.   Social Determinants of Health   Financial Resource Strain: Low Risk     Difficulty of Paying Living Expenses: Not hard at all  Food Insecurity: No Food Insecurity   Worried About Charity fundraiser in the Last Year: Never true   Arboriculturist in the Last Year: Never true  Transportation Needs: Not on file  Physical Activity: Inactive   Days of Exercise per Week: 0 days   Minutes of Exercise per Session: 0 min  Stress: No Stress Concern Present   Feeling of Stress : Not at all  Social Connections: Socially Integrated   Frequency of Communication with Friends and Family: More than three times a week   Frequency of Social Gatherings with Friends and Family: More than three times a week   Attends Religious Services: More than 4 times per year   Active Member of Clubs or Organizations: Yes   Attends Music therapist: More than 4 times per year   Marital Status: Married  Human resources officer Violence: Not At Risk   Fear of Current or Ex-Partner: No   Emotionally Abused: No   Physically Abused: No   Sexually Abused: No    Past Surgical History:  Procedure Laterality Date   BONE EXOSTOSIS EXCISION  08/10/2011   Procedure: EXOSTOSIS EXCISION;  Surgeon: Marcheta Grammes, DPM;  Location: [facility];  Service: Orthopedics;  Laterality: Left;  Exostectomy of Calcaneous Left Foot   CARDIAC CATHETERIZATION  2002   CHOLECYSTECTOMY     ENDOVENOUS ABLATION SAPHENOUS VEIN W/ LASER Left 01/01/2019   endovenous laser ablation left greater saphenous vein by Ruta Hinds MD    OSTECTOMY Right 04/03/2018   Procedure: OSTECTOMY RIGHT CALCANEUS;  Surgeon: Caprice Beaver, DPM;  Location: [facility];  Service: Podiatry;  Laterality: Right;   RIGHT SHOULDER SURGERY FOR TEAR AND SPUR - YRS AGO     SYNOVECTOMY  08/10/2011   Procedure: SYNOVECTOMY;  Surgeon: Marcheta Grammes, DPM;  Location: [facility];  Service: Orthopedics;  Laterality: Left;  Synovectomy of Peroneal Tendon Left Foot   TENDON REPAIR  08/10/2011   Procedure: TENDON REPAIR;  Surgeon: Marcheta Grammes, DPM;  Location: [facility];  Service: Orthopedics;  Laterality: Left;  Repair of Peroneal Tendon Left Foot   TENDON REPAIR Right 04/03/2018   Procedure: EXPLORATION OF PERONEAL TENDON WITH POSSIBLE REPAIR OF LONGITUDINAL TEAR OF THE PERONEAL TENDON RIGHT FOOT;  Surgeon: Caprice Beaver, DPM;  Location: [facility];  Service: Podiatry;  Laterality: Right;   TONSILLECTOMY     TOTAL HIP ARTHROPLASTY  03/29/2011   Procedure: TOTAL HIP ARTHROPLASTY;  Surgeon: Dione Plover Aluisio;  Location: [facility];  Service: Orthopedics;  Laterality: Right;    Family History  Problem Relation Age of Onset   Depression Mother    Cancer Mother        colon   Heart failure Mother    Colon  cancer Mother    Coronary artery disease Father    Hypertension Father     No Known Allergies  Current Outpatient Medications on File Prior to Visit  Medication Sig Dispense Refill   atorvastatin (LIPITOR) 20 MG tablet Take 1 tablet (20 mg total) by mouth daily. 90 tablet 3   blood glucose meter kit and supplies KIT Dispense based on patient and insurance preference. Use up to four times daily as directed. (FOR ICD-9 250.00, 250.01). 1 each 0   Blood Glucose Monitoring Suppl (ONETOUCH VERIO) w/Device KIT Use to test three times daily. 1 kit 0   Cholecalciferol (VITAMIN D3) 50 MCG (2000 UT) TABS Take 2,000 Units by mouth daily.     citalopram (CELEXA) 10 MG tablet TAKE ONE TABLET BY MOUTH EVERY MORNING 90 tablet 1   glucose blood (ONETOUCH VERIO) test strip Use to test three times daily. 300 each 3   Lancets (ONETOUCH ULTRASOFT) lancets Use to test three times daily. 300 each 3   lisinopril (ZESTRIL) 5 MG tablet TAKE ONE TABLET BY MOUTH EVERY MORNING 90 tablet 3   metFORMIN (GLUCOPHAGE) 1000 MG tablet TAKE ONE TABLET BY MOUTH TWICE DAILY 440 tablet 1   TRULICITY 3.47 QQ/5.9DG SOPN INJECT 0.4m into THE SKIN ONCE A WEEK 6 mL 1   No current facility-administered medications on file prior to visit.    BP 130/80 (BP  Location: Left Arm, Patient Position: Sitting, Cuff Size: Normal)   Pulse 69   Temp 98.9 F (37.2 C) (Oral)   Ht _0  (1.88 m)   Wt 224 lb 2 oz (101.7 kg)   SpO2 97%   BMI 28.78 kg/m       Objective:   Physical Exam Vitals and nursing note reviewed.  Constitutional:      General: He is not in acute distress.    Appearance: Normal appearance. He is well-developed and normal weight.  HENT:     Head: Normocephalic and atraumatic.     Right Ear: Tympanic membrane, ear canal and external ear normal. There is no impacted cerumen.     Left Ear: Tympanic membrane, ear canal and external ear normal. There is no impacted cerumen.     Nose: Nose normal. No congestion or rhinorrhea.     Mouth/Throat:     Mouth: Mucous membranes are moist.     Pharynx: Oropharynx is clear. No oropharyngeal exudate or posterior oropharyngeal erythema.  Eyes:     General:        Right eye: No discharge.        Left eye: No discharge.     Extraocular Movements: Extraocular movements intact.     Conjunctiva/sclera: Conjunctivae normal.     Pupils: Pupils are equal, round, and reactive to light.  Neck:     Vascular: No carotid bruit.     Trachea: No tracheal deviation.  Cardiovascular:     Rate and Rhythm: Normal rate and regular rhythm.     Pulses: Normal pulses.     Heart sounds: Normal heart sounds. No murmur heard.   No friction rub. No gallop.  Pulmonary:     Effort: Pulmonary effort is normal. No respiratory distress.     Breath sounds: Normal breath sounds. No stridor. No wheezing, rhonchi or rales.  Chest:     Chest wall: No tenderness.  Abdominal:     General: Bowel sounds are normal. There is no distension.     Palpations: Abdomen is soft. There is no mass.  Tenderness: There is no abdominal tenderness. There is no right CVA tenderness, left CVA tenderness, guarding or rebound.     Hernia: No hernia is present.  Musculoskeletal:        General: No swelling, tenderness, deformity or  signs of injury. Normal range of motion.     Right lower leg: No edema.     Left lower leg: No edema.  Lymphadenopathy:     Cervical: No cervical adenopathy.  Skin:    General: Skin is warm and dry.     Capillary Refill: Capillary refill takes less than 2 seconds.     Coloration: Skin is not jaundiced or pale.     Findings: No bruising, erythema, lesion or rash.  Neurological:     General: No focal deficit present.     Mental Status: He is alert and oriented to person, place, and time.     Cranial Nerves: No cranial nerve deficit.     Sensory: No sensory deficit.     Motor: No weakness.     Coordination: Coordination normal.     Gait: Gait normal.     Deep Tendon Reflexes: Reflexes normal.     Comments: Snapping, catching, and locking of the left middle finger during flexion.  During nodule noted at the base of the left middle finger.  Psychiatric:        Mood and Affect: Mood normal.        Behavior: Behavior normal.        Thought Content: Thought content normal.        Judgment: Judgment normal.      Assessment & Plan:   1. Routine general medical examination at a health care facility - Follow up in one year or sooner if needed - CBC with Differential/Platelet; Future - Comprehensive metabolic panel; Future - Hemoglobin A1c; Future - Lipid panel; Future - TSH; Future - TSH - Lipid panel - Hemoglobin A1c - Comprehensive metabolic panel - CBC with Differential/Platelet  2. Type 2 diabetes mellitus with other specified complication, without long-term current use of insulin ([place]) - Consider decreasing Metformin  - Continue with weight loss management  - CBC with Differential/Platelet; Future - Comprehensive metabolic panel; Future - Hemoglobin A1c; Future - Lipid panel; Future - TSH; Future - TSH - Lipid panel - Hemoglobin A1c - Comprehensive metabolic panel - CBC with Differential/Platelet  3. Essential hypertension - Well controlled. No change in medications   - CBC with Differential/Platelet; Future - Comprehensive metabolic panel; Future - Hemoglobin A1c; Future - Lipid panel; Future - TSH; Future - TSH - Lipid panel - Hemoglobin A1c - Comprehensive metabolic panel - CBC with Differential/Platelet  4. Mixed hyperlipidemia - Consider dose change of statin  - CBC with Differential/Platelet; Future - Comprehensive metabolic panel; Future - Hemoglobin A1c; Future - Lipid panel; Future - TSH; Future - TSH - Lipid panel - Hemoglobin A1c - Comprehensive metabolic panel - CBC with Differential/Platelet  5. BPH associated with nocturia  - PSA; Future - PSA  6. Need for immunization against influenza  - Flu Vaccine QUAD High Dose(Fluad)  7. Trigger middle finger of left hand - verbal consent obtained. Using betadine and alcohol for cleansing and cold spray for local anesthesia.  Using a insulin syringe, 20 mg of depo medrol was injected at the base of the left middle finger, superficial to flexor sheath. Immediate results noted. Patient tolerated procedure well.  - methylPREDNISolone acetate (DEPO-MEDROL) injection 40 mg   [facility]

## 2021-04-13 ENCOUNTER — Telehealth: Payer: Self-pay | Admitting: Pharmacist

## 2021-04-13 ENCOUNTER — Other Ambulatory Visit: Payer: Self-pay | Admitting: Adult Health

## 2021-04-13 DIAGNOSIS — I10 Essential (primary) hypertension: Secondary | ICD-10-CM

## 2021-04-13 DIAGNOSIS — E1169 Type 2 diabetes mellitus with other specified complication: Secondary | ICD-10-CM

## 2021-04-13 DIAGNOSIS — Z760 Encounter for issue of repeat prescription: Secondary | ICD-10-CM

## 2021-04-13 NOTE — Chronic Care Management (AMB) (Addendum)
Chronic Care Management Pharmacy Assistant   Name: Jonathan Calhoun  MRN: [id number] DOB: Jul 11, 1950  Reason for Encounter: Medication Review   Conditions to be addressed/monitored: HTN  Recent office visits:  03/22/21 [name], NP - Patient presented for Routine medical examination and other concerns. Stopped Citalopram and prednisolone  Recent consult visits:  None  Hospital visits:  None in previous 6 months  Medications: Outpatient Encounter Medications as of 04/13/2021  Medication Sig   atorvastatin (LIPITOR) 20 MG tablet Take 1 tablet (20 mg total) by mouth daily.   blood glucose meter kit and supplies KIT Dispense based on patient and insurance preference. Use up to four times daily as directed. (FOR ICD-9 250.00, 250.01).   Blood Glucose Monitoring Suppl (ONETOUCH VERIO) w/Device KIT Use to test three times daily.   Cholecalciferol (VITAMIN D3) 50 MCG (2000 UT) TABS Take 2,000 Units by mouth daily.   glucose blood (ONETOUCH VERIO) test strip Use to test three times daily.   Lancets (ONETOUCH ULTRASOFT) lancets Use to test three times daily.   lisinopril (ZESTRIL) 5 MG tablet TAKE ONE TABLET BY MOUTH EVERY MORNING   metFORMIN (GLUCOPHAGE) 1000 MG tablet TAKE ONE TABLET BY MOUTH TWICE DAILY   TRULICITY 9.35 TS/1.7BL SOPN INJECT 0.$RemoveBefor'75mg'[id number]$  into THE SKIN ONCE A WEEK   No facility-administered encounter medications on file as of 04/13/2021.   Reviewed chart prior to disease state call. Spoke with patient regarding BP  Recent Office Vitals: BP Readings from Last 3 Encounters:  03/22/21 130/80  12/28/20 124/80  06/30/20 128/80   Pulse Readings from Last 3 Encounters:  03/22/21 69  12/28/20 80  03/17/20 66    Wt Readings from Last 3 Encounters:  03/22/21 224 lb 2 oz (101.7 kg)  12/28/20 234 lb (106.1 kg)  06/30/20 241 lb (109.3 kg)     Kidney Function Lab Results  Component Value Date/Time   CREATININE 0.78 03/22/2021 10:00 AM   CREATININE 0.73 03/17/2020  08:17 AM   CREATININE 0.75 02/27/2019 07:43 AM   GFR 89.81 03/22/2021 10:00 AM   GFRNONAA 94 03/17/2020 08:17 AM   GFRAA 109 03/17/2020 08:17 AM    BMP Latest Ref Rng & Units 03/22/2021 03/17/2020 02/27/2019  Glucose 70 - 99 mg/dL 107(H) 127(H) 129(H)  BUN 6 - 23 mg/dL $Remove'12 12 11  'iRPTboW$ Creatinine 0.40 - 1.50 mg/dL 0.78 0.73 0.75  BUN/Creat Ratio 6 - 22 (calc) - NOT APPLICABLE -  Sodium 390 - 145 mEq/L 138 140 138  Potassium 3.5 - 5.1 mEq/L 4.6 5.0 4.7  Chloride 96 - 112 mEq/L 102 105 103  CO2 19 - 32 mEq/L $Remove'28 23 27  'rhYkrzH$ Calcium 8.4 - 10.5 mg/dL 9.8 9.6 9.7    Current antihypertensive regimen:  Lisinopril 5 mg daily  How often are you checking your Blood Pressure? infrequently Current home BP readings: Patent reports he is not checking often, Reports home average of 118/70 last in office reading was 130/80 on 03/22/21 What recent interventions/DTPs have been made by any provider to improve Blood Pressure control since last CPP Visit: Patient reports no changes Any recent hospitalizations or ED visits since last visit with CPP? No  Adherence Review: Is the patient currently on ACE/ARB medication? No Does the patient have >5 day gap between last estimated fill dates? No   Reviewed chart for medication changes ahead of medication coordination call.  No OVs, Consults, or hospital visits since last care coordination call/Pharmacist visit.   No medication changes indicated  BP Readings from Last 3 Encounters:  03/22/21 130/80  12/28/20 124/80  06/30/20 128/80    Lab Results  Component Value Date   HGBA1C 6.1 03/22/2021     Patient obtains medications through Adherence Packaging  30 Days   Last adherence delivery included:  Metformin 1000 mg: one tablet at breakfast and one at dinner Lisinopril 5 mg: one tablet at breakfast Citalopram 10 mg: one tablet at breakfast Vitamin D3 2,000 unit at breakfast Atorvastatin 20 mg: one tablet at breakfast Trulicity 2.44 MG- Inject into skin  weekly    Patient Declined the need for refills for  OneTouch Ultrasoft lancets: use to test three times a daily  OneTouch Verio test strips: use to test three times a daily   Patient is due for next adherence delivery on: 04/27/21. Called patient and reviewed medications and coordinated delivery. Confirmed Packaging for 30 DS  This delivery to include: Metformin 1000 mg: one tablet at breakfast and one at dinner Lisinopril 5 mg: one tablet at breakfast Vitamin D3 2,000 unit at breakfast Atorvastatin 20 mg: one tablet at breakfast Trulicity 6.28 MG- Inject into skin weekly    Patient Declined the need for refills for  OneTouch Ultrasoft lancets: use to test three times a daily  OneTouch Verio test strips: use to test three times a daily Citalopram (discontinued by provider)   Confirmed delivery date of 04/27/21, advised patient that pharmacy will contact them the morning of delivery.   Care Gaps: BP - 130/80 (03/22/21) AWV - 11/23 COVID Booster #3 Therapist, music) - Overdue Flu vaccine - Overdue Colonoscopy - Overdue CCM -2/23 Lab Results  Component Value Date   HGBA1C 6.1 03/22/2021    Star Rating Drugs: Dulaglutide (Trulicity) 6.38 mg - Last filled 10/312022 28 DS [facility] Metformin (Glucophage) 1000 mg - Last filled 03/21/2021 30 DS [facility] Lisinopril (Zestril) 5 mg - Last filled 03/21/2021 30 DS [facility] Atorvastatin (Lipitor) 20 mg - Last filled 03/21/2021 30 DS at [facility] Pharmacist Assistant [phone]

## 2021-05-05 ENCOUNTER — Ambulatory Visit (AMBULATORY_SURGERY_CENTER): Payer: PPO | Admitting: *Deleted

## 2021-05-05 ENCOUNTER — Other Ambulatory Visit: Payer: Self-pay

## 2021-05-05 ENCOUNTER — Encounter: Payer: Self-pay | Admitting: Gastroenterology

## 2021-05-05 VITALS — Ht 74.0 in | Wt 224.0 lb

## 2021-05-05 DIAGNOSIS — Z800 Family history of malignant neoplasm of digestive organs: Secondary | ICD-10-CM

## 2021-05-05 MED ORDER — NA SULFATE-K SULFATE-MG SULF 17.5-3.13-1.6 GM/177ML PO SOLN
1.0000 | ORAL | 0 refills | Status: DC
Start: 2021-05-05 — End: 2021-05-19

## 2021-05-05 NOTE — Progress Notes (Signed)
Patient's pre-visit was done today over the phone with the patient. Name,DOB and address verified. Patient denies any allergies to Eggs and Soy. Patient denies any problems with anesthesia/sedation. Patient is not taking any diet pills or blood thinners. No home Oxygen. Packet of Prep instructions mailed to patient including a copy of a consent form-pt is aware. Prep instructions sent to pt's MyChart (if activated).Patient understands to call us back with any questions or concerns. Patient is aware of our care-partner policy and Covid-19 safety protocol.  ° °EMMI education assigned to the patient for the procedure, sent to MyChart.  ° °The patient is COVID-19 vaccinated.   °

## 2021-05-13 ENCOUNTER — Other Ambulatory Visit: Payer: Self-pay | Admitting: Adult Health

## 2021-05-17 ENCOUNTER — Telehealth: Payer: Self-pay | Admitting: Pharmacist

## 2021-05-17 DIAGNOSIS — E1169 Type 2 diabetes mellitus with other specified complication: Secondary | ICD-10-CM

## 2021-05-17 NOTE — Chronic Care Management (AMB) (Signed)
Chronic Care Management Pharmacy Assistant   Name: Jonathan Calhoun  MRN: [id number] DOB: [birth year]   Reason for Encounter: Medication Review/ Medication Coordination   Conditions to be addressed/monitored: HTN  Recent office visits:  None   Recent consult visits:  05/05/21 Levonne Spiller, RN - Patient presented for Colonoscopy. Prescribed Na Sulfate-K Sulfate-Mg [facility] visits:  None in previous 6 months  Medications: Outpatient Encounter Medications as of 05/17/2021  Medication Sig   atorvastatin (LIPITOR) 20 MG tablet TAKE ONE TABLET BY MOUTH EVERY MORNING   blood glucose meter kit and supplies KIT Dispense based on patient and insurance preference. Use up to four times daily as directed. (FOR ICD-9 250.00, 250.01).   Blood Glucose Monitoring Suppl (ONETOUCH VERIO) w/Device KIT Use to test three times daily.   Cholecalciferol (VITAMIN D3) 50 MCG (2000 UT) TABS Take 2,000 Units by mouth daily.   glucose blood (ONETOUCH VERIO) test strip Use to test three times daily.   Lancets (ONETOUCH ULTRASOFT) lancets Use to test three times daily.   lisinopril (ZESTRIL) 5 MG tablet TAKE ONE TABLET BY MOUTH EVERY MORNING   metFORMIN (GLUCOPHAGE) 1000 MG tablet TAKE ONE TABLET BY MOUTH TWICE DAILY   Na Sulfate-K Sulfate-Mg Sulf 17.5-3.13-1.6 GM/177ML SOLN Take 1 kit by mouth as directed. May use generic Suprep   TRULICITY 6.20 BT/5.9RC SOPN INJECT 0.$RemoveBefor'75mg'emwpVYiQTqog$  into THE SKIN ONCE A WEEK   No facility-administered encounter medications on file as of 05/17/2021.  Reviewed chart prior to disease state call. Spoke with patient regarding BP  Recent Office Vitals: BP Readings from Last 3 Encounters:  03/22/21 130/80  12/28/20 124/80  06/30/20 128/80   Pulse Readings from Last 3 Encounters:  03/22/21 69  12/28/20 80  03/17/20 66    Wt Readings from Last 3 Encounters:  05/05/21 224 lb (101.6 kg)  03/22/21 224 lb 2 oz (101.7 kg)  12/28/20 234 lb (106.1 kg)     Kidney  Function Lab Results  Component Value Date/Time   CREATININE 0.78 03/22/2021 10:00 AM   CREATININE 0.73 03/17/2020 08:17 AM   CREATININE 0.75 02/27/2019 07:43 AM   GFR 89.81 03/22/2021 10:00 AM   GFRNONAA 94 03/17/2020 08:17 AM   GFRAA 109 03/17/2020 08:17 AM    BMP Latest Ref Rng & Units 03/22/2021 03/17/2020 02/27/2019  Glucose 70 - 99 mg/dL 107(H) 127(H) 129(H)  BUN 6 - 23 mg/dL $Remove'12 12 11  'ufupTxX$ Creatinine 0.40 - 1.50 mg/dL 0.78 0.73 0.75  BUN/Creat Ratio 6 - 22 (calc) - NOT APPLICABLE -  Sodium 163 - 145 mEq/L 138 140 138  Potassium 3.5 - 5.1 mEq/L 4.6 5.0 4.7  Chloride 96 - 112 mEq/L 102 105 103  CO2 19 - 32 mEq/L $Remove'28 23 27  'wApIamS$ Calcium 8.4 - 10.5 mg/dL 9.8 9.6 9.7    Current antihypertensive regimen:  Lisinopril 5 mg daily  How often are you checking your Blood Pressure? infrequently Current home BP readings:  BP Readings from Last 3 Encounters:  03/22/21 130/80  12/28/20 124/80  06/30/20 128/80     Recent Relevant Labs: Lab Results  Component Value Date/Time   HGBA1C 6.1 03/22/2021 10:00 AM   HGBA1C 5.9 (A) 12/28/2020 10:01 AM   HGBA1C 6.4 06/30/2020 09:21 AM   HGBA1C 6.3 (H) 03/17/2020 08:17 AM   HGBA1C 5.9 10/07/2019 07:11 AM   MICROALBUR 0.7 01/25/2016 08:57 AM   MICROALBUR 1.7 09/14/2015 08:42 AM    Kidney Function Lab Results  Component Value Date/Time  CREATININE 0.78 03/22/2021 10:00 AM   CREATININE 0.73 03/17/2020 08:17 AM   CREATININE 0.75 02/27/2019 07:43 AM   GFR 89.81 03/22/2021 10:00 AM   GFRNONAA 94 03/17/2020 08:17 AM   GFRAA 109 03/17/2020 08:17 AM    Reviewed chart for medication changes ahead of medication coordination call.  No OVs, Consults, or hospital visits since last care coordination call/Pharmacist visit.   No medication changes indicated   BP Readings from Last 3 Encounters:  03/22/21 130/80  12/28/20 124/80  06/30/20 128/80    Lab Results  Component Value Date   HGBA1C 6.1 03/22/2021     Patient obtains medications through  Adherence Packaging  30 Days   Last adherence delivery included:  Metformin 1000 mg: one tablet at breakfast and one at dinner Lisinopril 5 mg: one tablet at breakfast Vitamin D3 2,000 unit at breakfast Atorvastatin 20 mg: one tablet at breakfast Trulicity 3.78 MG- Inject into skin weekly      Patient Declined the need for refills for  OneTouch Ultrasoft lancets: use to test three times a daily  OneTouch Verio test strips: use to test three times a daily Citalopram (discontinued by provider)  Patient is due for next adherence delivery on: 05/26/21. Called patient and reviewed medications and coordinated delivery. Confirmed Packaging for 30 DS  This delivery to include: Metformin 1000 mg: one tablet at breakfast and one at dinner Lisinopril 5 mg: one tablet at breakfast Vitamin D3 2,000 unit at breakfast Atorvastatin 20 mg: one tablet at breakfast Trulicity 5.88 MG- Inject into skin weekly    Patient declined the following  due to on hand supply OneTouch Ultrasoft lancets: use to test three times a daily  OneTouch Verio test strips: use to test three times a daily    Confirmed delivery date of 05/26/21 advised patient that pharmacy will contact them the morning of delivery.   Care Gaps: BP - 130/80 (03/22/21) AWV - 11/23 Zoster Vaccine - Overdue COVID Booster #3 Therapist, music) - Overdue Foot Exam - Overdue Colonoscopy - Overdue CCM -2/23 Lab Results  Component Value Date   HGBA1C 6.1 03/22/2021    Star Rating Drugs: Dulaglutide (Trulicity) 5.02 mg - Last filled 04/20/2021 28 DS at [facility] Metformin (Glucophage) 1000 mg - Last filled 04/20/2021 30 DS at [facility] Lisinopril (Zestril) 5 mg - Last filled 04/20/2021 30 DS at [facility] Atorvastatin (Lipitor) 20 mg - Last filled 04/20/2021 30 DS at [place] Pharmacist Assistant [phone]

## 2021-05-18 MED ORDER — TRULICITY 0.75 MG/0.5ML ~~LOC~~ SOAJ: SUBCUTANEOUS | 2 refills | Status: DC

## 2021-05-18 NOTE — Telephone Encounter (Addendum)
-----   Message from Viona Gilmore, [facility] sent at 05/17/2021 12:52 PM EST ----- Regarding: Trulicity refill   Hi,  Can you please send a refill of Trulicity to [facility]?   Thank you! Maddie

## 2021-05-18 NOTE — Telephone Encounter (Addendum)
Rx sent. Okayed by [employer]

## 2021-05-19 ENCOUNTER — Other Ambulatory Visit: Payer: Self-pay

## 2021-05-19 ENCOUNTER — Ambulatory Visit (AMBULATORY_SURGERY_CENTER): Payer: PPO | Admitting: Gastroenterology

## 2021-05-19 ENCOUNTER — Encounter: Payer: Self-pay | Admitting: Gastroenterology

## 2021-05-19 VITALS — BP 121/79 | HR 61 | Temp 98.6°F | Resp 13 | Ht 74.0 in | Wt 224.0 lb

## 2021-05-19 DIAGNOSIS — F329 Major depressive disorder, single episode, unspecified: Secondary | ICD-10-CM | POA: Diagnosis not present

## 2021-05-19 DIAGNOSIS — Z800 Family history of malignant neoplasm of digestive organs: Secondary | ICD-10-CM | POA: Diagnosis not present

## 2021-05-19 DIAGNOSIS — I10 Essential (primary) hypertension: Secondary | ICD-10-CM | POA: Diagnosis not present

## 2021-05-19 DIAGNOSIS — Z1211 Encounter for screening for malignant neoplasm of colon: Secondary | ICD-10-CM

## 2021-05-19 DIAGNOSIS — E119 Type 2 diabetes mellitus without complications: Secondary | ICD-10-CM | POA: Diagnosis not present

## 2021-05-19 DIAGNOSIS — F419 Anxiety disorder, unspecified: Secondary | ICD-10-CM | POA: Diagnosis not present

## 2021-05-19 MED ORDER — SODIUM CHLORIDE 0.9 % IV SOLN: 500.0000 mL | Freq: Once | INTRAVENOUS | Status: DC

## 2021-05-19 NOTE — Progress Notes (Signed)
VS-CW  Pt's states no medical or surgical changes since previsit or office visit.  

## 2021-05-19 NOTE — Op Note (Signed)
[facility] Patient Name: Jonathan Calhoun Procedure Date: 05/19/2021 10:12 AM MRN: [id number] Endoscopist: Sherilyn Cooter L. Myrtie Neither , MD Age: 70 Referring MD:  Date of Birth: 1950/09/25 Gender: Male Account #: [id number] Procedure:                Colonoscopy Indications:              Colon cancer screening in patient at increased                            risk: Colorectal cancer in mother                           No polyps last colonoscopy 12/2015 Medicines:                Monitored Anesthesia Care Procedure:                Pre-Anesthesia Assessment:                           - Prior to the procedure, a History and Physical                            was performed, and patient medications and                            allergies were reviewed. The patient's tolerance of                            previous anesthesia was also reviewed. The risks                            and benefits of the procedure and the sedation                            options and risks were discussed with the patient.                            All questions were answered, and informed consent                            was obtained. Prior Anticoagulants: The patient has                            taken no previous anticoagulant or antiplatelet                            agents. ASA Grade Assessment: II - A patient with                            mild systemic disease. After reviewing the risks                            and benefits, the patient was deemed in  satisfactory condition to undergo the procedure.                           After obtaining informed consent, the colonoscope                            was passed under direct vision. Throughout the                            procedure, the patient's blood pressure, pulse, and                            oxygen saturations were monitored continuously. The                            Olympus CF-HQ190L ([id number]) Colonoscope was                             introduced through the anus and advanced to the the                            cecum, identified by appendiceal orifice and                            ileocecal valve. The colonoscopy was performed                            without difficulty. The patient tolerated the                            procedure well. The quality of the bowel                            preparation was good. The ileocecal valve,                            appendiceal orifice, and rectum were photographed. Scope In: 11:14:27 AM Scope Out: 11:28:49 AM Scope Withdrawal Time: 0 hours 10 minutes 13 seconds  Total Procedure Duration: 0 hours 14 minutes 22 seconds  Findings:                 The perianal and digital rectal examinations were                            normal.                           Repeat examination of right colon under NBI                            performed.                           A single diverticulum was found in the right colon.  Internal hemorrhoids were found.                           The exam was otherwise without abnormality on                            direct and retroflexion views. Complications:            No immediate complications. Estimated Blood Loss:     Estimated blood loss: none. Impression:               - Diverticulosis in the right colon.                           - Internal hemorrhoids.                           - The examination was otherwise normal on direct                            and retroflexion views.                           - No specimens collected. Recommendation:           - Patient has a contact number available for                            emergencies. The signs and symptoms of potential                            delayed complications were discussed with the                            patient. Return to normal activities tomorrow.                            Written discharge instructions were  provided to the                            patient.                           - Resume previous diet.                           - Continue present medications.                           - Repeat colonoscopy in 5 years for screening                            purposes. Vernice Mannina L. Myrtie Neither, MD 05/19/2021 11:34:22 AM This report has been signed electronically.

## 2021-05-19 NOTE — Progress Notes (Signed)
History and Physical:  This patient presents for endoscopic testing for: Encounter Diagnosis  Name Primary?   Family history of colon cancer Yes    No polyps 2017 Patient denies chronic abdominal pain, rectal bleeding, constipation or diarrhea.   ROS: Patient denies chest pain or cough   Past Medical History: Past Medical History:  Diagnosis Date   Allergy    Anxiety    Depression    Diabetes mellitus    ORAL MEDS-NO INSULIN   History of alcoholism ([place])    History of shingles    Hyperlipidemia    Hypertension    Ileus, postoperative ([place])    Post-op Chole Surgery     Past Surgical History: Past Surgical History:  Procedure Laterality Date   BONE EXOSTOSIS EXCISION  08/10/2011   Procedure: EXOSTOSIS EXCISION;  Surgeon: Marcheta Grammes, DPM;  Location: [facility];  Service: Orthopedics;  Laterality: Left;  Exostectomy of Calcaneous Left Foot   CARDIAC CATHETERIZATION  2002   CHOLECYSTECTOMY     COLONOSCOPY  12/21/2015   Dr.Danis   ENDOVENOUS ABLATION SAPHENOUS VEIN W/ LASER Left 01/01/2019   endovenous laser ablation left greater saphenous vein by Ruta Hinds MD    OSTECTOMY Right 04/03/2018   Procedure: OSTECTOMY RIGHT CALCANEUS;  Surgeon: Caprice Beaver, DPM;  Location: [facility];  Service: Podiatry;  Laterality: Right;   RIGHT SHOULDER SURGERY FOR TEAR AND SPUR - YRS AGO     SYNOVECTOMY  08/10/2011   Procedure: SYNOVECTOMY;  Surgeon: Marcheta Grammes, DPM;  Location: [facility];  Service: Orthopedics;  Laterality: Left;  Synovectomy of Peroneal Tendon Left Foot   TENDON REPAIR  08/10/2011   Procedure: TENDON REPAIR;  Surgeon: Marcheta Grammes, DPM;  Location: [facility];  Service: Orthopedics;  Laterality: Left;  Repair of Peroneal Tendon Left Foot   TENDON REPAIR Right 04/03/2018   Procedure: EXPLORATION OF PERONEAL TENDON WITH POSSIBLE REPAIR OF LONGITUDINAL TEAR OF THE PERONEAL TENDON RIGHT FOOT;  Surgeon: Caprice Beaver, DPM;  Location: [facility];   Service: Podiatry;  Laterality: Right;   TONSILLECTOMY     TOTAL HIP ARTHROPLASTY  03/29/2011   Procedure: TOTAL HIP ARTHROPLASTY;  Surgeon: Dione Plover Aluisio;  Location: [facility];  Service: Orthopedics;  Laterality: Right;    Allergies: No Known Allergies  Outpatient Meds: Current Outpatient Medications  Medication Sig Dispense Refill   atorvastatin (LIPITOR) 20 MG tablet TAKE ONE TABLET BY MOUTH EVERY MORNING 90 tablet 3   blood glucose meter kit and supplies KIT Dispense based on patient and insurance preference. Use up to four times daily as directed. (FOR ICD-9 250.00, 250.01). 1 each 0   Blood Glucose Monitoring Suppl (ONETOUCH VERIO) w/Device KIT Use to test three times daily. 1 kit 0   Cholecalciferol (VITAMIN D3) 50 MCG (2000 UT) TABS Take 2,000 Units by mouth daily.     Dulaglutide (TRULICITY) 9.62 EZ/6.6QH SOPN INJECT 0.$RemoveBefor'75mg'uKMWfxyvHxBs$  into THE SKIN ONCE A WEEK 6 mL 2   glucose blood (ONETOUCH VERIO) test strip Use to test three times daily. 300 each 3   Lancets (ONETOUCH ULTRASOFT) lancets Use to test three times daily. 300 each 3   lisinopril (ZESTRIL) 5 MG tablet TAKE ONE TABLET BY MOUTH EVERY MORNING 90 tablet 3   metFORMIN (GLUCOPHAGE) 1000 MG tablet TAKE ONE TABLET BY MOUTH TWICE DAILY 180 tablet 3   Current Facility-Administered Medications  Medication Dose Route Frequency Provider Last Rate Last Admin   0.9 %  sodium chloride infusion  500 mL Intravenous Once  [name], MD          ___________________________________________________________________ Objective   Exam:  BP 110/81    Pulse 65    Temp 98.6 F (37 C)    Ht $R'6\' 2"'MN$  (1.88 m)    Wt 224 lb (101.6 kg)    SpO2 98%    BMI 28.76 kg/m   CV: RRR without murmur, S1/S2 Resp: clear to auscultation bilaterally, normal RR and effort noted GI: soft, no tenderness, with active bowel sounds.   Assessment: Encounter Diagnosis  Name Primary?   Family history of colon cancer Yes     Plan: Colonoscopy  The benefits  and risks of the planned procedure were described in detail with the patient or (when appropriate) their health care proxy.  Risks were outlined as including, but not limited to, bleeding, infection, perforation, adverse medication reaction leading to cardiac or pulmonary decompensation, pancreatitis (if ERCP).  The limitation of incomplete mucosal visualization was also discussed.  No guarantees or warranties were given.    The patient is appropriate for an endoscopic procedure in the ambulatory setting.   - Wilfrid Lund, MD

## 2021-05-19 NOTE — Progress Notes (Signed)
PT taken to PACU. Monitors in place. VSS. Report given to RN. 

## 2021-05-19 NOTE — Patient Instructions (Signed)
Handouts on Diverticulosis and Hemorrhoids given.  YOU HAD AN ENDOSCOPIC PROCEDURE TODAY AT [facility]:   Refer to the procedure report that was given to you for any specific questions about what was found during the examination.  If the procedure report does not answer your questions, please call your gastroenterologist to clarify.  If you requested that your care partner not be given the details of your procedure findings, then the procedure report has been included in a sealed envelope for you to review at your convenience later.  YOU SHOULD EXPECT: Some feelings of bloating in the abdomen. Passage of more gas than usual.  Walking can help get rid of the air that was put into your GI tract during the procedure and reduce the bloating. If you had a lower endoscopy (such as a colonoscopy or flexible sigmoidoscopy) you may notice spotting of blood in your stool or on the toilet paper. If you underwent a bowel prep for your procedure, you may not have a normal bowel movement for a few days.  Please Note:  You might notice some irritation and congestion in your nose or some drainage.  This is from the oxygen used during your procedure.  There is no need for concern and it should clear up in a day or so.  SYMPTOMS TO REPORT IMMEDIATELY:  Following lower endoscopy (colonoscopy or flexible sigmoidoscopy):  Excessive amounts of blood in the stool  Significant tenderness or worsening of abdominal pains  Swelling of the abdomen that is new, acute  Fever of 100F or higher  For urgent or emergent issues, a gastroenterologist can be reached at any hour by calling [phone]. Do not use MyChart messaging for urgent concerns.    DIET:  We do recommend a small meal at first, but then you may proceed to your regular diet.  Drink plenty of fluids but you should avoid alcoholic beverages for 24 hours.  ACTIVITY:  You should plan to take it easy for the rest of today and you should NOT  DRIVE or use heavy machinery until tomorrow (because of the sedation medicines used during the test).    FOLLOW UP: Our staff will call the number listed on your records 48-72 hours following your procedure to check on you and address any questions or concerns that you may have regarding the information given to you following your procedure. If we do not reach you, we will leave a message.  We will attempt to reach you two times.  During this call, we will ask if you have developed any symptoms of COVID 19. If you develop any symptoms (ie: fever, flu-like symptoms, shortness of breath, cough etc.) before then, please call [phone].  If you test positive for Covid 19 in the 2 weeks post procedure, please call and report this information to [place].    If any biopsies were taken you will be contacted by phone or by letter within the next 1-3 weeks.  Please call us at [phone] if you have not heard about the biopsies in 3 weeks.    SIGNATURES/CONFIDENTIALITY: You and/or your care partner have signed paperwork which will be entered into your electronic medical record.  These signatures attest to the fact that that the information above on your After Visit Summary has been reviewed and is understood.  Full responsibility of the confidentiality of this discharge information lies with you and/or your care-partner.

## 2021-05-24 ENCOUNTER — Telehealth: Payer: Self-pay | Admitting: Adult Health

## 2021-05-24 NOTE — Telephone Encounter (Signed)
Left message for patient to call back and schedule Medicare Annual Wellness Visit (AWV) either virtually or in office. Left  my Herbie Drape number [phone]   Last AWV 05/04/20 ; please schedule at anytime with [facility] Nurse Health Advisor 1 or 2   This should be a 45 minute visit.

## 2021-05-24 NOTE — Telephone Encounter (Signed)
error 

## 2021-05-25 ENCOUNTER — Telehealth: Payer: Self-pay

## 2021-05-25 NOTE — Telephone Encounter (Signed)
°  Follow up Call-  Call back number 05/19/2021  Post procedure Call Back phone  # [phone]  Permission to leave phone message Yes  Some recent data might be hidden     Patient questions:  Do you have a fever, pain , or abdominal swelling? No. Pain Score  0 *  Have you tolerated food without any problems? Yes.    Have you been able to return to your normal activities? Yes.    Do you have any questions about your discharge instructions: Diet   No. Medications  No. Follow up visit  No.  Do you have questions or concerns about your Care? No.  Actions: * If pain score is 4 or above: No action needed, pain <4.   Have you developed a fever since your procedure? no  2.   Have you had an respiratory symptoms (SOB or cough) since your procedure? no  3.   Have you tested positive for COVID 19 since your procedure no  4.   Have you had any family members/close contacts diagnosed with the COVID 19 since your procedure?  no   If yes to any of these questions please route to Joylene John, RN and Joella Prince, RN

## 2021-05-31 ENCOUNTER — Ambulatory Visit (INDEPENDENT_AMBULATORY_CARE_PROVIDER_SITE_OTHER): Payer: PPO

## 2021-05-31 VITALS — Ht 74.0 in | Wt 224.0 lb

## 2021-05-31 DIAGNOSIS — Z0000 Encounter for general adult medical examination without abnormal findings: Secondary | ICD-10-CM | POA: Diagnosis not present

## 2021-05-31 NOTE — Patient Instructions (Addendum)
[name] , Thank you for taking time to come for your Medicare Wellness Visit. I appreciate your ongoing commitment to your health goals. Please review the following plan we discussed and let me know if I can assist you in the future.   These are the goals we discussed:  Goals       Chronic Care Management      CARE PLAN ENTRY (see longitudinal plan of care for additional care plan information)  Current Barriers:  Chronic Disease Management support, education, and care coordination needs related to Hypertension, Hyperlipidemia, Diabetes, Osteoarthritis and Allergic Rhinitis    Hypertension BP Readings from Last 3 Encounters:  03/17/20 120/80  10/07/19 120/72  04/30/19 131/76  Pharmacist Clinical Goal(s): Over the next 120 days, patient will work with PharmD and providers to maintain BP goal <130/80 Current regimen:  Lisinopril 5 mg daily Interventions: Discussed low salt diet and exercising as tolerated extensively Patient self care activities - Over the next 120 days, patient will: Check blood pressure 1-2 times weekly, document, and provide at future appointments Ensure daily salt intake < 2300 mg/day  Hyperlipidemia Lab Results  Component Value Date/Time   LDLCALC 70 03/17/2020 08:17 AM  Pharmacist Clinical Goal(s): Over the next 90 days, patient will work with PharmD and providers to maintain LDL goal < 70 Current regimen:  Atorvastatin 20 mg daily Interventions: Discussed how triglycerides can increase when: Eating eat too much food Eating high-fat foods such as fried foods, red meat, chicken skin, egg yolks, high-fat dairy, butter, lard, shortening, margarine, and fast food Eating foods high in simple carbohydrates such as fresh and canned fruit, candy, ice cream and sweetened yogurt, sweetened drinks like juices, cereal, jams, foods and drinks with corn syrup, or sugar listed as the first ingredient Drinking alcohol   Discussed recommendations for moderate aerobic  exercise for 150 minutes/week spread out over 5 days for heart healthy lifestyle Patient self care activities - Over the next 120 days, patient will: Continue current medication Try drinking a protein shake (Glucerna) during the middle of the day to avoid overeating at dinner  Diabetes Lab Results  Component Value Date/Time   HGBA1C 6.3 (H) 03/17/2020 08:17 AM   HGBA1C 5.9 10/07/2019 07:11 AM   HGBA1C 6.3 02/27/2019 07:43 AM   HGBA1C 5.9 05/30/2018 09:29 AM  Pharmacist Clinical Goal(s): Over the next 120 days, patient will work with PharmD and providers to maintain A1c goal <7% Current regimen:  Trulicity 1.70 mg inject once weekly Metformin 1000 mg twice daily  Interventions: Discussed carbohydrate counting and exercising as tolerated extensively Discussed blood sugar monitoring and rotating through different times Patient self care activities - Over the next 120 days, patient will: Check blood sugar once daily, document, and provide at future appointments Contact provider with any episodes of hypoglycemia  Medication management Pharmacist Clinical Goal(s): Over the next 120 days, patient will work with PharmD and providers to maintain optimal medication adherence Current pharmacy: [facility] Interventions Comprehensive medication review performed. Utilize [facility] pharmacy for medication synchronization, packaging and delivery Patient self care activities - Over the next 120 days, patient will: Take medications as prescribed Report any questions or concerns to PharmD and/or provider(s)      Increase physical activity      Start Silver Sneakers 3 days weekly.      Quit smoking / using tobacco      Planned quit date: December 31, 2016      Weight (lb) < 220 lb (99.8 kg) (pt-stated)  Goal weight: 220 lbs by next year        This is a list of the screening recommended for you and due dates:  Health Maintenance  Topic Date Due   COVID-19 Vaccine (3 - Booster  for Pfizer series) 06/16/2021*   Zoster (Shingles) Vaccine (1 of 2) 08/29/2021*   Hemoglobin A1C  09/19/2021   Eye exam for diabetics  01/05/2022   Tetanus Vaccine  01/28/2022   Complete foot exam   03/22/2022   Colon Cancer Screening  05/19/2026   Pneumonia Vaccine  Completed   Flu Shot  Completed   Hepatitis C Screening: USPSTF Recommendation to screen - Ages 18-79 yo.  Completed   HPV Vaccine  Aged Out  *Topic was postponed. The date shown is not the original due date.    Advanced directives: Yes  Conditions/risks identified: None  Next appointment: Follow up in one year for your annual wellness visit.   Preventive Care 72 Years and Older, Male Preventive care refers to lifestyle choices and visits with your health care provider that can promote health and wellness. What does preventive care include? A yearly physical exam. This is also called an annual well check. Dental exams once or twice a year. Routine eye exams. Ask your health care provider how often you should have your eyes checked. Personal lifestyle choices, including: Daily care of your teeth and gums. Regular physical activity. Eating a healthy diet. Avoiding tobacco and drug use. Limiting alcohol use. Practicing safe sex. Taking low doses of aspirin every day. Taking vitamin and mineral supplements as recommended by your health care provider. What happens during an annual well check? The services and screenings done by your health care provider during your annual well check will depend on your age, overall health, lifestyle risk factors, and family history of disease. Counseling  Your health care provider may ask you questions about your: Alcohol use. Tobacco use. Drug use. Emotional well-being. Home and relationship well-being. Sexual activity. Eating habits. History of falls. Memory and ability to understand (cognition). Work and work Statistician. Screening  You may have the following tests or  measurements: Height, weight, and BMI. Blood pressure. Lipid and cholesterol levels. These may be checked every 5 years, or more frequently if you are over 72 years old. Skin check. Lung cancer screening. You may have this screening every year starting at age 44 if you have a 30-pack-year history of smoking and currently smoke or have quit within the past 15 years. Fecal occult blood test (FOBT) of the stool. You may have this test every year starting at age 17. Flexible sigmoidoscopy or colonoscopy. You may have a sigmoidoscopy every 5 years or a colonoscopy every 10 years starting at age 4. Prostate cancer screening. Recommendations will vary depending on your family history and other risks. Hepatitis C blood test. Hepatitis B blood test. Sexually transmitted disease (STD) testing. Diabetes screening. This is done by checking your blood sugar (glucose) after you have not eaten for a while (fasting). You may have this done every 1-3 years. Abdominal aortic aneurysm (AAA) screening. You may need this if you are a current or former smoker. Osteoporosis. You may be screened starting at age 60 if you are at high risk. Talk with your health care provider about your test results, treatment options, and if necessary, the need for more tests. Vaccines  Your health care provider may recommend certain vaccines, such as: Influenza vaccine. This is recommended every year. Tetanus, diphtheria, and acellular pertussis (Tdap,  Td) vaccine. You may need a Td booster every 10 years. Zoster vaccine. You may need this after age 43. Pneumococcal 13-valent conjugate (PCV13) vaccine. One dose is recommended after age 91. Pneumococcal polysaccharide (PPSV23) vaccine. One dose is recommended after age 67. Talk to your health care provider about which screenings and vaccines you need and how often you need them. This information is not intended to replace advice given to you by your health care provider. Make sure  you discuss any questions you have with your health care provider. Document Released: 06/04/2015 Document Revised: 01/26/2016 Document Reviewed: 03/09/2015 Elsevier Interactive Patient Education  2017 [place] Prevention in the Home Falls can cause injuries. They can happen to people of all ages. There are many things you can do to make your home safe and to help prevent falls. What can I do on the outside of my home? Regularly fix the edges of walkways and driveways and fix any cracks. Remove anything that might make you trip as you walk through a door, such as a raised step or threshold. Trim any bushes or trees on the path to your home. Use bright outdoor lighting. Clear any walking paths of anything that might make someone trip, such as rocks or tools. Regularly check to see if handrails are loose or broken. Make sure that both sides of any steps have handrails. Any raised decks and porches should have guardrails on the edges. Have any leaves, snow, or ice cleared regularly. Use sand or salt on walking paths during winter. Clean up any spills in your garage right away. This includes oil or grease spills. What can I do in the bathroom? Use night lights. Install grab bars by the toilet and in the tub and shower. Do not use towel bars as grab bars. Use non-skid mats or decals in the tub or shower. If you need to sit down in the shower, use a plastic, non-slip stool. Keep the floor dry. Clean up any water that spills on the floor as soon as it happens. Remove soap buildup in the tub or shower regularly. Attach bath mats securely with double-sided non-slip rug tape. Do not have throw rugs and other things on the floor that can make you trip. What can I do in the bedroom? Use night lights. Make sure that you have a light by your bed that is easy to reach. Do not use any sheets or blankets that are too big for your bed. They should not hang down onto the floor. Have a firm  chair that has side arms. You can use this for support while you get dressed. Do not have throw rugs and other things on the floor that can make you trip. What can I do in the kitchen? Clean up any spills right away. Avoid walking on wet floors. Keep items that you use a lot in easy-to-reach places. If you need to reach something above you, use a strong step stool that has a grab bar. Keep electrical cords out of the way. Do not use floor polish or wax that makes floors slippery. If you must use wax, use non-skid floor wax. Do not have throw rugs and other things on the floor that can make you trip. What can I do with my stairs? Do not leave any items on the stairs. Make sure that there are handrails on both sides of the stairs and use them. Fix handrails that are broken or loose. Make sure that handrails are as  long as the stairways. Check any carpeting to make sure that it is firmly attached to the stairs. Fix any carpet that is loose or worn. Avoid having throw rugs at the top or bottom of the stairs. If you do have throw rugs, attach them to the floor with carpet tape. Make sure that you have a light switch at the top of the stairs and the bottom of the stairs. If you do not have them, ask someone to add them for you. What else can I do to help prevent falls? Wear shoes that: Do not have high heels. Have rubber bottoms. Are comfortable and fit you well. Are closed at the toe. Do not wear sandals. If you use a stepladder: Make sure that it is fully opened. Do not climb a closed stepladder. Make sure that both sides of the stepladder are locked into place. Ask someone to hold it for you, if possible. Clearly mark and make sure that you can see: Any grab bars or handrails. First and last steps. Where the edge of each step is. Use tools that help you move around (mobility aids) if they are needed. These include: Canes. Walkers. Scooters. Crutches. Turn on the lights when you go  into a dark area. Replace any light bulbs as soon as they burn out. Set up your furniture so you have a clear path. Avoid moving your furniture around. If any of your floors are uneven, fix them. If there are any pets around you, be aware of where they are. Review your medicines with your doctor. Some medicines can make you feel dizzy. This can increase your chance of falling. Ask your doctor what other things that you can do to help prevent falls. This information is not intended to replace advice given to you by your health care provider. Make sure you discuss any questions you have with your health care provider. Document Released: 03/04/2009 Document Revised: 10/14/2015 Document Reviewed: 06/12/2014 Elsevier Interactive Patient Education  2017 [employer].

## 2021-05-31 NOTE — Progress Notes (Signed)
Subjective:   HUSTON STONEHOCKER is a 71 y.o. male who presents for Medicare Annual/Subsequent preventive examination.  Review of Systems    No ROS Cardiac Risk Factors include: advanced age (>33men, >93 women);diabetes mellitus;hypertension    Objective:    Today's Vitals   05/31/21 1115  Weight: 224 lb (101.6 kg)  Height: $Remove'6\' 2"'oLHkKqP$  (1.88 m)   Body mass index is 28.76 kg/m.  Advanced Directives 05/31/2021 05/04/2020 07/02/2018 04/03/2018 04/02/2018 11/15/2016 12/16/2015  Does Patient Have a Medical Advance Directive? Yes No Yes Yes Yes Yes Yes  Type of Paramedic of [place];Living will - [place];Living will [place];Living will Healthcare Power of [place];Living will [place];Living will  Does patient want to make changes to medical advance directive? No - Patient declined - No - Patient declined No - Patient declined No - Patient declined - -  Copy of [place] in Chart? No - copy requested - - No - copy requested No - copy requested No - copy requested No - copy requested  Would patient like information on creating a medical advance directive? - No - Patient declined - No - Patient declined No - Patient declined - -  Pre-existing out of facility DNR order (yellow form or pink MOST form) - - - - - - -    Current Medications (verified) Outpatient Encounter Medications as of 05/31/2021  Medication Sig   atorvastatin (LIPITOR) 20 MG tablet TAKE ONE TABLET BY MOUTH EVERY MORNING   blood glucose meter kit and supplies KIT Dispense based on patient and insurance preference. Use up to four times daily as directed. (FOR ICD-9 250.00, 250.01).   Blood Glucose Monitoring Suppl (ONETOUCH VERIO) w/Device KIT Use to test three times daily.   Cholecalciferol (VITAMIN D3) 50 MCG (2000 UT) TABS Take 2,000 Units by mouth daily.   Dulaglutide (TRULICITY) 6.01 UX/3.2TF SOPN  INJECT 0.$RemoveB'75mg'uTcPrQbK$  into THE SKIN ONCE A WEEK   glucose blood (ONETOUCH VERIO) test strip Use to test three times daily.   Lancets (ONETOUCH ULTRASOFT) lancets Use to test three times daily.   lisinopril (ZESTRIL) 5 MG tablet TAKE ONE TABLET BY MOUTH EVERY MORNING   metFORMIN (GLUCOPHAGE) 1000 MG tablet TAKE ONE TABLET BY MOUTH TWICE DAILY   No facility-administered encounter medications on file as of 05/31/2021.    Allergies (verified) Patient has no known allergies.   History: Past Medical History:  Diagnosis Date   Allergy    Anxiety    Depression    Diabetes mellitus    ORAL MEDS-NO INSULIN   History of alcoholism ([place])    History of shingles    Hyperlipidemia    Hypertension    Ileus, postoperative ([place])    Post-op Chole Surgery   Past Surgical History:  Procedure Laterality Date   BONE EXOSTOSIS EXCISION  08/10/2011   Procedure: EXOSTOSIS EXCISION;  Surgeon: Marcheta Grammes, DPM;  Location: [facility];  Service: Orthopedics;  Laterality: Left;  Exostectomy of Calcaneous Left Foot   CARDIAC CATHETERIZATION  2002   CHOLECYSTECTOMY     COLON SURGERY     COLONOSCOPY  12/21/2015   Dr.Danis   ENDOVENOUS ABLATION SAPHENOUS VEIN W/ LASER Left 01/01/2019   endovenous laser ablation left greater saphenous vein by Ruta Hinds MD    OSTECTOMY Right 04/03/2018   Procedure: OSTECTOMY RIGHT CALCANEUS;  Surgeon: Caprice Beaver, DPM;  Location: [facility];  Service: Podiatry;  Laterality: Right;  RIGHT SHOULDER SURGERY FOR TEAR AND SPUR - YRS AGO     SYNOVECTOMY  08/10/2011   Procedure: SYNOVECTOMY;  Surgeon: Marcheta Grammes, DPM;  Location: [facility];  Service: Orthopedics;  Laterality: Left;  Synovectomy of Peroneal Tendon Left Foot   TENDON REPAIR  08/10/2011   Procedure: TENDON REPAIR;  Surgeon: Marcheta Grammes, DPM;  Location: [facility];  Service: Orthopedics;  Laterality: Left;  Repair of Peroneal Tendon Left Foot   TENDON REPAIR Right 04/03/2018   Procedure:  EXPLORATION OF PERONEAL TENDON WITH POSSIBLE REPAIR OF LONGITUDINAL TEAR OF THE PERONEAL TENDON RIGHT FOOT;  Surgeon: Caprice Beaver, DPM;  Location: [facility];  Service: Podiatry;  Laterality: Right;   TONSILLECTOMY     TOTAL HIP ARTHROPLASTY  03/29/2011   Procedure: TOTAL HIP ARTHROPLASTY;  Surgeon: Dione Plover Aluisio;  Location: [facility];  Service: Orthopedics;  Laterality: Right;   Family History  Problem Relation Age of Onset   Depression Mother    Cancer Mother        colon   Heart failure Mother    Colon cancer Mother 67   Coronary artery disease Father    Hypertension Father    Social History   Socioeconomic History   Marital status: Married    Spouse name: Not on file   Number of children: Not on file   Years of education: Not on file   Highest education level: Not on file  Occupational History   Not on file  Tobacco Use   Smoking status: Every Day    Packs/day: 0.00    Types: Cigars, Cigarettes   Smokeless tobacco: Never  Vaping Use   Vaping Use: Never used  Substance and Sexual Activity   Alcohol use: No    Comment: quit 12 yrs ago   Drug use: No   Sexual activity: Not on file  Other Topics Concern   Not on file  Social History Narrative   Married, lives with spouse.   Plans for home after surgery.   Living will, healthcare POA.   Social Determinants of Health   Financial Resource Strain: Low Risk    Difficulty of Paying Living Expenses: Not hard at all  Food Insecurity: No Food Insecurity   Worried About Charity fundraiser in the Last Year: Never true   [place] in the Last Year: Never true  Transportation Needs: No Transportation Needs   Lack of Transportation (Medical): No   Lack of Transportation (Non-Medical): No  Physical Activity: Insufficiently Active   Days of Exercise per Week: 2 days   Minutes of Exercise per Session: 60 min  Stress: No Stress Concern Present   Feeling of Stress : Not at all  Social Connections: Socially Integrated    Frequency of Communication with Friends and Family: More than three times a week   Frequency of Social Gatherings with Friends and Family: More than three times a week   Attends Religious Services: More than 4 times per year   Active Member of [employer] or Organizations: Yes   Attends Music therapist: More than 4 times per year   Marital Status: Married    Tobacco Counseling Ready to quit: No Counseling given: Yes   Clinical Intake: Nutrition Risk Assessment:  Has the patient had any N/V/D within the last 2 months?  No  Does the patient have any non-healing wounds?  No  Has the patient had any unintentional weight loss or weight gain?  No  Diabetes:  Is the patient diabetic?  Yes  If diabetic, was a CBG obtained today?  No  Did the patient bring in their glucometer from home?  No Audio Visit How often do you monitor your CBG's? Patient states PRN  Financial Strains and Diabetes Management:  Are you having any financial strains with the device, your supplies or your medication? No .  Does the patient want to be seen by Chronic Care Management for management of their diabetes?  No  Would the patient like to be referred to a Nutritionist or for Diabetic Management?  No Followed by PCP  Diabetic Exams:  Diabetic Eye Exam: Completed Yes.   Diabetic Foot Exam: Completed Yes. Followed by PCP  Diabetic? Yes  Interpreter Needed?: NoActivities of Daily Living In your present state of health, do you have any difficulty performing the following activities: 05/31/2021 03/22/2021  Hearing? N N  Vision? N N  Difficulty concentrating or making decisions? N N  Walking or climbing stairs? N N  Dressing or bathing? N N  Doing errands, shopping? N N  Preparing Food and eating ? N -  Using the Toilet? N -  In the past six months, have you accidently leaked urine? N -  Do you have problems with loss of bowel control? N -  Managing your Medications? N -  Managing your  Finances? N -  Housekeeping or managing your Housekeeping? N -  Some recent data might be hidden    Patient Care Team: [name], NP as PCP - General (Family Medicine) Ortho, [facility] (Orthopedic Surgery) Marlyce Huge, DDS as Consulting Physician (Dentistry) Viona Gilmore, [facility] as Pharmacist (Pharmacist)  Indicate any recent Medical Services you may have received from other than [facility] providers in the past year (date may be approximate).     Assessment:   This is a routine wellness examination for [place].  Virtual Visit via Telephone Note  I connected with  [name] on 05/31/21 at 11:15 AM EST by telephone and verified that I am speaking with the correct person using two identifiers.  Location: Patient: Home Provider: Office Persons participating in the virtual visit: patient/Nurse Health Advisor   I discussed the limitations, risks, security and privacy concerns of performing an evaluation and management service by telephone and the availability of in person appointments. The patient expressed understanding and agreed to proceed.  Interactive audio and video telecommunications were attempted between this nurse and patient, however failed, due to patient having technical difficulties OR patient did not have access to video capability.  We continued and completed visit with audio only.  Some vital signs may be absent or patient reported.   [name], LPN   Hearing/Vision screen Hearing Screening - Comments:: No difficulty hearing Vision Screening - Comments:: Wears reading glasses. Followed by Dr Katy Fitch  Dietary issues and exercise activities discussed: Current Exercise Habits: Home exercise routine, Type of exercise: walking, Time (Minutes): 60, Frequency (Times/Week): 2, Weekly Exercise (Minutes/Week): 120, Intensity: Moderate   Goals Addressed             This Visit's Progress    Increase physical activity       Start Silver Sneakers 3 days  weekly.       Depression Screen PHQ 2/9 Scores 05/31/2021 05/04/2020 03/17/2020 03/11/2019 11/15/2016 09/28/2015  PHQ - 2 Score 0 0 1 0 0 0  PHQ- 9 Score - 0 1 - - -    Fall Risk Fall Risk  05/31/2021  03/22/2021 05/04/2020 03/17/2020 03/11/2019  Falls in the past year? 0 0 0 0 0  Comment - - - - -  Number falls in past yr: 0 0 0 0 0  Injury with Fall? 0 0 0 0 0  Risk for fall due to : - No Fall Risks History of fall(s) - -  Follow up - - Falls evaluation completed;Falls prevention discussed - Falls evaluation completed    FALL RISK PREVENTION PERTAINING TO THE HOME:  Any stairs in or around the home? Yes  If so, are there any without handrails? No  Home free of loose throw rugs in walkways, pet beds, electrical cords, etc? Yes  Adequate lighting in your home to reduce risk of falls? Yes   ASSISTIVE DEVICES UTILIZED TO PREVENT FALLS:  Life alert? No  Use of a cane, walker or w/c? No  Grab bars in the bathroom? Yes  Shower chair or bench in shower? No  Elevated toilet seat or a handicapped toilet? Yes   TIMED UP AND GO:  Was the test performed? No . Audio Visit  Cognitive Function:   Immunizations Immunization History  Administered Date(s) Administered   Fluad Quad(high Dose 65+) 02/27/2019, 03/17/2020, 03/22/2021   Influenza Split 02/14/2011, 01/29/2012   Influenza Whole 05/22/2006   Influenza, High Dose Seasonal PF 05/30/2018   Influenza,inj,Quad PF,6+ Mos 02/24/2013, 02/12/2014   Moderna SARS-COV2 Booster Vaccination 01/18/2021   PFIZER(Purple Top)SARS-COV-2 Vaccination 07/06/2019, 07/29/2019   Pneumococcal Conjugate-13 02/12/2014   Pneumococcal Polysaccharide-23 05/22/2000, 05/03/2007, 10/24/2016   Td 05/22/1997   Tdap 01/29/2012    Covid-19 vaccine status: Completed vaccines  Qualifies for Shingles Vaccine? Yes   Zostavax completed No   Shingrix Completed?: No.    Education has been provided regarding the importance of this vaccine. Patient has been advised  to call insurance company to determine out of pocket expense if they have not yet received this vaccine. Advised may also receive vaccine at local pharmacy or Health Dept. Verbalized acceptance and understanding.  Screening Tests Health Maintenance  Topic Date Due   COVID-19 Vaccine (3 - Booster for Pfizer series) 06/16/2021 (Originally 03/15/2021)   Zoster Vaccines- Shingrix (1 of 2) 08/29/2021 (Originally 11/09/1999)   HEMOGLOBIN A1C  09/19/2021   OPHTHALMOLOGY EXAM  01/05/2022   TETANUS/TDAP  01/28/2022   FOOT EXAM  03/22/2022   COLONOSCOPY (Pts 45-72yrs Insurance coverage will need to be confirmed)  05/19/2026   Pneumonia Vaccine 68+ Years old  Completed   INFLUENZA VACCINE  Completed   Hepatitis C Screening  Completed   HPV VACCINES  Aged Out    Health Maintenance  There are no preventive care reminders to display for this patient.   Vision Screening: Recommended annual ophthalmology exams for early detection of glaucoma and other disorders of the eye. Is the patient up to date with their annual eye exam?  Yes  Who is the provider or what is the name of the office in which the patient attends annual eye exams? Followed by Dr Katy Fitch  Dental Screening: Recommended annual dental exams for proper oral hygiene  Community Resource Referral / Chronic Care Management:  CRR required this visit?  No   CCM required this visit?  No      Plan:     I have personally reviewed and noted the following in the patients chart:   Medical and social history Use of alcohol, tobacco or illicit drugs  Current medications and supplements including opioid prescriptions. Patient is not currently taking opioid prescriptions.  Functional ability and status Nutritional status Physical activity Advanced directives List of other physicians Hospitalizations, surgeries, and ER visits in previous 12 months Vitals Screenings to include cognitive, depression, and falls Referrals and  appointments  In addition, I have reviewed and discussed with patient certain preventive protocols, quality metrics, and best practice recommendations. A written personalized care plan for preventive services as well as general preventive health recommendations were provided to patient.     [name], LPN   08/28/8284

## 2021-06-15 ENCOUNTER — Telehealth: Payer: Self-pay | Admitting: Pharmacist

## 2021-06-15 NOTE — Progress Notes (Signed)
Chronic Care Management Pharmacy Assistant   Name: Jonathan Calhoun  MRN: [id number] DOB: 1950/07/11  Reason for Encounter: Medication Review / Medication Coordination   Conditions to be addressed/monitored: HTN  Recent office visits:  05/31/21 Jonathan Peaches, LPN - Patient presented for [facility] Annual Wellness Exam. No medication changes.  Recent consult visits:  05/19/21 Jonathan Stabler, MD Jonathan Calhoun) - Patient presented for Colonoscopy. No medication changes.  Hospital visits:  None in previous 6 months  Medications: Outpatient Encounter Medications as of 06/15/2021  Medication Sig   atorvastatin (LIPITOR) 20 MG tablet TAKE ONE TABLET BY MOUTH EVERY MORNING   blood glucose meter kit and supplies KIT Dispense based on patient and insurance preference. Use up to four times daily as directed. (FOR ICD-9 250.00, 250.01).   Blood Glucose Monitoring Suppl (ONETOUCH VERIO) w/Device KIT Use to test three times daily.   Cholecalciferol (VITAMIN D3) 50 MCG (2000 UT) TABS Take 2,000 Units by mouth daily.   Dulaglutide (TRULICITY) 0.53 ZJ/6.7HA SOPN INJECT 0.13m into THE SKIN ONCE A WEEK   glucose blood (ONETOUCH VERIO) test strip Use to test three times daily.   Lancets (ONETOUCH ULTRASOFT) lancets Use to test three times daily.   lisinopril (ZESTRIL) 5 MG tablet TAKE ONE TABLET BY MOUTH EVERY MORNING   metFORMIN (GLUCOPHAGE) 1000 MG tablet TAKE ONE TABLET BY MOUTH TWICE DAILY   No facility-administered encounter medications on file as of 06/15/2021.  Reviewed chart prior to disease state call. Spoke with patient regarding BP  Recent Office Vitals: BP Readings from Last 3 Encounters:  05/19/21 121/79  03/22/21 130/80  12/28/20 124/80   Pulse Readings from Last 3 Encounters:  05/19/21 61  03/22/21 69  12/28/20 80    Wt Readings from Last 3 Encounters:  05/31/21 224 lb (101.6 kg)  05/19/21 224 lb (101.6 kg)  05/05/21 224 lb (101.6 kg)     Kidney Function Lab Results   Component Value Date/Time   CREATININE 0.78 03/22/2021 10:00 AM   CREATININE 0.73 03/17/2020 08:17 AM   CREATININE 0.75 02/27/2019 07:43 AM   GFR 89.81 03/22/2021 10:00 AM   GFRNONAA 94 03/17/2020 08:17 AM   GFRAA 109 03/17/2020 08:17 AM    BMP Latest Ref Rng & Units 03/22/2021 03/17/2020 02/27/2019  Glucose 70 - 99 mg/dL 107(H) 127(H) 129(H)  BUN 6 - 23 mg/dL _0 Creatinine 0.40 - 1.50 mg/dL 0.78 0.73 0.75  BUN/Creat Ratio 6 - 22 (calc) - NOT APPLICABLE -  Sodium 1193- 145 mEq/L 138 140 138  Potassium 3.5 - 5.1 mEq/L 4.6 5.0 4.7  Chloride 96 - 112 mEq/L 102 105 103  CO2 19 - 32 mEq/L _1 Calcium 8.4 - 10.5 mg/dL 9.8 9.6 9.7    Current antihypertensive regimen:  Lisinopril 5 mg daily  How often are you checking your Blood Pressure? infrequently Current home BP readings: Patient reports he does not check his pressure much at home but had it done a few weeks ago during his colonoscopy. Per chart notes was 110/81 p 65. Patient denies any dizziness or lightheadedness. What recent interventions/DTPs have been made by any provider to improve Blood Pressure control since last CPP Visit: Patient reports no changes Any recent hospitalizations or ED visits since last visit with CPP? No  Adherence Review: Is the patient currently on ACE/ARB medication? Yes Does the patient have >5 day gap between last estimated fill dates? No   Reviewed chart for medication changes  ahead of medication coordination call.  No OVs, Consults, or hospital visits since last care coordination call/Pharmacist visit.   No medication changes indicated   BP Readings from Last 3 Encounters:  05/19/21 121/79  03/22/21 130/80  12/28/20 124/80    Lab Results  Component Value Date   HGBA1C 6.1 03/22/2021     Patient obtains medications through Adherence Packaging  30 Days   Last adherence delivery included:  Metformin 1000 mg: one tablet at breakfast and one at dinner Lisinopril 5 mg: one tablet  at breakfast Vitamin D3 2,000 unit at breakfast Atorvastatin 20 mg: one tablet at breakfast Trulicity 9.27 MG- Inject into skin weekly      Patient declined the following  due to on hand supply OneTouch Ultrasoft lancets: use to test three times a daily  OneTouch Verio test strips: use to test three times a daily     Patient is due for next adherence delivery on: 06/27/21. Called patient and reviewed medications and coordinated delivery. Confirmed Packaging for 30 DS  This delivery to include: Atorvastatin 20 mg: one tablet at breakfast Lisinopril 5 mg: one tablet at breakfast Metformin 1000 mg: one tablet at breakfast and one at dinner Vitamin D3 2,000 unit at breakfast Trulicity 6.39 MG- Inject into skin weekly      Patient declined the following  due to on hand supply OneTouch Ultrasoft lancets: use to test three times a daily  OneTouch Verio test strips: use to test three times a daily    Confirmed delivery date of 06/27/21, advised patient that pharmacy will contact them the morning of delivery.    Care Gaps: BP - 110/81 (05/19/21 Colonoscopy pt reported) AWV - 11/23 Zoster Vaccine - Overdue COVID Booster #3 Therapist, music) - Overdue Foot Exam - Overdue Colonoscopy - Overdue CCM -2/23 Lab Results  Component Value Date   HGBA1C 6.1 03/22/2021    Star Rating Drugs: Dulaglutide (Trulicity) 4.32 mg - Last filled 05/24/2021 28 DS at [facility] Metformin (Glucophage) 1000 mg - Last filled 05/20/2021 30 DS at [facility] Lisinopril (Zestril) 5 mg - Last filled 05/20/2021 30 DS at [facility] Atorvastatin (Lipitor) 20 mg - Last filled 05/20/2021 30 DS at [facility] Pharmacist Assistant [phone]

## 2021-06-22 ENCOUNTER — Telehealth: Payer: Self-pay | Admitting: Pharmacist

## 2021-06-22 NOTE — Chronic Care Management (AMB) (Signed)
Chronic Care Management Pharmacy Assistant   Name: Jonathan Calhoun  MRN: [id number] DOB: 1950-12-25  06/22/21 APPOINTMENT REMINDER   Called Patient No answer, left message of appointment on 06/24/21 at 8:30 via telephone visit with Jeni Salles, Pharm D.   Notified to have all medications, supplements, blood pressure and/or blood sugar logs available during appointment and to return call if need to reschedule.    Care Gaps: BP - 110/81 (05/19/21 Colonoscopy pt reported) AWV - 11/23 Zoster Vaccine - Overdue COVID Booster #3 Therapist, music) - Overdue Foot Exam - Overdue Colonoscopy - Overdue CCM -2/23 Lab Results  Component Value Date   HGBA1C 6.1 03/22/2021    Star Rating Drug: Dulaglutide (Trulicity) 7.86 mg - Last filled 05/24/2021 28 DS at [facility] Metformin (Glucophage) 1000 mg - Last filled 05/24/21 30 DS at [facility] Lisinopril (Zestril) 5 mg - Last filled 05/24/21 30 DS at [facility] Atorvastatin (Lipitor) 20 mg - Last filled 05/24/21 30 DS at [facility]  Any gaps in medications fill history?   None    Medications: Outpatient Encounter Medications as of 06/22/2021  Medication Sig   atorvastatin (LIPITOR) 20 MG tablet TAKE ONE TABLET BY MOUTH EVERY MORNING   blood glucose meter kit and supplies KIT Dispense based on patient and insurance preference. Use up to four times daily as directed. (FOR ICD-9 250.00, 250.01).   Blood Glucose Monitoring Suppl (ONETOUCH VERIO) w/Device KIT Use to test three times daily.   Cholecalciferol (VITAMIN D3) 50 MCG (2000 UT) TABS Take 2,000 Units by mouth daily.   Dulaglutide (TRULICITY) 7.54 GB/2.0FE SOPN INJECT 0.72m into THE SKIN ONCE A WEEK   glucose blood (ONETOUCH VERIO) test strip Use to test three times daily.   Lancets (ONETOUCH ULTRASOFT) lancets Use to test three times daily.   lisinopril (ZESTRIL) 5 MG tablet TAKE ONE TABLET BY MOUTH EVERY MORNING   metFORMIN (GLUCOPHAGE) 1000 MG tablet TAKE ONE TABLET BY MOUTH TWICE DAILY   No  facility-administered encounter medications on file as of 06/22/2021.       LWinonaClinical Pharmacist Assistant [phone]

## 2021-06-23 NOTE — Progress Notes (Signed)
Chronic Care Management Pharmacy Note  06/24/2021 Name:  Jonathan Calhoun MRN:  [id number] DOB:  26-Dec-1950  Summary: A1c at goal < 7% LDL not ideally at goal < 70 BP at goal < 130/80  Recommendations/Changes made from today's visit: -Recommended routine BP monitoring at home  Plan: DM assessment in 3 months   Subjective: Jonathan Calhoun is an 71 y.o. year old male who is a primary patient of [name], NP.  The CCM team was consulted for assistance with disease management and care coordination needs.    Engaged with patient by telephone for follow up visit in response to provider referral for pharmacy case management and/or care coordination services.   Consent to Services:  The patient was given information about Chronic Care Management services, agreed to services, and gave verbal consent prior to initiation of services.  Please see initial visit note for detailed documentation.   Patient Care Team: [name], NP as PCP - General (Family Medicine) Ortho, [place] (Orthopedic Surgery) Marlyce Huge, DDS as Consulting Physician (Dentistry) Viona Gilmore, [facility] as Pharmacist (Pharmacist)  Recent office visits: 05/31/21 [name], LPN - Patient presented for Medicare Annual Wellness Exam. No medication changes.  03/22/21 [name], NP - Patient presented for Routine medical examination and other concerns. Stopped Citalopram and prednisolone.   Recent consult visits: 05/19/21 [name], MD Gertie Fey) - Patient presented for Colonoscopy. No medication changes.  05/05/21 Levonne Spiller, RN - Patient presented for Colonoscopy prep. Prescribed Na Sulfate-K Sulfate-Mg Sulf.  Hospital visits: None in previous 6 months   Objective:  Lab Results  Component Value Date   CREATININE 0.78 03/22/2021   BUN 12 03/22/2021   GFR 89.81 03/22/2021   GFRNONAA 94 03/17/2020   GFRAA 109 03/17/2020   NA 138 03/22/2021   K 4.6 03/22/2021   CALCIUM 9.8  03/22/2021   CO2 28 03/22/2021   GLUCOSE 107 (H) 03/22/2021    Lab Results  Component Value Date/Time   HGBA1C 6.1 03/22/2021 10:00 AM   HGBA1C 5.9 (A) 12/28/2020 10:01 AM   HGBA1C 6.4 06/30/2020 09:21 AM   HGBA1C 6.3 (H) 03/17/2020 08:17 AM   HGBA1C 5.9 10/07/2019 07:11 AM   GFR 89.81 03/22/2021 10:00 AM   GFR 103.17 02/27/2019 07:43 AM   MICROALBUR 0.7 01/25/2016 08:57 AM   MICROALBUR 1.7 09/14/2015 08:42 AM    Last diabetic Eye exam:  Lab Results  Component Value Date/Time   HMDIABEYEEXA No Retinopathy 01/05/2021 12:00 AM    Last diabetic Foot exam: No results found for: HMDIABFOOTEX   Lab Results  Component Value Date   CHOL 134 03/22/2021   HDL 41.70 03/22/2021   LDLCALC 72 03/22/2021   TRIG 102.0 03/22/2021   CHOLHDL 3 03/22/2021    Hepatic Function Latest Ref Rng & Units 03/22/2021 03/17/2020 02/27/2019  Total Protein 6.0 - 8.3 g/dL 7.2 6.8 7.1  Albumin 3.5 - 5.2 g/dL 4.7 - 4.4  AST 0 - 37 U/L $Remo'23 17 19  'CpnDB$ ALT 0 - 53 U/L $Remo'22 19 20  'tbrua$ Alk Phosphatase 39 - 117 U/L 77 - 83  Total Bilirubin 0.2 - 1.2 mg/dL 1.1 0.6 1.1  Bilirubin, Direct 0.0 - 0.3 mg/dL - - -    Lab Results  Component Value Date/Time   TSH 2.07 03/22/2021 10:00 AM   TSH 1.64 03/17/2020 08:17 AM    CBC Latest Ref Rng & Units 03/22/2021 03/17/2020 02/27/2019  WBC 4.0 - 10.5 K/uL 5.9 8.2 9.9  Hemoglobin 13.0 - 17.0 g/dL 15.0 15.2 14.7  Hematocrit 39.0 - 52.0 % 44.1 44.8 43.7  Platelets 150.0 - 400.0 K/uL 202.0 222 214.0    Lab Results  Component Value Date/Time   VD25OH 39 03/17/2020 08:17 AM    Clinical ASCVD: No  The ASCVD Risk score (Arnett DK, et al., 2019) failed to calculate for the following reasons:   The systolic blood pressure is missing    Depression screen [facility] 2/9 05/31/2021 05/04/2020 03/17/2020  Decreased Interest 0 0 0  Down, Depressed, Hopeless 0 0 1  PHQ - 2 Score 0 0 1  Altered sleeping - 0 0  Tired, decreased energy - 0 0  Change in appetite - 0 0  Feeling bad or failure  about yourself  - 0 0  Trouble concentrating - 0 0  Moving slowly or fidgety/restless - 0 0  Suicidal thoughts - 0 0  PHQ-9 Score - 0 1  Difficult doing work/chores - Not difficult at all Not difficult at all      Social History   Tobacco Use  Smoking Status Every Day   Packs/day: 0.00   Types: Cigars, Cigarettes  Smokeless Tobacco Never   BP Readings from Last 3 Encounters:  05/19/21 121/79  03/22/21 130/80  12/28/20 124/80   Pulse Readings from Last 3 Encounters:  05/19/21 61  03/22/21 69  12/28/20 80   Wt Readings from Last 3 Encounters:  05/31/21 224 lb (101.6 kg)  05/19/21 224 lb (101.6 kg)  05/05/21 224 lb (101.6 kg)   BMI Readings from Last 3 Encounters:  05/31/21 28.76 kg/m  05/19/21 28.76 kg/m  05/05/21 28.76 kg/m    Assessment/Interventions: Review of patient past medical history, allergies, medications, health status, including review of consultants reports, laboratory and other test data, was performed as part of comprehensive evaluation and provision of chronic care management services.   SDOH:  (Social Determinants of Health) assessments and interventions performed: No  SDOH Screenings   Alcohol Screen: Not on file  Depression (PHQ2-9): Low Risk    PHQ-2 Score: 0  Financial Resource Strain: Low Risk    Difficulty of Paying Living Expenses: Not hard at all  Food Insecurity: No Food Insecurity   Worried About Charity fundraiser in the Last Year: Never true   Ran Out of Food in the Last Year: Never true  Housing: Low Risk    Last Housing Risk Score: 0  Physical Activity: Insufficiently Active   Days of Exercise per Week: 2 days   Minutes of Exercise per Session: 60 min  Social Connections: Engineer, building services of Communication with Friends and Family: More than three times a week   Frequency of Social Gatherings with Friends and Family: More than three times a week   Attends Religious Services: More than 4 times per year   Active  Member of [employer] or Organizations: Yes   Attends Music therapist: More than 4 times per year   Marital Status: Married  Stress: No Stress Concern Present   Feeling of Stress : Not at all  Tobacco Use: High Risk   Smoking Tobacco Use: Every Day   Smokeless Tobacco Use: Never   Passive Exposure: Not on file  Transportation Needs: No Transportation Needs   Lack of Transportation (Medical): No   Lack of Transportation (Non-Medical): No    CCM Care Plan  No Known Allergies  Medications Reviewed Today     Reviewed by [name], LPN (  Licensed Chiropractor) on 05/31/21 at [place] List Status: <None>   Medication Order Taking? Sig Documenting Provider Last Dose Status Informant  atorvastatin (LIPITOR) 20 MG tablet [id number] No TAKE ONE TABLET BY MOUTH EVERY MORNING Nafziger, Cory, NP 05/18/2021 Active   blood glucose meter kit and supplies KIT [id number] No Dispense based on patient and insurance preference. Use up to four times daily as directed. (FOR ICD-9 250.00, 250.01). [name], NP 05/19/2021 Active   Blood Glucose Monitoring Suppl (ONETOUCH VERIO) w/Device KIT [id number] No Use to test three times daily. [name], NP 05/19/2021 Active   Cholecalciferol (VITAMIN D3) 50 MCG (2000 UT) TABS [id number] No Take 2,000 Units by mouth daily. [provider] 05/18/2021 Active   Dulaglutide (TRULICITY) 4.48 JE/5.6DJ SOPN [id number] No INJECT 0.$RemoveBef'75mg'ZnvwMGtoot$  into THE SKIN ONCE A WEEK Nafziger, Cory, NP 05/18/2021 Active   glucose blood (ONETOUCH VERIO) test strip [id number] No Use to test three times daily. [name], NP 05/19/2021 Active   Lancets [facility] ULTRASOFT) lancets [id number] No Use to test three times daily. [name], NP 05/19/2021 Active   lisinopril (ZESTRIL) 5 MG tablet [id number] No TAKE ONE TABLET BY MOUTH EVERY MORNING Nafziger, Tommi Rumps, NP 05/18/2021 Active   metFORMIN (GLUCOPHAGE) 1000 MG tablet [id number] No TAKE ONE TABLET BY MOUTH TWICE  DAILY [name], NP 05/18/2021 Active             Patient Active Problem List   Diagnosis Date Noted   Diabetes ([place]) 09/28/2015   Other cyst of bone, right lower leg 02/24/2013   Osteoarthritis of right hip 03/30/2011   AUTONOMIC NEUROPATHY, DIABETIC 04/29/2010   DISC DISEASE, LUMBAR 05/04/2007   Hyperlipidemia 02/13/2007   ERECTILE DYSFUNCTION 02/13/2007   Essential hypertension 02/13/2007   ALLERGIC RHINITIS 02/13/2007    Immunization History  Administered Date(s) Administered   Fluad Quad(high Dose 65+) 02/27/2019, 03/17/2020, 03/22/2021   Influenza Split 02/14/2011, 01/29/2012   Influenza Whole 05/22/2006   Influenza, High Dose Seasonal PF 05/30/2018   Influenza,inj,Quad PF,6+ Mos 02/24/2013, 02/12/2014   Moderna SARS-COV2 Booster Vaccination 01/18/2021   PFIZER(Purple Top)SARS-COV-2 Vaccination 07/06/2019, 07/29/2019   Pneumococcal Conjugate-13 02/12/2014   Pneumococcal Polysaccharide-23 05/22/2000, 05/03/2007, 10/24/2016   Td 05/22/1997   Tdap 01/29/2012   Patient reports he is doing a really good job of taking care of himself. He joined the [employer] and has been getting a few good workouts a week and doing work outside as well. He gets up around 6am and is getting good sleep and sleeping well. He also has been losing some weight about 1-2 lbs per month.  Conditions to be addressed/monitored:  Hypertension, Hyperlipidemia, Diabetes, and Depression  Conditions addressed this visit: Hypertension, diabetes, depression  Care Plan : CCM Pharmacy Care Plan  Updates made by Viona Gilmore, [place] since 06/24/2021 12:00 AM     Problem: Problem: Hypertension, Hyperlipidemia, Diabetes, and Depression      Long-Range Goal: Patient-Specific Goal   Start Date: 06/24/2021  Expected End Date: 06/24/2022  This Visit's Progress: On track  Priority: High  Note:   Current Barriers:  Unable to independently monitor therapeutic efficacy  Pharmacist Clinical Goal(s):   Patient will achieve adherence to monitoring guidelines and medication adherence to achieve therapeutic efficacy through collaboration with PharmD and provider.   Interventions: 1:1 collaboration with [name], NP regarding development and update of comprehensive plan of care as evidenced by provider attestation and co-signature Inter-disciplinary care team collaboration (see longitudinal plan of care) Comprehensive medication review performed; medication  list updated in electronic medical record  Hypertension (BP goal <130/80) -Controlled -Current treatment: Lisinopril 5 mg 1 tablet daily - Appropriate, Effective, Safe, Accessible -Medications previously tried: none  -Current home readings: 121/78 (pre-medication) -Current dietary habits: does limit salt intake -Current exercise habits: joined a local gym and working out a few times a week; active outside the other days -Denies hypotensive/hypertensive symptoms -Educated on BP goals and benefits of medications for prevention of heart attack, stroke and kidney damage; Importance of home blood pressure monitoring; Proper BP monitoring technique; -Counseled to monitor BP at home weekly, document, and provide log at future appointments -Counseled on diet and exercise extensively Recommended to continue current medication  Hyperlipidemia: (LDL goal < 70) -Not ideally controlled -Current treatment: Atorvastatin 20 mg 1 tablet daily - Appropriate, Query effective, Safe, Accessible -Medications previously tried: none  -Current dietary patterns: working on losing weight -Current exercise habits: working out a few times a week at [employer] -Educated on Cholesterol goals;  Benefits of statin for ASCVD risk reduction; Importance of limiting foods high in cholesterol; -Counseled on diet and exercise extensively Recommended to continue current medication  Diabetes (A1c goal <7%) -Controlled -Current medications: Trulicity 0.94 mg  inject once weekly - Appropriate, Effective, Safe, Accessible Metformin 1000 mg 1 tablet twice daily - Appropriate, Effective, Safe, Accessible -Medications previously tried: none  -Current home glucose readings fasting glucose: 100-115 (checking a few times a week) post prandial glucose: not checking -Denies hypoglycemic/hyperglycemic symptoms -Current meal patterns:  breakfast: did not discuss  lunch: did not discuss  dinner: did not discuss snacks: did not discuss drinks: did not discuss -Current exercise: working out a few times at the gym per week -Educated on A1c and blood sugar goals; Benefits of routine self-monitoring of blood sugar; -Counseled to check feet daily and get yearly eye exams -Counseled on diet and exercise extensively Recommended to continue current medication  Depression (Goal: minimize symptoms) -Controlled -Current treatment: No medications -Medications previously tried/failed: citalopram (no longer needed) -PHQ9: 0 -GAD7: n/a -Educated on Benefits of medication for symptom control -Recommended to continue without medication.  Health Maintenance -Vaccine gaps: COVID booster, shingrix (declines) -Current therapy:  Vitamin D 2000 units daily -Educated on Cost vs benefit of each product must be carefully weighed by individual consumer -Patient is satisfied with current therapy and denies issues -Recommended to continue current medication  Patient Goals/Self-Care Activities Patient will:  - check glucose a few times a week, document, and provide at future appointments check blood pressure weekly, document, and provide at future appointments target a minimum of 150 minutes of moderate intensity exercise weekly  Follow Up Plan: Telephone follow up appointment with care management team member scheduled for: 1 year          Medication Assistance: None required.  Patient affirms current coverage meets needs.  Compliance/Adherence/Medication fill  history: Care Gaps: Shingrix, COVID booster, foot exam, colonoscopy BP - 110/81 (05/19/21 Colonoscopy pt reported) A1c - 6.1% (03/22/21)  Star-Rating Drugs: Dulaglutide (Trulicity) 7.09 mg - Last filled 05/24/2021 28 DS at [facility] Metformin (Glucophage) 1000 mg - Last filled 05/20/2021 30 DS at [facility] Lisinopril (Zestril) 5 mg - Last filled 05/20/2021 30 DS at [facility] Atorvastatin (Lipitor) 20 mg - Last filled 05/20/2021 30 DS at [facility]  Patient's preferred pharmacy is:  [facility] - [place], [place] - [address] [place] [address] Phone: [phone] Fax: [phone]  Uses pill box? No - adherence packaging Pt  endorses 99% compliance  We discussed: Benefits of medication synchronization, packaging and delivery as well as enhanced pharmacist oversight with [facility]. Patient decided to: Utilize [facility] pharmacy for medication synchronization, packaging and delivery  Care Plan and Follow Up Patient Decision:  Patient agrees to Care Plan and Follow-up.  Plan: Telephone follow up appointment with care management team member scheduled for:  1 year  Jeni Salles, PharmD, [place] Pharmacist [place] at [place] [phone]

## 2021-06-24 ENCOUNTER — Ambulatory Visit (INDEPENDENT_AMBULATORY_CARE_PROVIDER_SITE_OTHER): Payer: PPO | Admitting: Pharmacist

## 2021-06-24 DIAGNOSIS — E1169 Type 2 diabetes mellitus with other specified complication: Secondary | ICD-10-CM

## 2021-06-24 DIAGNOSIS — I10 Essential (primary) hypertension: Secondary | ICD-10-CM

## 2021-06-24 NOTE — Patient Instructions (Signed)
Hi Conley,  It was great to catch up with you again! I am so glad to hear everything is going well right now, keep up the excellent work of taking care of yourself.  Please reach out to me if you have any questions or need anything before our follow up!  Best, Maddie  Jeni Salles, PharmD, [place] [facility] [place]   Visit Information   Goals Addressed   None    Patient Care Plan: CCM Pharmacy Care Plan     Problem Identified: Problem: Hypertension, Hyperlipidemia, Diabetes, and Depression      Long-Range Goal: Patient-Specific Goal   Start Date: 06/24/2021  Expected End Date: 06/24/2022  This Visit's Progress: On track  Priority: High  Note:   Current Barriers:  Unable to independently monitor therapeutic efficacy  Pharmacist Clinical Goal(s):  Patient will achieve adherence to monitoring guidelines and medication adherence to achieve therapeutic efficacy through collaboration with PharmD and provider.   Interventions: 1:1 collaboration with [name], NP regarding development and update of comprehensive plan of care as evidenced by provider attestation and co-signature Inter-disciplinary care team collaboration (see longitudinal plan of care) Comprehensive medication review performed; medication list updated in electronic medical record  Hypertension (BP goal <130/80) -Controlled -Current treatment: Lisinopril 5 mg 1 tablet daily - Appropriate, Effective, Safe, Accessible -Medications previously tried: none  -Current home readings: 121/78 (pre-medication) -Current dietary habits: does limit salt intake -Current exercise habits: joined a local gym and working out a few times a week; active outside the other days -Denies hypotensive/hypertensive symptoms -Educated on BP goals and benefits of medications for prevention of heart attack, stroke and kidney damage; Importance of home blood pressure monitoring; Proper  BP monitoring technique; -Counseled to monitor BP at home weekly, document, and provide log at future appointments -Counseled on diet and exercise extensively Recommended to continue current medication  Hyperlipidemia: (LDL goal < 70) -Not ideally controlled -Current treatment: Atorvastatin 20 mg 1 tablet daily - Appropriate, Query effective, Safe, Accessible -Medications previously tried: none  -Current dietary patterns: working on losing weight -Current exercise habits: working out a few times a week at [employer] -Educated on Cholesterol goals;  Benefits of statin for ASCVD risk reduction; Importance of limiting foods high in cholesterol; -Counseled on diet and exercise extensively Recommended to continue current medication  Diabetes (A1c goal <7%) -Controlled -Current medications: Trulicity 2.45 mg inject once weekly - Appropriate, Effective, Safe, Accessible Metformin 1000 mg 1 tablet twice daily - Appropriate, Effective, Safe, Accessible -Medications previously tried: none  -Current home glucose readings fasting glucose: 100-115 (checking a few times a week) post prandial glucose: not checking -Denies hypoglycemic/hyperglycemic symptoms -Current meal patterns:  breakfast: did not discuss  lunch: did not discuss  dinner: did not discuss snacks: did not discuss drinks: did not discuss -Current exercise: working out a few times at the gym per week -Educated on A1c and blood sugar goals; Benefits of routine self-monitoring of blood sugar; -Counseled to check feet daily and get yearly eye exams -Counseled on diet and exercise extensively Recommended to continue current medication  Depression (Goal: minimize symptoms) -Controlled -Current treatment: No medications -Medications previously tried/failed: citalopram (no longer needed) -PHQ9: 0 -GAD7: n/a -Educated on Benefits of medication for symptom control -Recommended to continue without medication.  Health  Maintenance -Vaccine gaps: COVID booster, shingrix (declines) -Current therapy:  Vitamin D 2000 units daily -Educated on Cost vs benefit of each product must be carefully weighed by individual consumer -Patient is  satisfied with current therapy and denies issues -Recommended to continue current medication  Patient Goals/Self-Care Activities Patient will:  - check glucose a few times a week, document, and provide at future appointments check blood pressure weekly, document, and provide at future appointments target a minimum of 150 minutes of moderate intensity exercise weekly  Follow Up Plan: Telephone follow up appointment with care management team member scheduled for: 1 year       Patient verbalizes understanding of instructions and care plan provided today and agrees to view in [place]. Active MyChart status confirmed with patient.   Telephone follow up appointment with pharmacy team member scheduled for: 1 year  Viona Gilmore, [facility]

## 2021-07-14 ENCOUNTER — Telehealth: Payer: Self-pay | Admitting: Pharmacist

## 2021-07-14 NOTE — Chronic Care Management (AMB) (Signed)
° ° °  Chronic Care Management Pharmacy Assistant   Name: Jonathan Calhoun  MRN: [id number] DOB: [birth year]  Reason for Encounter: Medication Review Medication Coordination  Recent office visits:  None  Recent consult visits:  None  Hospital visits:  None in previous 6 months  Medications: Outpatient Encounter Medications as of 07/14/2021  Medication Sig   atorvastatin (LIPITOR) 20 MG tablet TAKE ONE TABLET BY MOUTH EVERY MORNING   blood glucose meter kit and supplies KIT Dispense based on patient and insurance preference. Use up to four times daily as directed. (FOR ICD-9 250.00, 250.01).   Blood Glucose Monitoring Suppl (ONETOUCH VERIO) w/Device KIT Use to test three times daily.   Cholecalciferol (VITAMIN D3) 50 MCG (2000 UT) TABS Take 2,000 Units by mouth daily.   Dulaglutide (TRULICITY) 4.81 YH/9.0BP SOPN INJECT 0.82m into THE SKIN ONCE A WEEK   glucose blood (ONETOUCH VERIO) test strip Use to test three times daily.   Lancets (ONETOUCH ULTRASOFT) lancets Use to test three times daily.   lisinopril (ZESTRIL) 5 MG tablet TAKE ONE TABLET BY MOUTH EVERY MORNING   metFORMIN (GLUCOPHAGE) 1000 MG tablet TAKE ONE TABLET BY MOUTH TWICE DAILY   No facility-administered encounter medications on file as of 07/14/2021.  Reviewed chart for medication changes ahead of medication coordination call.  No OVs, Consults, or hospital visits since last care coordination call/Pharmacist visit.  No medication changes indicated  BP Readings from Last 3 Encounters:  05/19/21 121/79  03/22/21 130/80  12/28/20 124/80    Lab Results  Component Value Date   HGBA1C 6.1 03/22/2021     Patient obtains medications through Adherence Packaging  30 Days   Last adherence delivery included:   Atorvastatin 20 mg: one tablet at breakfast Lisinopril 5 mg: one tablet at breakfast Metformin 1000 mg: one tablet at breakfast and one at dinner Vitamin D3 2,000 unit at breakfast Trulicity 01.12MG- Inject into  skin weekly    Patient declined the following  due to on hand supply OneTouch Ultrasoft lancets: use to test three times a daily  OneTouch Verio test strips: use to test three times a daily    Patient is due for next adherence delivery on: 07/26/21. Called patient and reviewed medications and coordinated delivery. Confirmed Packaging for 30 DS This delivery to include: Atorvastatin 20 mg: one tablet at breakfast Lisinopril 5 mg: one tablet at breakfast Metformin 1000 mg: one tablet at breakfast and one at dinner Vitamin D3 2,000 unit at breakfast Trulicity 01.62MG- Inject into skin weekly     Confirmed delivery date of 07/26/21, advised patient that pharmacy will contact them the morning of delivery.   Care Gaps: BP- 130/80 (03/22/21) AWV- 1/23 COVID Booster - Overdue Lab Results  Component Value Date   HGBA1C 6.1 03/22/2021    Star Rating Drugs: Dulaglutide (Trulicity) 04.46mg - Last filled 06/20/2021 28 DS at [facility] Metformin (Glucophage) 1000 mg - Last filled 06/20/2021 30 DS at [facility] Lisinopril (Zestril) 5 mg - Last filled 06/20/2021 30 DS at [facility] Atorvastatin (Lipitor) 20 mg - [facility] [facility] [place] [facility] [phone]

## 2021-07-19 DIAGNOSIS — I10 Essential (primary) hypertension: Secondary | ICD-10-CM | POA: Diagnosis not present

## 2021-07-19 DIAGNOSIS — E1169 Type 2 diabetes mellitus with other specified complication: Secondary | ICD-10-CM | POA: Diagnosis not present

## 2021-08-16 ENCOUNTER — Telehealth: Payer: Self-pay | Admitting: Pharmacist

## 2021-08-16 NOTE — Chronic Care Management (AMB) (Signed)
? ? ?Chronic Care Management ?Pharmacy Assistant  ? ?Name: Jonathan Calhoun  MRN: [id number] DOB: 01/27/1951 ? ?Reason for Encounter: Disease State and Medication Review ?  ?Conditions to be addressed/monitored: ?DMII ? ?Recent office visits:  ?None ? ?Recent consult visits:  ?None ? ?Hospital visits:  ?None in previous 6 months ? ?Medications: ?Outpatient Encounter Medications as of 08/16/2021  ?Medication Sig  ? atorvastatin (LIPITOR) 20 MG tablet TAKE ONE TABLET BY MOUTH EVERY MORNING  ? blood glucose meter kit and supplies KIT Dispense based on patient and insurance preference. Use up to four times daily as directed. (FOR ICD-9 250.00, 250.01).  ? Blood Glucose Monitoring Suppl (ONETOUCH VERIO) w/Device KIT Use to test three times daily.  ? Cholecalciferol (VITAMIN D3) 50 MCG (2000 UT) TABS Take 2,000 Units by mouth daily.  ? Dulaglutide (TRULICITY) 1.94 RD/4.0CX SOPN INJECT 0.41m into THE SKIN ONCE A WEEK  ? glucose blood (ONETOUCH VERIO) test strip Use to test three times daily.  ? Lancets (ONETOUCH ULTRASOFT) lancets Use to test three times daily.  ? lisinopril (ZESTRIL) 5 MG tablet TAKE ONE TABLET BY MOUTH EVERY MORNING  ? metFORMIN (GLUCOPHAGE) 1000 MG tablet TAKE ONE TABLET BY MOUTH TWICE DAILY  ? ?No facility-administered encounter medications on file as of 08/16/2021.  ?Recent Relevant Labs: ?Lab Results  ?Component Value Date/Time  ? HGBA1C 6.1 03/22/2021 10:00 AM  ? HGBA1C 5.9 (A) 12/28/2020 10:01 AM  ? HGBA1C 6.4 06/30/2020 09:21 AM  ? HGBA1C 6.3 (H) 03/17/2020 08:17 AM  ? HGBA1C 5.9 10/07/2019 07:11 AM  ? MICROALBUR 0.7 01/25/2016 08:57 AM  ? MICROALBUR 1.7 09/14/2015 08:42 AM  ?  ?Kidney Function ?Lab Results  ?Component Value Date/Time  ? CREATININE 0.78 03/22/2021 10:00 AM  ? CREATININE 0.73 03/17/2020 08:17 AM  ? CREATININE 0.75 02/27/2019 07:43 AM  ? GFR 89.81 03/22/2021 10:00 AM  ? GFRNONAA 94 03/17/2020 08:17 AM  ? GFRAA 109 03/17/2020 08:17 AM  ? ? ?Current antihyperglycemic regimen:  ?Trulicity  04.48mg inject once weekly - Appropriate, Effective, Safe, Accessible ?Metformin 1000 mg 1 tablet twice daily - Appropriate, Effective, Safe, Accessible ?What recent interventions/DTPs have been made to improve glycemic control:  ?Patient reports no changes ?Have there been any recent hospitalizations or ED visits since last visit with CPP? No ?Patient denies hypoglycemic symptoms, including None ?Patient denies hyperglycemic symptoms, including none ?How often are you checking your blood sugar? Patient reports weekly ?What are your blood sugars ranging?  ?Patient reports they remain in range. ?During the week, how often does your blood glucose drop below 70? Never ? ?Adherence Review: ?Is the patient currently on a STATIN medication? Yes ?Is the patient currently on ACE/ARB medication? No ?Does the patient have >5 day gap between last estimated fill dates? No ? ?Reviewed chart for medication changes ahead of medication coordination call. ? ?No OVs, Consults, or hospital visits since last care coordination call/Pharmacist visit.  ? ?No medication changes indicated. ? ?BP Readings from Last 3 Encounters:  ?05/19/21 121/79  ?03/22/21 130/80  ?12/28/20 124/80  ?  ?Lab Results  ?Component Value Date  ? HGBA1C 6.1 03/22/2021  ?  ? ?Patient obtains medications through Adherence Packaging  30 Days  ? ?Last adherence delivery included:  ?Atorvastatin 20 mg: one tablet at breakfast ?Lisinopril 5 mg: one tablet at breakfast ?Metformin 1000 mg: one tablet at breakfast and one at dinner ?Vitamin D3 2,000 unit at breakfast ?Trulicity 01.85MG- Inject into skin weekly  ?  ?Patient declined  the following  due to on hand supply ?OneTouch Ultrasoft lancets: use to test three times a daily  ?OneTouch Verio test strips: use to test three times a daily ? ?Patient is due for next adherence delivery on: 08/25/21. ?Called patient and reviewed medications and coordinated delivery. ?Packs for 30 DS ? ?This delivery to include: ?Vitamin D3  2,000 unit at breakfast ?Atorvastatin 20 mg: one tablet at breakfast ?Metformin 1000 mg: one tablet at breakfast  ?Lisinopril 5 mg: one tablet at breakfast ?Trulicity 5.95 MG- Inject into skin weekly  ? ? ?Patient declined needing any: ?OneTouch Ultrasoft lancets: use to test three times a daily  ?OneTouch Verio test strips: use to test three times a daily ? ? ? ?Confirmed delivery date of 08/25/21, advised patient that pharmacy will contact them the morning of delivery.  ? ? ?Care Gaps: ?COVID Booster - Overdue ?BP- 130/80 (03/22/21) ?AWV- 1/23 ?CCM - 2/24 ?Lab Results  ?Component Value Date  ? HGBA1C 6.1 03/22/2021  ? ? ?Star Rating Drugs: ?Dulaglutide (Trulicity) 3.96 mg - Last filled 07/19/2021 28 DS at [facility] ?Metformin (Glucophage) 1000 mg - Last filled 07/19/2021 30 DS [facility] ?Lisinopril (Zestril) 5 mg - Last filled 07/19/2021 30 DS [facility] ?Atorvastatin (Lipitor) 20 mg - Last filled 07/19/2021 30 DS at [facility] ?  ? ?Ned Clines CMA ?Clinical Pharmacist Assistant ?[phone] ? ?

## 2021-09-13 ENCOUNTER — Telehealth: Payer: Self-pay | Admitting: Pharmacist

## 2021-09-13 NOTE — Chronic Care Management (AMB) (Addendum)
? ? ?  Chronic Care Management ?Pharmacy Assistant  ? ?Name: Jonathan Calhoun  MRN: [id number] DOB: 1951/05/05 ? ?Reason for Encounter: Medication Review Medication Coordination  ?  ?Recent office visits:  ?None ? ?Recent consult visits:  ?None ? ?Hospital visits:  ?None in previous 6 months ? ?Medications: ?Outpatient Encounter Medications as of 09/13/2021  ?Medication Sig  ? atorvastatin (LIPITOR) 20 MG tablet TAKE ONE TABLET BY MOUTH EVERY MORNING  ? blood glucose meter kit and supplies KIT Dispense based on patient and insurance preference. Use up to four times daily as directed. (FOR ICD-9 250.00, 250.01).  ? Blood Glucose Monitoring Suppl (ONETOUCH VERIO) w/Device KIT Use to test three times daily.  ? Cholecalciferol (VITAMIN D3) 50 MCG (2000 UT) TABS Take 2,000 Units by mouth daily.  ? Dulaglutide (TRULICITY) 8.81 JS/3.1RX SOPN INJECT 0.$RemoveBefor'75mg'FxUcEMLEaosM$  into THE SKIN ONCE A WEEK  ? glucose blood (ONETOUCH VERIO) test strip Use to test three times daily.  ? Lancets (ONETOUCH ULTRASOFT) lancets Use to test three times daily.  ? lisinopril (ZESTRIL) 5 MG tablet TAKE ONE TABLET BY MOUTH EVERY MORNING  ? metFORMIN (GLUCOPHAGE) 1000 MG tablet TAKE ONE TABLET BY MOUTH TWICE DAILY  ? ?No facility-administered encounter medications on file as of 09/13/2021.  ?Reviewed chart for medication changes ahead of medication coordination call. ? ?No OVs, Consults, or hospital visits since last care coordination call/Pharmacist visit.  ? ?No medication changes indicated. ? ?BP Readings from Last 3 Encounters:  ?05/19/21 121/79  ?03/22/21 130/80  ?12/28/20 124/80  ?  ?Lab Results  ?Component Value Date  ? HGBA1C 6.1 03/22/2021  ?  ? ?Patient obtains medications through Adherence Packaging  30 Days  ? ?Last adherence delivery included:  ?Vitamin D3 2,000 unit at breakfast ?Atorvastatin 20 mg: one tablet at breakfast ?Metformin 1000 mg: one tablet at breakfast and at dinner ?Lisinopril 5 mg: one tablet at breakfast ?Trulicity 4.58 MG- Inject into  skin weekly  ?  ?  ?Patient declined needing any: ?OneTouch Ultrasoft lancets: use to test three times a daily  ?OneTouch Verio test strips: use to test three times a daily ?  ?Patient is due for next adherence delivery on: 09/23/21. ?Called patient and reviewed medications and coordinated delivery. ?Confirmed Packs 30 DS ? ?This delivery to include: ?Vitamin D3 2,000 unit at breakfast ?Atorvastatin 20 mg: one tablet at breakfast ?Metformin 1000 mg: one tablet at breakfast and at dinner ?Lisinopril 5 mg: one tablet at breakfast ?Trulicity 5.92 MG- Inject into skin weekly  ? ? ? ?Unable to reach to confirm delivery date of 09/23/21, advised patient that pharmacy will contact him the morning of delivery.  ? ?Care Gaps: ?Zoster Vaccine - Overdue ?COVID Booster - Overdue ?BP- 130/80 (03/22/21) ?CCM- [facility] Pt unable to reach to confirm ?AWV- 1/23 ?Lab Results  ?Component Value Date  ? HGBA1C 6.1 03/22/2021  ? ? ?Star Rating Drugs: ?Dulaglutide (Trulicity) 9.24 mg - Last filled 08/19/2021 28 DS at [facility] ?Metformin (Glucophage) 1000 mg - Last filled 08/19/2021 30 DS [facility] ?Lisinopril (Zestril) 5 mg - Last filled 08/19/2021 30 DS [facility] ?Atorvastatin (Lipitor) 20 mg - Last filled 08/19/2021 30 DS [facility] ? ? ? ?Ned Clines CMA ?Clinical Pharmacist Assistant ?[phone] ? ?

## 2021-10-13 ENCOUNTER — Telehealth: Payer: Self-pay | Admitting: Pharmacist

## 2021-10-13 NOTE — Chronic Care Management (AMB) (Signed)
Chronic Care Management Pharmacy Assistant   Name: Quantrell L Burrows  MRN: [id number] DOB: 03/22/1951  Reason for Encounter: Medication Review/ Medication Coordination   Conditions to be addressed/monitored: HTN and DMII  Recent office visits:  None  Recent consult visits:  None  Hospital visits:  None in previous 6 months  Medications: Outpatient Encounter Medications as of 10/13/2021  Medication Sig   atorvastatin (LIPITOR) 20 MG tablet TAKE ONE TABLET BY MOUTH EVERY MORNING   blood glucose meter kit and supplies KIT Dispense based on patient and insurance preference. Use up to four times daily as directed. (FOR ICD-9 250.00, 250.01).   Blood Glucose Monitoring Suppl (ONETOUCH VERIO) w/Device KIT Use to test three times daily.   Cholecalciferol (VITAMIN D3) 50 MCG (2000 UT) TABS Take 2,000 Units by mouth daily.   Dulaglutide (TRULICITY) 0.75 MG/0.5ML SOPN INJECT 0.75mg into THE SKIN ONCE A WEEK   glucose blood (ONETOUCH VERIO) test strip Use to test three times daily.   Lancets (ONETOUCH ULTRASOFT) lancets Use to test three times daily.   lisinopril (ZESTRIL) 5 MG tablet TAKE ONE TABLET BY MOUTH EVERY MORNING   metFORMIN (GLUCOPHAGE) 1000 MG tablet TAKE ONE TABLET BY MOUTH TWICE DAILY   No facility-administered encounter medications on file as of 10/13/2021.  Reviewed chart for medication changes ahead of medication coordination call.  No OVs, Consults, or hospital visits since last care coordination call/Pharmacist visit.  No medication changes indicated.  BP Readings from Last 3 Encounters:  05/19/21 121/79  03/22/21 130/80  12/28/20 124/80    Lab Results  Component Value Date   HGBA1C 6.1 03/22/2021     Patient obtains medications through Adherence Packaging  30 Days   Last adherence delivery included: Vitamin D3 2,000 unit at breakfast Atorvastatin 20 mg: one tablet at breakfast Metformin 1000 mg: one tablet at breakfast and at dinner Lisinopril 5 mg: one  tablet at breakfast Trulicity 0.75 MG- Inject into skin weekly        Unable to reach to confirm delivery date of 09/23/21, left msg for patient that pharmacy will contact him the morning of delivery.    Patient is due for next adherence delivery on: 10/25/21. Called patient and reviewed medications and coordinated delivery. Packs for 30 DS  This delivery to include: Vitamin D3 2,000 unit at breakfast Atorvastatin 20 mg: one tablet at breakfast Metformin 1000 mg: one tablet at breakfast and at dinner Lisinopril 5 mg: one tablet at breakfast Trulicity 0.75 MG- Inject into skin weekly     Unable to reach to confirm delivery date of 10/25/21, left message for patient that pharmacy will contact them the morning of delivery.   Care Gaps: Zoster Vaccine - Overdue COVID Booster - Overdue HGB A1C - Overdue CCM - [facility]  Star Rating Drugs: Dulaglutide (Trulicity) 0.75 mg - Last filled 09/16/2021 28 DS [facility] Metformin (Glucophage) 1000 mg - Last filled 09/16/2021 30 DS [facility] Lisinopril (Zestril) 5 mg - Last filled 09/16/2021 30 [facility] Atorvastatin (Lipitor) 20 mg - Last filled 09/16/2021 30 DS [facility]    Laresia Green CMA Clinical Pharmacist Assistant [phone]  

## 2021-11-10 ENCOUNTER — Telehealth: Payer: Self-pay | Admitting: Pharmacist

## 2021-11-10 NOTE — Chronic Care Management (AMB) (Unsigned)
    Chronic Care Management Pharmacy Assistant   Name: Jonathan Calhoun  MRN: [id number] DOB: 11/16/1950  Reason for Encounter: Medication Review Medication Coordination   Recent office visits:  None  Recent consult visits:  None  Hospital visits:  None in previous 6 months  Medications: Outpatient Encounter Medications as of 11/10/2021  Medication Sig   atorvastatin (LIPITOR) 20 MG tablet TAKE ONE TABLET BY MOUTH EVERY MORNING   blood glucose meter kit and supplies KIT Dispense based on patient and insurance preference. Use up to four times daily as directed. (FOR ICD-9 250.00, 250.01).   Blood Glucose Monitoring Suppl (ONETOUCH VERIO) w/Device KIT Use to test three times daily.   Cholecalciferol (VITAMIN D3) 50 MCG (2000 UT) TABS Take 2,000 Units by mouth daily.   Dulaglutide (TRULICITY) 0.09 FG/1.8EX SOPN INJECT 0.6m into THE SKIN ONCE A WEEK   glucose blood (ONETOUCH VERIO) test strip Use to test three times daily.   Lancets (ONETOUCH ULTRASOFT) lancets Use to test three times daily.   lisinopril (ZESTRIL) 5 MG tablet TAKE ONE TABLET BY MOUTH EVERY MORNING   metFORMIN (GLUCOPHAGE) 1000 MG tablet TAKE ONE TABLET BY MOUTH TWICE DAILY   No facility-administered encounter medications on file as of 11/10/2021.  Reviewed chart for medication changes ahead of medication coordination call.  No OVs, Consults, or hospital visits since last care coordination call/Pharmacist visit.   No medication changes indicated   BP Readings from Last 3 Encounters:  05/19/21 121/79  03/22/21 130/80  12/28/20 124/80    Lab Results  Component Value Date   HGBA1C 6.1 03/22/2021     Patient obtains medications through Adherence Packaging  30 Days   Last adherence delivery included: Vitamin D3 2,000 unit at breakfast Atorvastatin 20 mg: one tablet at breakfast Metformin 1000 mg: one tablet at breakfast and at dinner Lisinopril 5 mg: one tablet at breakfast Trulicity 09.37MG- Inject into  skin weekly    Patient is due for next adherence delivery on: 11/23/21. Called patient and reviewed medications and coordinated delivery. Packs 30 DS  This delivery to include: Vitamin D3 2,000 unit at breakfast Atorvastatin 20 mg: one tablet at breakfast Metformin 1000 mg: one tablet at breakfast and at dinner Lisinopril 5 mg: one tablet at breakfast Trulicity 01.69MG- Inject into skin weekly     Patient will need a short fill of (med), prior to adherence delivery. (To align with sync date or if PRN med)  Coordinated acute fill for (med) to be delivered (date).  Patient declined the following medications (meds) due to (reason)  Patient needs refills for ***.  Confirmed delivery date of 11/23/21, advised patient that pharmacy will contact them the morning of delivery.   Care Gaps: Zoster Vaccine - Overdue COVID Booster - Overdue HGB A1C - Overdue AWV- 05/2021 [facility] Lab Results  Component Value Date   HGBA1C 6.1 03/22/2021    Star Rating Drugs: Dulaglutide (Trulicity) 06.78mg - Last filled 10/17/2021 30 DS [facility] Metformin (Glucophage) 1000 mg - Last filled 10/17/2021 30 DS [facility] Lisinopril (Zestril) 5 mg - Last filled 10/17/2021 30 DS [facility] Atorvastatin (Lipitor) 20 mg - [facility] [phone]

## 2021-11-25 ENCOUNTER — Ambulatory Visit: Payer: PPO | Admitting: Adult Health

## 2021-12-09 ENCOUNTER — Telehealth: Payer: Self-pay | Admitting: Pharmacist

## 2021-12-09 NOTE — Chronic Care Management (AMB) (Unsigned)
    Chronic Care Management Pharmacy Assistant   Name: Jonathan Calhoun  MRN: [id number] DOB: 10/23/1950  Reason for Encounter: Medication Review Medication Coordination   Recent office visits:  None  Recent consult visits:  None  Hospital visits:  None in previous 6 months  Medications: Outpatient Encounter Medications as of 12/09/2021  Medication Sig   atorvastatin (LIPITOR) 20 MG tablet TAKE ONE TABLET BY MOUTH EVERY MORNING   blood glucose meter kit and supplies KIT Dispense based on patient and insurance preference. Use up to four times daily as directed. (FOR ICD-9 250.00, 250.01).   Blood Glucose Monitoring Suppl (ONETOUCH VERIO) w/Device KIT Use to test three times daily.   Cholecalciferol (VITAMIN D3) 50 MCG (2000 UT) TABS Take 2,000 Units by mouth daily.   Dulaglutide (TRULICITY) 6.81 LX/7.2IO SOPN INJECT 0.$RemoveBefor'75mg'pDHgCQdvCexl$  into THE SKIN ONCE A WEEK   glucose blood (ONETOUCH VERIO) test strip Use to test three times daily.   Lancets (ONETOUCH ULTRASOFT) lancets Use to test three times daily.   lisinopril (ZESTRIL) 5 MG tablet TAKE ONE TABLET BY MOUTH EVERY MORNING   metFORMIN (GLUCOPHAGE) 1000 MG tablet TAKE ONE TABLET BY MOUTH TWICE DAILY   No facility-administered encounter medications on file as of 12/09/2021.    BP Readings from Last 3 Encounters:  05/19/21 121/79  03/22/21 130/80  12/28/20 124/80    Lab Results  Component Value Date   HGBA1C 6.1 03/22/2021     Patient obtains medications through Adherence Packaging  30 Days   Last adherence delivery included:  Vitamin D3 2,000 unit at breakfast Atorvastatin 20 mg: one tablet at breakfast Metformin 1000 mg: one tablet at breakfast and at dinner Lisinopril 5 mg: one tablet at breakfast Trulicity 0.35 MG- Inject into skin weekly   Patient is due for next adherence delivery on: 12/22/21. Called patient and reviewed medications and coordinated delivery. Packs 30 DS  This delivery to include: Vitamin D3 2,000 unit at  breakfast Atorvastatin 20 mg: one tablet at breakfast Metformin 1000 mg: one tablet at breakfast and at dinner Lisinopril 5 mg: one tablet at breakfast Trulicity 5.97 MG- Inject into skin weekly   Patient will need a short fill of (med), prior to adherence delivery. (To align with sync date or if PRN med)  Coordinated acute fill for (med) to be delivered (date).  Patient declined the following medications (meds) due to (reason)  Patient needs refills for ***.  Confirmed delivery date of ***, advised patient that pharmacy will contact them the morning of delivery.   Care Gaps: Zoster Vaccine - Overdue COVID Booster - Overdue HBG A1C  -Overdue BP-124/80 12/28/20 AWV- 1/23 [facility]  Star Rating Drugs: Dulaglutide (Trulicity) 4.16 mg - Last filled 11/18/2021 30 DS [facility] Metformin (Glucophage) 1000 mg - Last filled 11/18/2021 30 DS [facility] Lisinopril (Zestril) 5 mg - Last filled 11/18/2021 30 DS [facility] Atorvastatin (Lipitor) 20 mg - Last filled 11/18/2021 30 DS at [facility] Pharmacist Assistant [phone]

## 2021-12-14 ENCOUNTER — Encounter: Payer: Self-pay | Admitting: Adult Health

## 2021-12-14 ENCOUNTER — Ambulatory Visit (INDEPENDENT_AMBULATORY_CARE_PROVIDER_SITE_OTHER): Payer: PPO | Admitting: Adult Health

## 2021-12-14 VITALS — BP 130/86 | HR 85 | Temp 98.8°F | Ht 74.0 in | Wt 211.0 lb

## 2021-12-14 DIAGNOSIS — E1169 Type 2 diabetes mellitus with other specified complication: Secondary | ICD-10-CM | POA: Diagnosis not present

## 2021-12-14 DIAGNOSIS — I10 Essential (primary) hypertension: Secondary | ICD-10-CM | POA: Diagnosis not present

## 2021-12-14 DIAGNOSIS — F419 Anxiety disorder, unspecified: Secondary | ICD-10-CM

## 2021-12-14 LAB — POCT GLYCOSYLATED HEMOGLOBIN (HGB A1C): HbA1c, POC (controlled diabetic range): 5.9 % (ref 0.0–7.0)

## 2021-12-14 MED ORDER — CITALOPRAM HYDROBROMIDE 10 MG PO TABS: 10.0000 mg | ORAL_TABLET | Freq: Every day | ORAL | 1 refills | Status: DC

## 2021-12-14 MED ORDER — TRULICITY 0.75 MG/0.5ML ~~LOC~~ SOAJ: SUBCUTANEOUS | 2 refills | Status: DC

## 2021-12-14 NOTE — Progress Notes (Signed)
Subjective:    Patient ID: Jonathan Calhoun, male    DOB: Sep 19, 1950, 71 y.o.   MRN: [id number]  HPI  71 year old male who  has a past medical history of Allergy, Anxiety, Depression, Diabetes mellitus, History of alcoholism ([place]), History of shingles, Hyperlipidemia, Hypertension, and Ileus, postoperative ([place]).  He presents to the office today for follow-up regarding diabetes and hypertension  Diabetes  mellitus-maintained on metformin 1000 mg twice daily and Trulicity 4.68 mg weekly.  He denies hypoglycemic events and has been monitoring his blood sugars at home with readings consistently in the low 100s.  He continues to cut out sugars and has been drinking more water and staying active by going to the gym.  Lab Results  Component Value Date   HGBA1C 6.1 03/22/2021   Wt Readings from Last 3 Encounters:  12/14/21 211 lb (95.7 kg)  05/31/21 224 lb (101.6 kg)  05/19/21 224 lb (101.6 kg)   HTN -managed with lisinopril 5 mg daily.  He denies dizziness, lightheadedness, chest pain, or shortness of breath BP Readings from Last 3 Encounters:  12/14/21 130/86  05/19/21 121/79  03/22/21 130/80   Anxiety  - was previously on Celexa 10 mg. When he was seen in 03/2021 he reported that he weaned himself off as he felt like he did not need it any longer. Today he reports that he is having some anxiety symptoms and would like to go back on it.   Review of Systems See HPI   Past Medical History:  Diagnosis Date   Allergy    Anxiety    Depression    Diabetes mellitus    ORAL MEDS-NO INSULIN   History of alcoholism ([place])    History of shingles    Hyperlipidemia    Hypertension    Ileus, postoperative ([place])    Post-op Chole Surgery    Social History   Socioeconomic History   Marital status: Married    Spouse name: Not on file   Number of children: Not on file   Years of education: Not on file   Highest education level: Bachelor's degree (e.g., BA, AB, BS)  Occupational History    Not on file  Tobacco Use   Smoking status: Every Day    Packs/day: 0.00    Types: Cigars, Cigarettes   Smokeless tobacco: Never  Vaping Use   Vaping Use: Never used  Substance and Sexual Activity   Alcohol use: No    Comment: quit 12 yrs ago   Drug use: No   Sexual activity: Not on file  Other Topics Concern   Not on file  Social History Narrative   Married, lives with spouse.   Plans for home after surgery.   Living will, healthcare POA.   Social Determinants of Health   Financial Resource Strain: Low Risk  (11/25/2021)   Overall Financial Resource Strain (CARDIA)    Difficulty of Paying Living Expenses: Not hard at all  Food Insecurity: No Food Insecurity (11/25/2021)   Hunger Vital Sign    Worried About Running Out of Food in the Last Year: Never true    Ran Out of Food in the Last Year: Never true  Transportation Needs: No Transportation Needs (11/25/2021)   PRAPARE - Hydrologist (Medical): No    Lack of Transportation (Non-Medical): No  Physical Activity: Inactive (11/25/2021)   Exercise Vital Sign    Days of Exercise per Week: 1 day    Minutes  of Exercise per Session: 0 min  Stress: No Stress Concern Present (11/25/2021)   [place]    Feeling of Stress : Not at all  Social Connections: Moderately Integrated (11/25/2021)   Social Connection and Isolation Panel [NHANES]    Frequency of Communication with Friends and Family: More than three times a week    Frequency of Social Gatherings with Friends and Family: Three times a week    Attends Religious Services: Never    Active Member of Clubs or Organizations: Yes    Attends Archivist Meetings: More than 4 times per year    Marital Status: Married  Human resources officer Violence: Not At Risk (05/31/2021)   Humiliation, Afraid, Rape, and Kick questionnaire    Fear of Current or Ex-Partner: No    Emotionally Abused: No     Physically Abused: No    Sexually Abused: No    Past Surgical History:  Procedure Laterality Date   BONE EXOSTOSIS EXCISION  08/10/2011   Procedure: EXOSTOSIS EXCISION;  Surgeon: Marcheta Grammes, DPM;  Location: [facility];  Service: Orthopedics;  Laterality: Left;  Exostectomy of Calcaneous Left Foot   CARDIAC CATHETERIZATION  2002   CHOLECYSTECTOMY     COLON SURGERY     COLONOSCOPY  12/21/2015   Dr.Danis   ENDOVENOUS ABLATION SAPHENOUS VEIN W/ LASER Left 01/01/2019   endovenous laser ablation left greater saphenous vein by Ruta Hinds MD    OSTECTOMY Right 04/03/2018   Procedure: OSTECTOMY RIGHT CALCANEUS;  Surgeon: Caprice Beaver, DPM;  Location: [facility];  Service: Podiatry;  Laterality: Right;   RIGHT SHOULDER SURGERY FOR TEAR AND SPUR - YRS AGO     SYNOVECTOMY  08/10/2011   Procedure: SYNOVECTOMY;  Surgeon: Marcheta Grammes, DPM;  Location: [facility];  Service: Orthopedics;  Laterality: Left;  Synovectomy of Peroneal Tendon Left Foot   TENDON REPAIR  08/10/2011   Procedure: TENDON REPAIR;  Surgeon: Marcheta Grammes, DPM;  Location: [facility];  Service: Orthopedics;  Laterality: Left;  Repair of Peroneal Tendon Left Foot   TENDON REPAIR Right 04/03/2018   Procedure: EXPLORATION OF PERONEAL TENDON WITH POSSIBLE REPAIR OF LONGITUDINAL TEAR OF THE PERONEAL TENDON RIGHT FOOT;  Surgeon: Caprice Beaver, DPM;  Location: [facility];  Service: Podiatry;  Laterality: Right;   TONSILLECTOMY     TOTAL HIP ARTHROPLASTY  03/29/2011   Procedure: TOTAL HIP ARTHROPLASTY;  Surgeon: Dione Plover Aluisio;  Location: [facility];  Service: Orthopedics;  Laterality: Right;    Family History  Problem Relation Age of Onset   Depression Mother    Cancer Mother        colon   Heart failure Mother    Colon cancer Mother 63   Coronary artery disease Father    Hypertension Father     No Known Allergies  Current Outpatient Medications on File Prior to Visit  Medication Sig Dispense Refill    atorvastatin (LIPITOR) 20 MG tablet TAKE ONE TABLET BY MOUTH EVERY MORNING 90 tablet 3   blood glucose meter kit and supplies KIT Dispense based on patient and insurance preference. Use up to four times daily as directed. (FOR ICD-9 250.00, 250.01). 1 each 0   Blood Glucose Monitoring Suppl (ONETOUCH VERIO) w/Device KIT Use to test three times daily. 1 kit 0   Cholecalciferol (VITAMIN D3) 50 MCG (2000 UT) TABS Take 2,000 Units by mouth daily.     Dulaglutide (TRULICITY) 2.09 OB/0.9GG SOPN INJECT 0.57m into  THE SKIN ONCE A WEEK 6 mL 2   glucose blood (ONETOUCH VERIO) test strip Use to test three times daily. 300 each 3   Lancets (ONETOUCH ULTRASOFT) lancets Use to test three times daily. 300 each 3   lisinopril (ZESTRIL) 5 MG tablet TAKE ONE TABLET BY MOUTH EVERY MORNING 90 tablet 3   metFORMIN (GLUCOPHAGE) 1000 MG tablet TAKE ONE TABLET BY MOUTH TWICE DAILY 180 tablet 3   No current facility-administered medications on file prior to visit.    BP 130/86   Pulse 85   Temp 98.8 F (37.1 C) (Oral)   Ht 6' 2" (1.88 m)   Wt 211 lb (95.7 kg)   SpO2 98%   BMI 27.09 kg/m       Objective:   Physical Exam Vitals and nursing note reviewed.  Constitutional:      Appearance: Normal appearance.  Cardiovascular:     Rate and Rhythm: Normal rate and regular rhythm.     Pulses: Normal pulses.     Heart sounds: Normal heart sounds.  Pulmonary:     Effort: Pulmonary effort is normal.     Breath sounds: Normal breath sounds.  Musculoskeletal:        General: Normal range of motion.  Skin:    General: Skin is warm and dry.     Capillary Refill: Capillary refill takes less than 2 seconds.  Neurological:     General: No focal deficit present.     Mental Status: He is alert and oriented to person, place, and time.  Psychiatric:        Mood and Affect: Mood normal.        Behavior: Behavior normal.        Thought Content: Thought content normal.        Judgment: Judgment normal.        Assessment & Plan:  1. Type 2 diabetes mellitus with other specified complication, without long-term current use of insulin (HCC)  - POC HgB A1c- 5.9  - He has done well with weight loss and A1c has dropped.  - Will have him come off metformin for the next 6 months and will recheck at his physical exam  - Dulaglutide (TRULICITY) 6.16 WV/3.7TG SOPN; INJECT 0.68m into THE SKIN ONCE A WEEK  Dispense: 6 mL; Refill: 2  2. Essential hypertension - well controlled.  - No change in medications   3. Anxiety  - citalopram (CELEXA) 10 MG tablet; Take 1 tablet (10 mg total) by mouth daily.  Dispense: 90 tablet; Refill: 1  CDorothyann Peng NP

## 2022-01-10 ENCOUNTER — Telehealth: Payer: Self-pay | Admitting: Pharmacist

## 2022-01-10 NOTE — Chronic Care Management (AMB) (Unsigned)
    Chronic Care Management Pharmacy Assistant   Name: Jonathan Calhoun  MRN: [id number] DOB: 1951-05-06  Reason for Encounter: Medication Review Medication Coordination   Recent office visits:  12/14/21 [name], NP - Patient presented for Type 2 diabetes mellitus with other specified complication without long-term current use of insulin. Prescribed Citalopram Hydrobromide. Stopped Metformin HCL.  Recent consult visits:  None  Hospital visits:  None in previous 6 months  Medications: Outpatient Encounter Medications as of 01/10/2022  Medication Sig   atorvastatin (LIPITOR) 20 MG tablet TAKE ONE TABLET BY MOUTH EVERY MORNING   Blood Glucose Monitoring Suppl (ONETOUCH VERIO) w/Device KIT Use to test three times daily.   Cholecalciferol (VITAMIN D3) 50 MCG (2000 UT) TABS Take 2,000 Units by mouth daily.   citalopram (CELEXA) 10 MG tablet Take 1 tablet (10 mg total) by mouth daily.   Dulaglutide (TRULICITY) 0.01 UJ/9.3VF SOPN INJECT 0.73m into THE SKIN ONCE A WEEK   glucose blood (ONETOUCH VERIO) test strip Use to test three times daily.   Lancets (ONETOUCH ULTRASOFT) lancets Use to test three times daily.   lisinopril (ZESTRIL) 5 MG tablet TAKE ONE TABLET BY MOUTH EVERY MORNING   No facility-administered encounter medications on file as of 01/10/2022.  Reviewed chart for medication changes ahead of medication coordination call.  BP Readings from Last 3 Encounters:  12/14/21 130/86  05/19/21 121/79  03/22/21 130/80    Lab Results  Component Value Date   HGBA1C 5.9 12/14/2021     Patient obtains medications through Adherence Packaging  30 Days   Last adherence delivery included: Vitamin D3 2,000 unit at breakfast Atorvastatin 20 mg: one tablet at breakfast Metformin 1000 mg: one tablet at breakfast and at dinner Lisinopril 5 mg: one tablet at breakfast Trulicity 04.09MG- Inject into skin weekly   Patient is due for next adherence delivery on: 01/23/22. Called patient  and reviewed medications and coordinated delivery. Packs 30 DS  This delivery to include: Vitamin D3 2,000 unit at breakfast Atorvastatin 20 mg: one tablet at breakfast Lisinopril 5 mg: one tablet at breakfast Trulicity 00.50MG- Inject into skin weekly  Citalopram 10 mg: one tablet at breakfast  Patient aware metformin is not in packaging due to PCP Stop  Confirmed delivery date of 01/23/22, advised patient that pharmacy will contact them the morning of delivery.   Care Gaps: Zoster Vaccine - Overdue COVID Booster - Overdue Flu Vaccine - Overdue Eye Exam - Overdue BP- 130/86 12/14/21 AWV- 1/23 Lab Results  Component Value Date   HGBA1C 5.9 12/14/2021    Star Rating Drugs: Dulaglutide (Trulicity) 02.56mg - Last filled 12/15/2021 [facility] Lisinopril (Zestril) 5 mg - Last filled 12/15/2021 30 [facility] Atorvastatin (Lipitor) 20 mg - Last filled 12/15/2021 [facility] [phone]

## 2022-01-11 DIAGNOSIS — E119 Type 2 diabetes mellitus without complications: Secondary | ICD-10-CM | POA: Diagnosis not present

## 2022-01-11 DIAGNOSIS — Z961 Presence of intraocular lens: Secondary | ICD-10-CM | POA: Diagnosis not present

## 2022-02-09 ENCOUNTER — Telehealth: Payer: Self-pay | Admitting: Pharmacist

## 2022-02-09 NOTE — Chronic Care Management (AMB) (Signed)
    Chronic Care Management Pharmacy Assistant   Name: Jonathan Calhoun  MRN: [id number] DOB: 02/06/1951  Reason for Encounter: Medication Review / Medication Coordination    Recent office visits:  None   Recent consult visits:  None  Hospital visits:  None in previous 6 months  Medications: Outpatient Encounter Medications as of 02/09/2022  Medication Sig   atorvastatin (LIPITOR) 20 MG tablet TAKE ONE TABLET BY MOUTH EVERY MORNING   Blood Glucose Monitoring Suppl (ONETOUCH VERIO) w/Device KIT Use to test three times daily.   Cholecalciferol (VITAMIN D3) 50 MCG (2000 UT) TABS Take 2,000 Units by mouth daily.   citalopram (CELEXA) 10 MG tablet Take 1 tablet (10 mg total) by mouth daily.   Dulaglutide (TRULICITY) 6.14 ER/1.5QM SOPN INJECT 0.12m into THE SKIN ONCE A WEEK   glucose blood (ONETOUCH VERIO) test strip Use to test three times daily.   Lancets (ONETOUCH ULTRASOFT) lancets Use to test three times daily.   lisinopril (ZESTRIL) 5 MG tablet TAKE ONE TABLET BY MOUTH EVERY MORNING   No facility-administered encounter medications on file as of 02/09/2022.  Reviewed chart for medication changes ahead of medication coordination call.   BP Readings from Last 3 Encounters:  12/14/21 130/86  05/19/21 121/79  03/22/21 130/80    Lab Results  Component Value Date   HGBA1C 5.9 12/14/2021     Patient obtains medications through Adherence Packaging  30 Days   Last adherence delivery included: Vitamin D3 2,000 unit at breakfast Atorvastatin 20 mg: one tablet at breakfast Lisinopril 5 mg: one tablet at breakfast Trulicity 00.86MG- Inject into skin weekly  Citalopram 10 mg: one tablet at breakfast   Patient aware metformin is not in packaging due to PCP Stop   Confirmed delivery date of 01/23/22, advised patient that pharmacy will contact them the morning of delivery.     Patient is due for next adherence delivery on: 02/21/22. Called patient and reviewed medications and  coordinated delivery. Packs 30 DS  This delivery to include: Vitamin D3 2,000 unit at breakfast Atorvastatin 20 mg: one tablet at breakfast Lisinopril 5 mg: one tablet at breakfast Trulicity 07.61MG- Inject into skin weekly  Citalopram 10 mg: one tablet at breakfast   Unable to reach to confirm delivery date of 02/21/22, left message that pharmacy will contact them the morning of delivery.   Care Gaps: Zoster Vaccine - Overdue Diabetic Kidney Eval (Urine) - Overdue COVID Booster - Overdue Flu Vaccine - Overdue Eye Exam - Overdue TDAP - Overdue BP- 130/86 12/14/21 AWV- 1/23 Lab Results  Component Value Date   HGBA1C 5.9 12/14/2021    Star Rating Drugs: Dulaglutide (Trulicity) 09.50mg - Last filled 01/18/2022 30 [facility] Lisinopril (Zestril) 5 mg - Last filled 01/18/2022 30 [facility] Atorvastatin (Lipitor) 20 mg - Last filled [facility]/18/2022 [facility] [phone]

## 2022-03-10 ENCOUNTER — Telehealth: Payer: Self-pay | Admitting: Pharmacist

## 2022-03-10 NOTE — Chronic Care Management (AMB) (Signed)
    Chronic Care Management Pharmacy Assistant   Name: Jonathan Calhoun  MRN: [id number] DOB: 09/22/1950  Reason for Encounter: Medication Review Medication Coordination    Recent office visits:  None  Recent consult visits:  None  Hospital visits:  None in previous 6 months  Medications: Outpatient Encounter Medications as of 03/10/2022  Medication Sig   atorvastatin (LIPITOR) 20 MG tablet TAKE ONE TABLET BY MOUTH EVERY MORNING   Blood Glucose Monitoring Suppl (ONETOUCH VERIO) w/Device KIT Use to test three times daily.   Cholecalciferol (VITAMIN D3) 50 MCG (2000 UT) TABS Take 2,000 Units by mouth daily.   citalopram (CELEXA) 10 MG tablet Take 1 tablet (10 mg total) by mouth daily.   Dulaglutide (TRULICITY) 4.26 ST/4.1DQ SOPN INJECT 0.$RemoveBefor'75mg'mjVGxPDasoGo$  into THE SKIN ONCE A WEEK   glucose blood (ONETOUCH VERIO) test strip Use to test three times daily.   Lancets (ONETOUCH ULTRASOFT) lancets Use to test three times daily.   lisinopril (ZESTRIL) 5 MG tablet TAKE ONE TABLET BY MOUTH EVERY MORNING   No facility-administered encounter medications on file as of 03/10/2022.   Reviewed chart for medication changes ahead of medication coordination call.  BP Readings from Last 3 Encounters:  12/14/21 130/86  05/19/21 121/79  03/22/21 130/80    Lab Results  Component Value Date   HGBA1C 5.9 12/14/2021     Patient obtains medications through Adherence Packaging  30 Days   Last adherence delivery included:  Vitamin D3 2,000 unit at breakfast Atorvastatin 20 mg: one tablet at breakfast Lisinopril 5 mg: one tablet at breakfast Trulicity 2.22 MG- Inject into skin weekly  Citalopram 10 mg: one tablet at breakfast   Patient is due for next adherence delivery on: 03/22/22. Called patient and reviewed medications and coordinated delivery.  This delivery to include: Vitamin D3 2,000 unit at breakfast Atorvastatin 20 mg: one tablet at breakfast Lisinopril 5 mg: one tablet at breakfast Trulicity  9.79 MG- Inject into skin weekly  Citalopram 10 mg: one tablet at breakfast   Confirmed delivery date of 03/22/22, advised patient that pharmacy will contact them the morning of delivery.   Care Gaps: Zoster Vaccine - Overdue Diabetic Urine - Overdue COVID Booster - Overdue Flu Vaccine - Overdue Eye Exam - Overdue TDAP - Overdue Lab Results  Component Value Date   HGBA1C 5.9 12/14/2021     Star Rating Drugs: Dulaglutide (Trulicity) 8.92 mg - Last filled 02/17/2022 30 [facility] Lisinopril (Zestril) 5 mg - Last filled 02/17/2022 30 [facility] Atorvastatin (Lipitor) 20 mg - Last filled 02/17/2022 30 DS at [facility] Pharmacist Assistant [phone]

## 2022-03-24 ENCOUNTER — Encounter: Payer: PPO | Admitting: Adult Health

## 2022-04-10 ENCOUNTER — Telehealth: Payer: Self-pay | Admitting: Pharmacist

## 2022-04-10 NOTE — Chronic Care Management (AMB) (Signed)
    Chronic Care Management Pharmacy Assistant   Name: Jonathan Calhoun  MRN: [id number] DOB: Feb 15, 1951  Reason for Encounter: Medication Review Medication Coordination    Recent office visits:  None   Recent consult visits:  None  Hospital visits:  None in previous 6 months  Medications: Outpatient Encounter Medications as of 04/10/2022  Medication Sig   atorvastatin (LIPITOR) 20 MG tablet TAKE ONE TABLET BY MOUTH EVERY MORNING   Blood Glucose Monitoring Suppl (ONETOUCH VERIO) w/Device KIT Use to test three times daily.   Cholecalciferol (VITAMIN D3) 50 MCG (2000 UT) TABS Take 2,000 Units by mouth daily.   citalopram (CELEXA) 10 MG tablet Take 1 tablet (10 mg total) by mouth daily.   Dulaglutide (TRULICITY) 4.65 KC/1.2XN SOPN INJECT 0.37m into THE SKIN ONCE A WEEK   glucose blood (ONETOUCH VERIO) test strip Use to test three times daily.   Lancets (ONETOUCH ULTRASOFT) lancets Use to test three times daily.   lisinopril (ZESTRIL) 5 MG tablet TAKE ONE TABLET BY MOUTH EVERY MORNING   No facility-administered encounter medications on file as of 04/10/2022.   Reviewed chart for medication changes ahead of medication coordination call.   BP Readings from Last 3 Encounters:  12/14/21 130/86  05/19/21 121/79  03/22/21 130/80    Lab Results  Component Value Date   HGBA1C 5.9 12/14/2021     Patient obtains medications through Adherence Packaging  30 Days   Last adherence delivery included:  Vitamin D3 2,000 unit at breakfast Atorvastatin 20 mg: one tablet at breakfast Lisinopril 5 mg: one tablet at breakfast Trulicity 01.70MG- Inject into skin weekly  Citalopram 10 mg: one tablet at breakfast     Confirmed delivery date of 03/22/22, advised patient that pharmacy will contact them the morning of delivery.    Patient is due for next adherence delivery on: 04/21/22. Called patient and reviewed medications and coordinated delivery. Packs 30 DS  This delivery to  include: Vitamin D3 2,000 unit at breakfast Atorvastatin 20 mg: one tablet at breakfast Lisinopril 5 mg: one tablet at breakfast Trulicity 00.17MG- Inject into skin weekly  Citalopram 10 mg: one tablet at breakfast      Unable to reach to confirm delivery date of 04/21/22, advised patient that pharmacy will contact them the morning of delivery.   Care Gaps: Zoster Vaccine - Overdue Diabetic Urine - Overdue COVID Booster - Overdue Flu Vaccine - Overdue Eye Exam - Overdue Diabetic Urine - Overdue Foot Exam - Overdue AWV- 05/31/21 BP- 130/86 12/14/21 Lab Results  Component Value Date   HGBA1C 5.9 12/14/2021    Star Rating Drugs: Dulaglutide (Trulicity) 04.94mg - Last filled 03/17/2022 30 [facility] Lisinopril (Zestril) 5 mg - Last filled 03/17/2022 30 [facility] Atorvastatin (Lipitor) 20 mg - Last filled 03/17/2022 [facility] [phone]

## 2022-04-18 ENCOUNTER — Other Ambulatory Visit: Payer: Self-pay | Admitting: Adult Health

## 2022-04-18 DIAGNOSIS — I10 Essential (primary) hypertension: Secondary | ICD-10-CM

## 2022-04-18 DIAGNOSIS — Z760 Encounter for issue of repeat prescription: Secondary | ICD-10-CM

## 2022-05-08 ENCOUNTER — Other Ambulatory Visit: Payer: Self-pay | Admitting: Adult Health

## 2022-05-09 ENCOUNTER — Telehealth: Payer: Self-pay | Admitting: Pharmacist

## 2022-05-09 NOTE — Chronic Care Management (AMB) (Signed)
    Chronic Care Management Pharmacy Assistant   Name: Jonathan Calhoun  MRN: [id number] DOB: [birth year]  Reason for Encounter: Medication Review Medication Coordination   Recent office visits:  None  Recent consult visits:  None  Hospital visits:  None in previous 6 months  Medications: Outpatient Encounter Medications as of 05/09/2022  Medication Sig   atorvastatin (LIPITOR) 20 MG tablet TAKE ONE TABLET BY MOUTH EVERY MORNING   Blood Glucose Monitoring Suppl (ONETOUCH VERIO) w/Device KIT Use to test three times daily.   Cholecalciferol (VITAMIN D3) 50 MCG (2000 UT) TABS Take 2,000 Units by mouth daily.   citalopram (CELEXA) 10 MG tablet Take 1 tablet (10 mg total) by mouth daily.   Dulaglutide (TRULICITY) 1.00 FH/2.1FX SOPN INJECT 0.38m into THE SKIN ONCE A WEEK   glucose blood (ONETOUCH VERIO) test strip Use to test three times daily.   Lancets (ONETOUCH ULTRASOFT) lancets Use to test three times daily.   lisinopril (ZESTRIL) 5 MG tablet TAKE ONE TABLET BY MOUTH EVERY MORNING   No facility-administered encounter medications on file as of 05/09/2022.   Reviewed chart for medication changes ahead of medication coordination call.  BP Readings from Last 3 Encounters:  12/14/21 130/86  05/19/21 121/79  03/22/21 130/80    Lab Results  Component Value Date   HGBA1C 5.9 12/14/2021     Patient obtains medications through Adherence Packaging  30 Days   Last adherence delivery included: Vitamin D3 2,000 unit at breakfast Atorvastatin 20 mg: one tablet at breakfast Lisinopril 5 mg: one tablet at breakfast Trulicity 05.88MG- Inject into skin weekly  Citalopram 10 mg: one tablet at breakfast       Unable to reach to confirm delivery date of 04/21/22, advised patient that pharmacy will contact them the morning of delivery.    Patient is due for next adherence delivery on: 05/22/22. Called patient and reviewed medications and coordinated delivery. Packs 30 DS  This  delivery to include: Vitamin D3 2,000 unit at breakfast Atorvastatin 20 mg: one tablet at breakfast Lisinopril 5 mg: one tablet at breakfast Trulicity 03.25MG- Inject into skin weekly  Citalopram 10 mg: one tablet at breakfast   Confirmed delivery date of 05/22/22, advised patient that pharmacy will contact them the morning of delivery.   Care Gaps: Zoster Vaccine - Overdue Diabetic Urine - Overdue Flu Vaccine - Overdue Eye Exam - Overdue COVID Booster - Overdue TDAP - Overdue Foot Exam - Overdue AWV- 05/31/21 Bp- 130/86 12/14/21 Lab Results  Component Value Date   HGBA1C 5.9 12/14/2021     Star Rating Drugs: Dulaglutide (Trulicity) 04.98mg - Last filled 04/18/2022 30 [facility] Lisinopril (Zestril) 5 mg - Last filled 04/19/2022 30 [facility] Atorvastatin (Lipitor) 20 mg - Last filled 04/17/2022 [facility] [phone]

## 2022-05-11 ENCOUNTER — Ambulatory Visit (INDEPENDENT_AMBULATORY_CARE_PROVIDER_SITE_OTHER): Payer: PPO | Admitting: Adult Health

## 2022-05-11 ENCOUNTER — Encounter: Payer: Self-pay | Admitting: Adult Health

## 2022-05-11 VITALS — BP 120/80 | HR 58 | Temp 98.1°F | Ht 72.05 in | Wt 218.0 lb

## 2022-05-11 DIAGNOSIS — Z0000 Encounter for general adult medical examination without abnormal findings: Secondary | ICD-10-CM | POA: Diagnosis not present

## 2022-05-11 DIAGNOSIS — N401 Enlarged prostate with lower urinary tract symptoms: Secondary | ICD-10-CM

## 2022-05-11 DIAGNOSIS — I10 Essential (primary) hypertension: Secondary | ICD-10-CM | POA: Diagnosis not present

## 2022-05-11 DIAGNOSIS — R351 Nocturia: Secondary | ICD-10-CM

## 2022-05-11 DIAGNOSIS — G629 Polyneuropathy, unspecified: Secondary | ICD-10-CM

## 2022-05-11 DIAGNOSIS — Z23 Encounter for immunization: Secondary | ICD-10-CM

## 2022-05-11 DIAGNOSIS — E1169 Type 2 diabetes mellitus with other specified complication: Secondary | ICD-10-CM | POA: Diagnosis not present

## 2022-05-11 DIAGNOSIS — E782 Mixed hyperlipidemia: Secondary | ICD-10-CM

## 2022-05-11 LAB — CBC WITH DIFFERENTIAL/PLATELET
Basophils Absolute: 0.1 10*3/uL (ref 0.0–0.1)
Basophils Relative: 0.7 % (ref 0.0–3.0)
Eosinophils Absolute: 0.2 10*3/uL (ref 0.0–0.7)
Eosinophils Relative: 2.5 % (ref 0.0–5.0)
HCT: 42.9 % (ref 39.0–52.0)
Hemoglobin: 14.7 g/dL (ref 13.0–17.0)
Lymphocytes Relative: 45.8 % (ref 12.0–46.0)
Lymphs Abs: 3.2 10*3/uL (ref 0.7–4.0)
MCHC: 34.2 g/dL (ref 30.0–36.0)
MCV: 92.7 fl (ref 78.0–100.0)
Monocytes Absolute: 0.6 10*3/uL (ref 0.1–1.0)
Monocytes Relative: 8.8 % (ref 3.0–12.0)
Neutro Abs: 3 10*3/uL (ref 1.4–7.7)
Neutrophils Relative %: 42.2 % — ABNORMAL LOW (ref 43.0–77.0)
Platelets: 213 10*3/uL (ref 150.0–400.0)
RBC: 4.62 Mil/uL (ref 4.22–5.81)
RDW: 14.9 % (ref 11.5–15.5)
WBC: 7.1 10*3/uL (ref 4.0–10.5)

## 2022-05-11 LAB — MICROALBUMIN / CREATININE URINE RATIO
Creatinine,U: 130.9 mg/dL
Microalb Creat Ratio: 0.9 mg/g (ref 0.0–30.0)
Microalb, Ur: 1.1 mg/dL (ref 0.0–1.9)

## 2022-05-11 LAB — COMPREHENSIVE METABOLIC PANEL
ALT: 30 U/L (ref 0–53)
AST: 24 U/L (ref 0–37)
Albumin: 4.3 g/dL (ref 3.5–5.2)
Alkaline Phosphatase: 84 U/L (ref 39–117)
BUN: 15 mg/dL (ref 6–23)
CO2: 28 mEq/L (ref 19–32)
Calcium: 9.5 mg/dL (ref 8.4–10.5)
Chloride: 102 mEq/L (ref 96–112)
Creatinine, Ser: 0.83 mg/dL (ref 0.40–1.50)
GFR: 87.44 mL/min (ref >60.00)
Glucose, Bld: 106 mg/dL — ABNORMAL HIGH (ref 70–99)
Potassium: 4.4 mEq/L (ref 3.5–5.1)
Sodium: 139 mEq/L (ref 135–145)
Total Bilirubin: 0.8 mg/dL (ref 0.2–1.2)
Total Protein: 6.9 g/dL (ref 6.0–8.3)

## 2022-05-11 LAB — PSA: PSA: 0.28 ng/mL (ref 0.10–4.00)

## 2022-05-11 LAB — HEMOGLOBIN A1C: Hgb A1c MFr Bld: 6.2 % (ref 4.6–6.5)

## 2022-05-11 LAB — LIPID PANEL
Cholesterol: 128 mg/dL (ref 0–200)
HDL: 43 mg/dL (ref >39.00)
LDL Cholesterol: 67 mg/dL (ref 0–99)
NonHDL: 84.82
Total CHOL/HDL Ratio: 3
Triglycerides: 87 mg/dL (ref 0.0–149.0)
VLDL: 17.4 mg/dL (ref 0.0–40.0)

## 2022-05-11 LAB — TSH: TSH: 2.23 u[IU]/mL (ref 0.35–5.50)

## 2022-05-11 MED ORDER — PREDNISONE 20 MG PO TABS: 20.0000 mg | ORAL_TABLET | Freq: Every day | ORAL | 0 refills | Status: DC

## 2022-05-11 NOTE — Progress Notes (Signed)
Subjective:    Patient ID: Jonathan Calhoun, male    DOB: Apr 02, 1951, 71 y.o.   MRN: [id number]  HPI  Patient presents for yearly preventative medicine examination. He is a pleasant 71 year old male who  has a past medical history of Allergy, Anxiety, Depression, Diabetes mellitus, History of alcoholism ([place]), History of shingles, Hyperlipidemia, Hypertension, and Ileus, postoperative ([place]).  DM - maintained on Trulicity 5.45 mg weekly.  He denies hypoglycemic events and that has been monitoring his blood sugars at home with readings consistently in the low 100s. Lab Results  Component Value Date   HGBA1C 5.9 12/14/2021   HTN -continue lisinopril 5 mg daily.  He denies dizziness, lightheadedness, chest pain, or shortness of breath BP Readings from Last 3 Encounters:  05/11/22 120/80  12/14/21 130/86  05/19/21 121/79   Hyperlipidemia -managed with Lipitor 20 mg daily.  He denies myalgia or fatigue  Lab Results  Component Value Date   CHOL 134 03/22/2021   HDL 41.70 03/22/2021   LDLCALC 72 03/22/2021   TRIG 102.0 03/22/2021   CHOLHDL 3 03/22/2021   BPH - asymptomatic   Abdominal Pain - Reports that over the last month he has had a " stinging and electrical pain" that starts midsternum and radiates around to right mid flank. This is the same area where he had shingles in the past but this was " many years ago". His pain happens about two times a day and lasts for a few seconds.   All immunizations and health maintenance protocols were reviewed with the patient and needed orders were placed.  Appropriate screening laboratory values were ordered for the patient including screening of hyperlipidemia, renal function and hepatic function. If indicated by BPH, a PSA was ordered.  Medication reconciliation,  past medical history, social history, problem list and allergies were reviewed in detail with the patient  Goals were established with regard to weight loss, exercise, and  diet in  compliance with medications Wt Readings from Last 3 Encounters:  05/11/22 218 lb (98.9 kg)  12/14/21 211 lb (95.7 kg)  05/31/21 224 lb (101.6 kg)   He is up to date on routine colon cancer screening   Review of Systems  Constitutional: Negative.   HENT: Negative.    Eyes: Negative.   Respiratory: Negative.    Cardiovascular: Negative.   Gastrointestinal:  Positive for abdominal pain.  Endocrine: Negative.   Genitourinary: Negative.   Musculoskeletal: Negative.   Skin: Negative.   Allergic/Immunologic: Negative.   Neurological: Negative.   Hematological: Negative.   Psychiatric/Behavioral: Negative.    All other systems reviewed and are negative.  Past Medical History:  Diagnosis Date   Allergy    Anxiety    Depression    Diabetes mellitus    ORAL MEDS-NO INSULIN   History of alcoholism ([place])    History of shingles    Hyperlipidemia    Hypertension    Ileus, postoperative ([place])    Post-op Chole Surgery    Social History   Socioeconomic History   Marital status: Married    Spouse name: Not on file   Number of children: Not on file   Years of education: Not on file   Highest education level: Bachelor's degree (e.g., BA, AB, BS)  Occupational History   Not on file  Tobacco Use   Smoking status: Every Day    Packs/day: 0.00    Types: Cigars, Cigarettes   Smokeless tobacco: Never  Vaping Use   Vaping  Use: Never used  Substance and Sexual Activity   Alcohol use: No    Comment: quit 12 yrs ago   Drug use: No   Sexual activity: Not on file  Other Topics Concern   Not on file  Social History Narrative   Married, lives with spouse.   Plans for home after surgery.   Living will, healthcare POA.   Social Determinants of Health   Financial Resource Strain: Low Risk  (11/25/2021)   Overall Financial Resource Strain (CARDIA)    Difficulty of Paying Living Expenses: Not hard at all  Food Insecurity: No Food Insecurity (11/25/2021)   Hunger Vital Sign    Worried  About Running Out of Food in the Last Year: Never true    Ran Out of Food in the Last Year: Never true  Transportation Needs: No Transportation Needs (11/25/2021)   PRAPARE - Hydrologist (Medical): No    Lack of Transportation (Non-Medical): No  Physical Activity: Inactive (11/25/2021)   Exercise Vital Sign    Days of Exercise per Week: 1 day    Minutes of Exercise per Session: 0 min  Stress: No Stress Concern Present (11/25/2021)   [place]    Feeling of Stress : Not at all  Social Connections: Moderately Integrated (11/25/2021)   Social Connection and Isolation Panel [NHANES]    Frequency of Communication with Friends and Family: More than three times a week    Frequency of Social Gatherings with Friends and Family: Three times a week    Attends Religious Services: Never    Active Member of Clubs or Organizations: Yes    Attends Archivist Meetings: More than 4 times per year    Marital Status: Married  Human resources officer Violence: Not At Risk (05/31/2021)   Humiliation, Afraid, Rape, and Kick questionnaire    Fear of Current or Ex-Partner: No    Emotionally Abused: No    Physically Abused: No    Sexually Abused: No    Past Surgical History:  Procedure Laterality Date   BONE EXOSTOSIS EXCISION  08/10/2011   Procedure: EXOSTOSIS EXCISION;  Surgeon: Marcheta Grammes, DPM;  Location: [facility];  Service: Orthopedics;  Laterality: Left;  Exostectomy of Calcaneous Left Foot   CARDIAC CATHETERIZATION  2002   CHOLECYSTECTOMY     COLON SURGERY     COLONOSCOPY  12/21/2015   Dr.Danis   ENDOVENOUS ABLATION SAPHENOUS VEIN W/ LASER Left 01/01/2019   endovenous laser ablation left greater saphenous vein by Ruta Hinds MD    OSTECTOMY Right 04/03/2018   Procedure: OSTECTOMY RIGHT CALCANEUS;  Surgeon: Caprice Beaver, DPM;  Location: [facility];  Service: Podiatry;  Laterality: Right;    RIGHT SHOULDER SURGERY FOR TEAR AND SPUR - YRS AGO     SYNOVECTOMY  08/10/2011   Procedure: SYNOVECTOMY;  Surgeon: Marcheta Grammes, DPM;  Location: [facility];  Service: Orthopedics;  Laterality: Left;  Synovectomy of Peroneal Tendon Left Foot   TENDON REPAIR  08/10/2011   Procedure: TENDON REPAIR;  Surgeon: Marcheta Grammes, DPM;  Location: [facility];  Service: Orthopedics;  Laterality: Left;  Repair of Peroneal Tendon Left Foot   TENDON REPAIR Right 04/03/2018   Procedure: EXPLORATION OF PERONEAL TENDON WITH POSSIBLE REPAIR OF LONGITUDINAL TEAR OF THE PERONEAL TENDON RIGHT FOOT;  Surgeon: Caprice Beaver, DPM;  Location: [facility];  Service: Podiatry;  Laterality: Right;   TONSILLECTOMY     TOTAL HIP  ARTHROPLASTY  03/29/2011   Procedure: TOTAL HIP ARTHROPLASTY;  Surgeon: Dione Plover Aluisio;  Location: [facility];  Service: Orthopedics;  Laterality: Right;    Family History  Problem Relation Age of Onset   Depression Mother    Cancer Mother        colon   Heart failure Mother    Colon cancer Mother 53   Coronary artery disease Father    Hypertension Father     No Known Allergies  Current Outpatient Medications on File Prior to Visit  Medication Sig Dispense Refill   atorvastatin (LIPITOR) 20 MG tablet TAKE ONE TABLET BY MOUTH EVERY MORNING 90 tablet 3   Blood Glucose Monitoring Suppl (ONETOUCH VERIO) w/Device KIT Use to test three times daily. 1 kit 0   Cholecalciferol (VITAMIN D3) 50 MCG (2000 UT) TABS Take 2,000 Units by mouth daily.     citalopram (CELEXA) 10 MG tablet Take 1 tablet (10 mg total) by mouth daily. 90 tablet 1   Dulaglutide (TRULICITY) 2.45 YK/9.9IP SOPN INJECT 0.63m into THE SKIN ONCE A WEEK 6 mL 2   glucose blood (ONETOUCH VERIO) test strip Use to test three times daily. 300 each 3   Lancets (ONETOUCH ULTRASOFT) lancets Use to test three times daily. 300 each 3   lisinopril (ZESTRIL) 5 MG tablet TAKE ONE TABLET BY MOUTH EVERY MORNING 90 tablet 3   No current  facility-administered medications on file prior to visit.    BP 120/80   Pulse (!) 58   Temp 98.1 F (36.7 C) (Oral)   Ht 6' 0.05" (1.83 m)   Wt 218 lb (98.9 kg)   SpO2 96%   BMI 29.53 kg/m       Objective:   Physical Exam Vitals and nursing note reviewed.  Constitutional:      General: He is not in acute distress.    Appearance: Normal appearance. He is well-developed and normal weight.  HENT:     Head: Normocephalic and atraumatic.     Right Ear: Tympanic membrane, ear canal and external ear normal. There is no impacted cerumen.     Left Ear: Tympanic membrane, ear canal and external ear normal. There is no impacted cerumen.     Nose: Nose normal. No congestion or rhinorrhea.     Mouth/Throat:     Mouth: Mucous membranes are moist.     Pharynx: Oropharynx is clear. No oropharyngeal exudate or posterior oropharyngeal erythema.  Eyes:     General:        Right eye: No discharge.        Left eye: No discharge.     Extraocular Movements: Extraocular movements intact.     Conjunctiva/sclera: Conjunctivae normal.     Pupils: Pupils are equal, round, and reactive to light.  Neck:     Vascular: No carotid bruit.     Trachea: No tracheal deviation.  Cardiovascular:     Rate and Rhythm: Normal rate and regular rhythm.     Pulses: Normal pulses.     Heart sounds: Normal heart sounds. No murmur heard.    No friction rub. No gallop.  Pulmonary:     Effort: Pulmonary effort is normal. No respiratory distress.     Breath sounds: Normal breath sounds. No stridor. No wheezing, rhonchi or rales.  Chest:     Chest wall: No tenderness.  Abdominal:     General: Bowel sounds are normal. There is no distension.     Palpations: Abdomen is soft. There  is no mass.     Tenderness: There is abdominal tenderness. There is no right CVA tenderness, left CVA tenderness, guarding or rebound.     Hernia: No hernia is present.    Musculoskeletal:        General: No swelling, tenderness,  deformity or signs of injury. Normal range of motion.     Right lower leg: No edema.     Left lower leg: No edema.  Lymphadenopathy:     Cervical: No cervical adenopathy.  Skin:    General: Skin is warm and dry.     Capillary Refill: Capillary refill takes less than 2 seconds.     Coloration: Skin is not jaundiced or pale.     Findings: No bruising, erythema, lesion or rash.  Neurological:     General: No focal deficit present.     Mental Status: He is alert and oriented to person, place, and time.     Cranial Nerves: No cranial nerve deficit.     Sensory: No sensory deficit.     Motor: No weakness.     Coordination: Coordination normal.     Gait: Gait normal.     Deep Tendon Reflexes: Reflexes normal.  Psychiatric:        Mood and Affect: Mood normal.        Behavior: Behavior normal.        Thought Content: Thought content normal.        Judgment: Judgment normal.       Assessment & Plan:  1. Routine general medical examination at a health care facility Today patient counseled on age appropriate routine health concerns for screening and prevention, each reviewed and up to date or declined. Immunizations reviewed and up to date or declined. Labs ordered and reviewed. Risk factors for depression reviewed and negative. Hearing function and visual acuity are intact. ADLs screened and addressed as needed. Functional ability and level of safety reviewed and appropriate. Education, counseling and referrals performed based on assessed risks today. Patient provided with a copy of personalized plan for preventive services.   2. Type 2 diabetes mellitus with other specified complication, without long-term current use of insulin (HCC) - Consider increase in trulicity  - Follow up in 6 months likely  - CBC with Differential/Platelet; Future - Comprehensive metabolic panel; Future - Lipid panel; Future - TSH; Future - Hemoglobin A1c; Future - Microalbumin/Creatinine Ratio, Urine;  Future  3. Essential hypertension - Well controlled. No change in medication  - CBC with Differential/Platelet; Future - Comprehensive metabolic panel; Future - Lipid panel; Future - TSH; Future - Hemoglobin A1c; Future - Microalbumin/Creatinine Ratio, Urine; Future  4. BPH associated with nocturia  - PSA; Future  5. Mixed hyperlipidemia - Consider increase in statin  - CBC with Differential/Platelet; Future - Comprehensive metabolic panel; Future - Lipid panel; Future - TSH; Future - Hemoglobin A1c; Future  6. 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 Will trial him on Prednisone. Can consider CT abdomen in the future  - predniSONE (DELTASONE) 20 MG tablet; Take 1 tablet (20 mg total) by mouth daily with breakfast.  Dispense: 7 tablet; Refill: 0  [name], NP

## 2022-05-11 NOTE — Patient Instructions (Signed)
It was great seeing you today   We will follow up with you regarding your lab work   Please let me know if you need anything   Follow up in 6 months for diabetic check

## 2022-06-01 ENCOUNTER — Ambulatory Visit (INDEPENDENT_AMBULATORY_CARE_PROVIDER_SITE_OTHER): Payer: PPO

## 2022-06-01 VITALS — Ht 72.0 in | Wt 218.0 lb

## 2022-06-01 DIAGNOSIS — Z0000 Encounter for general adult medical examination without abnormal findings: Secondary | ICD-10-CM

## 2022-06-01 NOTE — Patient Instructions (Addendum)
Jonathan Calhoun , Thank you for taking time to come for your Medicare Wellness Visit. I appreciate your ongoing commitment to your health goals. Please review the following plan we discussed and let me know if I can assist you in the future.   These are the goals we discussed:  Goals       Increase physical activity      Start Silver Sneakers 3 days weekly.      Lose weight (pt-stated)      My goal is 205lb      Quit smoking / using tobacco      Planned quit date: December 31, 2016      Weight (lb) < 220 lb (99.8 kg) (pt-stated)      Goal weight: 220 lbs by next year        This is a list of the screening recommended for you and due dates:  Health Maintenance  Topic Date Due   Eye exam for diabetics  01/05/2022   DTaP/Tdap/Td vaccine (3 - Td or Tdap) 01/28/2022   COVID-19 Vaccine (4 - 2023-24 season) 06/17/2022*   Zoster (Shingles) Vaccine (1 of 2) 08/31/2022*   Hemoglobin A1C  11/10/2022   Yearly kidney function blood test for diabetes  05/12/2023   Yearly kidney health urinalysis for diabetes  05/12/2023   Complete foot exam   05/12/2023   Medicare Annual Wellness Visit  06/02/2023   Colon Cancer Screening  05/19/2026   Pneumonia Vaccine  Completed   Flu Shot  Completed   Hepatitis C Screening: USPSTF Recommendation to screen - Ages 18-79 yo.  Completed   HPV Vaccine  Aged Out  *Topic was postponed. The date shown is not the original due date.    Advanced directives: Please bring a copy of your health care power of attorney and living will to the office to be added to your chart at your convenience.   Conditions/risks identified: None  Next appointment: Follow up in one year for your annual wellness visit.    Preventive Care 73 Years and Older, Male  Preventive care refers to lifestyle choices and visits with your health care provider that can promote health and wellness. What does preventive care include? A yearly physical exam. This is also called an annual well  check. Dental exams once or twice a year. Routine eye exams. Ask your health care provider how often you should have your eyes checked. Personal lifestyle choices, including: Daily care of your teeth and gums. Regular physical activity. Eating a healthy diet. Avoiding tobacco and drug use. Limiting alcohol use. Practicing safe sex. Taking low doses of aspirin every day. Taking vitamin and mineral supplements as recommended by your health care provider. What happens during an annual well check? The services and screenings done by your health care provider during your annual well check will depend on your age, overall health, lifestyle risk factors, and family history of disease. Counseling  Your health care provider may ask you questions about your: Alcohol use. Tobacco use. Drug use. Emotional well-being. Home and relationship well-being. Sexual activity. Eating habits. History of falls. Memory and ability to understand (cognition). Work and work Statistician. Screening  You may have the following tests or measurements: Height, weight, and BMI. Blood pressure. Lipid and cholesterol levels. These may be checked every 5 years, or more frequently if you are over 3 years old. Skin check. Lung cancer screening. You may have this screening every year starting at age 73 if you have a 30-pack-year  history of smoking and currently smoke or have quit within the past 15 years. Fecal occult blood test (FOBT) of the stool. You may have this test every year starting at age 73. Flexible sigmoidoscopy or colonoscopy. You may have a sigmoidoscopy every 5 years or a colonoscopy every 10 years starting at age 73. Prostate cancer screening. Recommendations will vary depending on your family history and other risks. Hepatitis C blood test. Hepatitis B blood test. Sexually transmitted disease (STD) testing. Diabetes screening. This is done by checking your blood sugar (glucose) after you have not  eaten for a while (fasting). You may have this done every 1-3 years. Abdominal aortic aneurysm (AAA) screening. You may need this if you are a current or former smoker. Osteoporosis. You may be screened starting at age 73 if you are at high risk. Talk with your health care provider about your test results, treatment options, and if necessary, the need for more tests. Vaccines  Your health care provider may recommend certain vaccines, such as: Influenza vaccine. This is recommended every year. Tetanus, diphtheria, and acellular pertussis (Tdap, Td) vaccine. You may need a Td booster every 10 years. Zoster vaccine. You may need this after age 73. Pneumococcal 13-valent conjugate (PCV13) vaccine. One dose is recommended after age 73. Pneumococcal polysaccharide (PPSV23) vaccine. One dose is recommended after age 73. Talk to your health care provider about which screenings and vaccines you need and how often you need them. This information is not intended to replace advice given to you by your health care provider. Make sure you discuss any questions you have with your health care provider. Document Released: 06/04/2015 Document Revised: 01/26/2016 Document Reviewed: 03/09/2015 Elsevier Interactive Patient Education  2017 [place] Prevention in the Home Falls can cause injuries. They can happen to people of all ages. There are many things you can do to make your home safe and to help prevent falls. What can I do on the outside of my home? Regularly fix the edges of walkways and driveways and fix any cracks. Remove anything that might make you trip as you walk through a door, such as a raised step or threshold. Trim any bushes or trees on the path to your home. Use bright outdoor lighting. Clear any walking paths of anything that might make someone trip, such as rocks or tools. Regularly check to see if handrails are loose or broken. Make sure that both sides of any steps have  handrails. Any raised decks and porches should have guardrails on the edges. Have any leaves, snow, or ice cleared regularly. Use sand or salt on walking paths during winter. Clean up any spills in your garage right away. This includes oil or grease spills. What can I do in the bathroom? Use night lights. Install grab bars by the toilet and in the tub and shower. Do not use towel bars as grab bars. Use non-skid mats or decals in the tub or shower. If you need to sit down in the shower, use a plastic, non-slip stool. Keep the floor dry. Clean up any water that spills on the floor as soon as it happens. Remove soap buildup in the tub or shower regularly. Attach bath mats securely with double-sided non-slip rug tape. Do not have throw rugs and other things on the floor that can make you trip. What can I do in the bedroom? Use night lights. Make sure that you have a light by your bed that is easy to reach. Do  not use any sheets or blankets that are too big for your bed. They should not hang down onto the floor. Have a firm chair that has side arms. You can use this for support while you get dressed. Do not have throw rugs and other things on the floor that can make you trip. What can I do in the kitchen? Clean up any spills right away. Avoid walking on wet floors. Keep items that you use a lot in easy-to-reach places. If you need to reach something above you, use a strong step stool that has a grab bar. Keep electrical cords out of the way. Do not use floor polish or wax that makes floors slippery. If you must use wax, use non-skid floor wax. Do not have throw rugs and other things on the floor that can make you trip. What can I do with my stairs? Do not leave any items on the stairs. Make sure that there are handrails on both sides of the stairs and use them. Fix handrails that are broken or loose. Make sure that handrails are as long as the stairways. Check any carpeting to make sure  that it is firmly attached to the stairs. Fix any carpet that is loose or worn. Avoid having throw rugs at the top or bottom of the stairs. If you do have throw rugs, attach them to the floor with carpet tape. Make sure that you have a light switch at the top of the stairs and the bottom of the stairs. If you do not have them, ask someone to add them for you. What else can I do to help prevent falls? Wear shoes that: Do not have high heels. Have rubber bottoms. Are comfortable and fit you well. Are closed at the toe. Do not wear sandals. If you use a stepladder: Make sure that it is fully opened. Do not climb a closed stepladder. Make sure that both sides of the stepladder are locked into place. Ask someone to hold it for you, if possible. Clearly mark and make sure that you can see: Any grab bars or handrails. First and last steps. Where the edge of each step is. Use tools that help you move around (mobility aids) if they are needed. These include: Canes. Walkers. Scooters. Crutches. Turn on the lights when you go into a dark area. Replace any light bulbs as soon as they burn out. Set up your furniture so you have a clear path. Avoid moving your furniture around. If any of your floors are uneven, fix them. If there are any pets around you, be aware of where they are. Review your medicines with your doctor. Some medicines can make you feel dizzy. This can increase your chance of falling. Ask your doctor what other things that you can do to help prevent falls. This information is not intended to replace advice given to you by your health care provider. Make sure you discuss any questions you have with your health care provider. Document Released: 03/04/2009 Document Revised: 10/14/2015 Document Reviewed: 06/12/2014 Elsevier Interactive Patient Education  2017 [employer].

## 2022-06-01 NOTE — Progress Notes (Signed)
Subjective:   Jonathan Calhoun is a 72 y.o. male who presents for Medicare Annual/Subsequent preventive examination.  Review of Systems    Virtual Visit via Telephone Note  I connected with  HAREL REPETTO on 06/01/22 at 10:45 AM EST by telephone and verified that I am speaking with the correct person using two identifiers.  Location: Patient: Home Provider: Office Persons participating in the virtual visit: patient/Nurse Health Advisor   I discussed the limitations, risks, security and privacy concerns of performing an evaluation and management service by telephone and the availability of in person appointments. The patient expressed understanding and agreed to proceed.  Interactive audio and video telecommunications were attempted between this nurse and patient, however failed, due to patient having technical difficulties OR patient did not have access to video capability.  We continued and completed visit with audio only.  Some vital signs may be absent or patient reported.   [name], LPN  Cardiac Risk Factors include: advanced age (>9mn, >>18women);hypertension;male gender;diabetes mellitus     Objective:    Today's Vitals   06/01/22 1056  Weight: 218 lb (98.9 kg)  Height: 6' (1.829 m)   Body mass index is 29.57 kg/m.     06/01/2022   11:04 AM 05/31/2021   11:30 AM 05/04/2020   10:41 AM 07/02/2018    1:03 PM 04/03/2018    6:16 AM 04/02/2018    1:26 PM 11/15/2016    9:13 AM  Advanced Directives  Does Patient Have a Medical Advance Directive? Yes Yes No Yes Yes Yes Yes  Type of AParamedicof [place]Living will HCorpus ChristiLiving will  [place]Living will [place]Grand J[place]Living will Healthcare Power of [place]cad[place]Living will  Does patient want to make changes to medical advance directive?  No - Patient declined  No - Patient declined No - Patient declined No  - Patient declined   Copy of [place]in Chart? No - copy requested No - copy requested   No - copy requested No - copy requested No - copy requested  Would patient like information on creating a medical advance directive?   No - Patient declined  No - Patient declined No - Patient declined     Current Medications (verified) Outpatient Encounter Medications as of 06/01/2022  Medication Sig   atorvastatin (LIPITOR) 20 MG tablet TAKE ONE TABLET BY MOUTH EVERY MORNING   Blood Glucose Monitoring Suppl (ONETOUCH VERIO) w/Device KIT Use to test three times daily.   Cholecalciferol (VITAMIN D3) 50 MCG (2000 UT) TABS Take 2,000 Units by mouth daily.   citalopram (CELEXA) 10 MG tablet Take 1 tablet (10 mg total) by mouth daily.   Dulaglutide (TRULICITY) 09.02MIO/9.7DZSOPN INJECT 0.'75mg'$  into THE SKIN ONCE A WEEK   glucose blood (ONETOUCH VERIO) test strip Use to test three times daily.   Lancets (ONETOUCH ULTRASOFT) lancets Use to test three times daily.   lisinopril (ZESTRIL) 5 MG tablet TAKE ONE TABLET BY MOUTH EVERY MORNING   predniSONE (DELTASONE) 20 MG tablet Take 1 tablet (20 mg total) by mouth daily with breakfast.   No facility-administered encounter medications on file as of 06/01/2022.    Allergies (verified) Patient has no known allergies.   History: Past Medical History:  Diagnosis Date   Allergy    Anxiety    Depression    Diabetes mellitus    ORAL MEDS-NO INSULIN   History  of alcoholism ([place])    History of shingles    Hyperlipidemia    Hypertension    Ileus, postoperative [facility])    Post-op Chole Surgery   Past Surgical History:  Procedure Laterality Date   BONE EXOSTOSIS EXCISION  08/10/2011   Procedure: EXOSTOSIS EXCISION;  Surgeon: Marcheta Grammes, DPM;  Location: [facility];  Service: Orthopedics;  Laterality: Left;  Exostectomy of Calcaneous Left Foot   CARDIAC CATHETERIZATION  2002   CHOLECYSTECTOMY     COLON SURGERY     COLONOSCOPY   12/21/2015   Dr.Danis   ENDOVENOUS ABLATION SAPHENOUS VEIN W/ LASER Left 01/01/2019   endovenous laser ablation left greater saphenous vein by Ruta Hinds MD    OSTECTOMY Right 04/03/2018   Procedure: OSTECTOMY RIGHT CALCANEUS;  Surgeon: Caprice Beaver, DPM;  Location: [facility];  Service: Podiatry;  Laterality: Right;   RIGHT SHOULDER SURGERY FOR TEAR AND SPUR - YRS AGO     SYNOVECTOMY  08/10/2011   Procedure: SYNOVECTOMY;  Surgeon: Marcheta Grammes, DPM;  Location: [facility];  Service: Orthopedics;  Laterality: Left;  Synovectomy of Peroneal Tendon Left Foot   TENDON REPAIR  08/10/2011   Procedure: TENDON REPAIR;  Surgeon: Marcheta Grammes, DPM;  Location: [facility];  Service: Orthopedics;  Laterality: Left;  Repair of Peroneal Tendon Left Foot   TENDON REPAIR Right 04/03/2018   Procedure: EXPLORATION OF PERONEAL TENDON WITH POSSIBLE REPAIR OF LONGITUDINAL TEAR OF THE PERONEAL TENDON RIGHT FOOT;  Surgeon: Caprice Beaver, DPM;  Location: [facility];  Service: Podiatry;  Laterality: Right;   TONSILLECTOMY     TOTAL HIP ARTHROPLASTY  03/29/2011   Procedure: TOTAL HIP ARTHROPLASTY;  Surgeon: Dione Plover Aluisio;  Location: [facility];  Service: Orthopedics;  Laterality: Right;   Family History  Problem Relation Age of Onset   Depression Mother    Cancer Mother        colon   Heart failure Mother    Colon cancer Mother 28   Coronary artery disease Father    Hypertension Father    Social History   Socioeconomic History   Marital status: Married    Spouse name: Not on file   Number of children: Not on file   Years of education: Not on file   Highest education level: Bachelor's degree (e.g., BA, AB, BS)  Occupational History   Not on file  Tobacco Use   Smoking status: Every Day    Packs/day: 0.00    Types: Cigars, Cigarettes   Smokeless tobacco: Never  Vaping Use   Vaping Use: Never used  Substance and Sexual Activity   Alcohol use: No    Comment: quit 12 yrs ago   Drug  use: No   Sexual activity: Not on file  Other Topics Concern   Not on file  Social History Narrative   Married, lives with spouse.   Plans for home after surgery.   Living will, healthcare POA.   Social Determinants of Health   Financial Resource Strain: Low Risk  (06/01/2022)   Overall Financial Resource Strain (CARDIA)    Difficulty of Paying Living Expenses: Not hard at all  Food Insecurity: No Food Insecurity (06/01/2022)   Hunger Vital Sign    Worried About Running Out of Food in the Last Year: Never true    Ran Out of Food in the Last Year: Never true  Transportation Needs: No Transportation Needs (06/01/2022)   PRAPARE - Hydrologist (Medical): No  Lack of Transportation (Non-Medical): No  Physical Activity: Sufficiently Active (06/01/2022)   Exercise Vital Sign    Days of Exercise per Week: 3 days    Minutes of Exercise per Session: 60 min  Stress: No Stress Concern Present (06/01/2022)   [place]    Feeling of Stress : Not at all  Social Connections: [place] (06/01/2022)   Social Connection and Isolation Panel [NHANES]    Frequency of Communication with Friends and Family: More than three times a week    Frequency of Social Gatherings with Friends and Family: More than three times a week    Attends Religious Services: More than 4 times per year    Active Member of [employer] or Organizations: Yes    Attends Music therapist: More than 4 times per year    Marital Status: Married    Tobacco Counseling Ready to quit: No Counseling given: Yes   Clinical Intake:  Pre-visit preparation completed: No  Pain : No/denies painNutrition Risk Assessment:  Has the patient had any N/V/D within the last 2 months?  No  Does the patient have any non-healing wounds?  No  Has the patient had any unintentional weight loss or weight gain?  No   Diabetes:  Is the  patient diabetic?  Yes  If diabetic, was a CBG obtained today?  No  Did the patient bring in their glucometer from home?  No  How often do you monitor your CBG's? PRN.   Financial Strains and Diabetes Management:  Are you having any financial strains with the device, your supplies or your medication? No .  Does the patient want to be seen by Chronic Care Management for management of their diabetes?  No  Would the patient like to be referred to a Nutritionist or for Diabetic Management?  No   Diabetic Exams:  Diabetic Eye Exam: Completed No. Overdue for diabetic eye exam. Pt has been advised about the importance in completing this exam. A referral has been placed today. Message sent to referral coordinator for scheduling purposes. Advised pt to expect a call from office referred to regarding appt.  Diabetic Foot Exam: Completed No. Pt has been advised about the importance in completing this exam. Pt is scheduled for diabetic foot exam on Followed by PCP.       BMI - recorded: 29.57 Nutritional Status: BMI 25 -29 Overweight Nutritional Risks: None Diabetes: Yes CBG done?: No Did pt. bring in CBG monitor from home?: No  How often do you need to have someone help you when you read instructions, pamphlets, or other written materials from your doctor or pharmacy?: 1 - Never  Diabetic?  Yes  Interpreter Needed?: No  Information entered by :: Rolene Arbour LPN   Activities of Daily Living    06/01/2022   11:02 AM  In your present state of health, do you have any difficulty performing the following activities:  Hearing? 0  Vision? 0  Difficulty concentrating or making decisions? 0  Walking or climbing stairs? 0  Dressing or bathing? 0  Doing errands, shopping? 0  Preparing Food and eating ? N  Using the Toilet? N  In the past six months, have you accidently leaked urine? N  Do you have problems with loss of bowel control? N  Managing your Medications? N  Managing your  Finances? N  Housekeeping or managing your Housekeeping? N    Patient Care Team: [name], NP as  PCP - General (Family Medicine) Ortho, [facility] (Orthopedic Surgery) Marlyce Huge, DDS as Consulting Physician (Dentistry) Viona Gilmore, [facility] as Pharmacist (Pharmacist)  Indicate any recent Medical Services you may have received from other than [facility] providers in the past year (date may be approximate).     Assessment:   This is a routine wellness examination for [place].  Hearing/Vision screen Hearing Screening - Comments:: Denies hearing difficulties   Vision Screening - Comments:: Wears reading glasses - up to date with routine eye exams with  Dr Katy Fitch  Dietary issues and exercise activities discussed: Current Exercise Habits: Home exercise routine, Type of exercise: walking, Time (Minutes): 60, Frequency (Times/Week): 3, Weekly Exercise (Minutes/Week): 180, Intensity: Moderate, Exercise limited by: None identified   Goals Addressed               This Visit's Progress     Lose weight (pt-stated)        My goal is 205lb       Depression Screen    06/01/2022   11:01 AM 12/14/2021   10:05 AM 05/31/2021   11:20 AM 05/04/2020   10:43 AM 03/17/2020    7:43 AM 03/11/2019    7:27 AM 11/15/2016    9:04 AM  PHQ 2/9 Scores  PHQ - 2 Score 0 1 0 0 1 0 0  PHQ- 9 Score  3  0 1      Fall Risk    06/01/2022   11:02 AM 11/25/2021    7:57 AM 05/31/2021   11:26 AM 03/22/2021    9:27 AM 05/04/2020   10:42 AM  Fall Risk   Falls in the past year? 0 0 0 0 0  Number falls in past yr: 0  0 0 0  Injury with Fall? 0  0 0 0  Risk for fall due to : No Fall Risks   No Fall Risks History of fall(s)  Follow up Falls prevention discussed    Falls evaluation completed;Falls prevention discussed    FALL RISK PREVENTION PERTAINING TO THE HOME:  Any stairs in or around the home? Yes  If so, are there any without handrails? No  Home free of loose throw rugs in walkways, pet beds, electrical  cords, etc? Yes  Adequate lighting in your home to reduce risk of falls? Yes   ASSISTIVE DEVICES UTILIZED TO PREVENT FALLS:  Life alert? No  Use of a cane, walker or w/c? No  Grab bars in the bathroom? No  Shower chair or bench in shower? No  Elevated toilet seat or a handicapped toilet? Yes   TIMED UP AND GO:  Was the test performed? No . Audio Visit  Cognitive Function:        06/01/2022   11:04 AM 05/31/2021   11:28 AM  6CIT Screen  What Year? 0 points 0 points  What month? 0 points 0 points  What time? 0 points 0 points  Count back from 20 0 points 0 points  Months in reverse 0 points 0 points  Repeat phrase 0 points 0 points  Total Score 0 points 0 points    Immunizations Immunization History  Administered Date(s) Administered   Fluad Quad(high Dose 65+) 02/27/2019, 03/17/2020, 03/22/2021, 05/11/2022   Influenza Split 02/14/2011, 01/29/2012   Influenza Whole 05/22/2006   Influenza, High Dose Seasonal PF 05/30/2018   Influenza,inj,Quad PF,6+ Mos 02/24/2013, 02/12/2014   Moderna SARS-COV2 Booster Vaccination 01/18/2021   PFIZER(Purple Top)SARS-COV-2 Vaccination 07/06/2019, 07/29/2019   Pneumococcal Conjugate-13 02/12/2014  Pneumococcal Polysaccharide-23 05/22/2000, 05/03/2007, 10/24/2016   Td 05/22/1997   Tdap 01/29/2012    TDAP status: Due, Education has been provided regarding the importance of this vaccine. Advised may receive this vaccine at local pharmacy or Health Dept. Aware to provide a copy of the vaccination record if obtained from local pharmacy or Health Dept. Verbalized acceptance and understanding.  Flu Vaccine status: Up to date  Pneumococcal vaccine status: Up to date  Covid-19 vaccine status: Completed vaccines  Qualifies for Shingles Vaccine? Yes   Zostavax completed No   Shingrix Completed?: No.    Education has been provided regarding the importance of this vaccine. Patient has been advised to call insurance company to determine out of  pocket expense if they have not yet received this vaccine. Advised may also receive vaccine at local pharmacy or Health Dept. Verbalized acceptance and understanding.  Screening Tests Health Maintenance  Topic Date Due   OPHTHALMOLOGY EXAM  01/05/2022   DTaP/Tdap/Td (3 - Td or Tdap) 01/28/2022   COVID-19 Vaccine (4 - 2023-24 season) 06/17/2022 (Originally 01/20/2022)   Zoster Vaccines- Shingrix (1 of 2) 08/31/2022 (Originally 11/09/1999)   HEMOGLOBIN A1C  11/10/2022   Diabetic kidney evaluation - eGFR measurement  05/12/2023   Diabetic kidney evaluation - Urine ACR  05/12/2023   FOOT EXAM  05/12/2023   Medicare Annual Wellness (AWV)  06/02/2023   COLONOSCOPY (Pts 45-67yr Insurance coverage will need to be confirmed)  05/19/2026   Pneumonia Vaccine 72 Years old  Completed   INFLUENZA VACCINE  Completed   Hepatitis C Screening  Completed   HPV VACCINES  Aged Out    Health Maintenance  Health Maintenance Due  Topic Date Due   OPHTHALMOLOGY EXAM  01/05/2022   DTaP/Tdap/Td (3 - Td or Tdap) 01/28/2022    Colorectal cancer screening: Type of screening: Colonoscopy. Completed 05/19/21. Repeat every 5 years  Lung Cancer Screening: (Low Dose CT Chest recommended if Age 73-80years, 30 pack-year currently smoking OR have quit w/in 15years.) does qualify.   Lung Cancer Screening Referral: Patient deferred  Additional Screening:  Hepatitis C Screening: does qualify; Completed 10/09/17  Vision Screening: Recommended annual ophthalmology exams for early detection of glaucoma and other disorders of the eye. Is the patient up to date with their annual eye exam?  Yes  Who is the provider or what is the name of the office in which the patient attends annual eye exams? Dr GKaty FitchIf pt is not established with a provider, would they like to be referred to a provider to establish care? No .   Dental Screening: Recommended annual dental exams for proper oral hygiene  Community Resource Referral /  Chronic Care Management:  CRR required this visit?  No   CCM required this visit?  No      Plan:     I have personally reviewed and noted the following in the patient's chart:   Medical and social history Use of alcohol, tobacco or illicit drugs  Current medications and supplements including opioid prescriptions. Patient is not currently taking opioid prescriptions. Functional ability and status Nutritional status Physical activity Advanced directives List of other physicians Hospitalizations, surgeries, and ER visits in previous 12 months Vitals Screenings to include cognitive, depression, and falls Referrals and appointments  In addition, I have reviewed and discussed with patient certain preventive protocols, quality metrics, and best practice recommendations. A written personalized care plan for preventive services as well as general preventive health recommendations were provided to patient.  [name], LPN   05/24/1115   Nurse Notes: None

## 2022-06-08 ENCOUNTER — Other Ambulatory Visit: Payer: Self-pay | Admitting: Adult Health

## 2022-06-08 DIAGNOSIS — F419 Anxiety disorder, unspecified: Secondary | ICD-10-CM

## 2022-06-23 ENCOUNTER — Telehealth: Payer: PPO

## 2022-09-03 ENCOUNTER — Other Ambulatory Visit: Payer: Self-pay | Admitting: Adult Health

## 2022-09-03 DIAGNOSIS — E1169 Type 2 diabetes mellitus with other specified complication: Secondary | ICD-10-CM

## 2022-12-13 ENCOUNTER — Other Ambulatory Visit: Payer: Self-pay | Admitting: Adult Health

## 2022-12-13 DIAGNOSIS — F419 Anxiety disorder, unspecified: Secondary | ICD-10-CM

## 2023-01-25 ENCOUNTER — Encounter: Payer: Self-pay | Admitting: Adult Health

## 2023-01-25 DIAGNOSIS — Z961 Presence of intraocular lens: Secondary | ICD-10-CM | POA: Diagnosis not present

## 2023-01-25 DIAGNOSIS — E119 Type 2 diabetes mellitus without complications: Secondary | ICD-10-CM | POA: Diagnosis not present

## 2023-01-25 LAB — HM DIABETES EYE EXAM

## 2023-03-18 ENCOUNTER — Other Ambulatory Visit: Payer: Self-pay | Admitting: Adult Health

## 2023-04-15 ENCOUNTER — Other Ambulatory Visit: Payer: Self-pay | Admitting: Adult Health

## 2023-04-15 DIAGNOSIS — F419 Anxiety disorder, unspecified: Secondary | ICD-10-CM

## 2023-05-16 ENCOUNTER — Other Ambulatory Visit: Payer: Self-pay | Admitting: Adult Health

## 2023-05-16 DIAGNOSIS — I10 Essential (primary) hypertension: Secondary | ICD-10-CM

## 2023-05-16 DIAGNOSIS — Z760 Encounter for issue of repeat prescription: Secondary | ICD-10-CM

## 2023-05-17 NOTE — Telephone Encounter (Signed)
Patient need to schedule cpe  for more refills.

## 2023-05-19 ENCOUNTER — Encounter: Payer: Self-pay | Admitting: Adult Health

## 2023-05-19 DIAGNOSIS — Z760 Encounter for issue of repeat prescription: Secondary | ICD-10-CM

## 2023-05-19 DIAGNOSIS — I10 Essential (primary) hypertension: Secondary | ICD-10-CM

## 2023-05-22 ENCOUNTER — Encounter: Payer: PPO | Admitting: Adult Health

## 2023-05-22 MED ORDER — ATORVASTATIN CALCIUM 20 MG PO TABS: 20.0000 mg | ORAL_TABLET | Freq: Every morning | ORAL | 0 refills | Status: DC

## 2023-05-22 MED ORDER — LISINOPRIL 5 MG PO TABS
5.0000 mg | ORAL_TABLET | Freq: Every morning | ORAL | 0 refills | Status: DC
Start: 2023-05-22 — End: 2023-06-14

## 2023-06-07 ENCOUNTER — Other Ambulatory Visit: Payer: Self-pay | Admitting: Adult Health

## 2023-06-07 DIAGNOSIS — E1169 Type 2 diabetes mellitus with other specified complication: Secondary | ICD-10-CM

## 2023-06-11 ENCOUNTER — Telehealth: Payer: Self-pay

## 2023-06-11 NOTE — Telephone Encounter (Signed)
Unsuccessful attempts to reach patient on preferred number listed in notes for scheduled AWV. Left message on voicemail okay to reschedule.

## 2023-06-14 ENCOUNTER — Other Ambulatory Visit: Payer: Self-pay | Admitting: Adult Health

## 2023-06-14 DIAGNOSIS — Z760 Encounter for issue of repeat prescription: Secondary | ICD-10-CM

## 2023-06-14 DIAGNOSIS — I10 Essential (primary) hypertension: Secondary | ICD-10-CM

## 2023-06-18 ENCOUNTER — Other Ambulatory Visit: Payer: Self-pay | Admitting: Adult Health

## 2023-07-04 ENCOUNTER — Other Ambulatory Visit (HOSPITAL_COMMUNITY): Payer: Self-pay

## 2023-07-04 ENCOUNTER — Ambulatory Visit (INDEPENDENT_AMBULATORY_CARE_PROVIDER_SITE_OTHER): Payer: PPO | Admitting: Adult Health

## 2023-07-04 ENCOUNTER — Encounter: Payer: Self-pay | Admitting: Adult Health

## 2023-07-04 ENCOUNTER — Telehealth: Payer: Self-pay | Admitting: Pharmacist

## 2023-07-04 VITALS — BP 110/70 | HR 63 | Temp 98.5°F | Ht 72.0 in | Wt 234.0 lb

## 2023-07-04 DIAGNOSIS — E1169 Type 2 diabetes mellitus with other specified complication: Secondary | ICD-10-CM | POA: Diagnosis not present

## 2023-07-04 DIAGNOSIS — I10 Essential (primary) hypertension: Secondary | ICD-10-CM

## 2023-07-04 DIAGNOSIS — Z0000 Encounter for general adult medical examination without abnormal findings: Secondary | ICD-10-CM

## 2023-07-04 DIAGNOSIS — Z7985 Long-term (current) use of injectable non-insulin antidiabetic drugs: Secondary | ICD-10-CM | POA: Diagnosis not present

## 2023-07-04 DIAGNOSIS — R351 Nocturia: Secondary | ICD-10-CM

## 2023-07-04 DIAGNOSIS — N401 Enlarged prostate with lower urinary tract symptoms: Secondary | ICD-10-CM | POA: Diagnosis not present

## 2023-07-04 DIAGNOSIS — E782 Mixed hyperlipidemia: Secondary | ICD-10-CM

## 2023-07-04 DIAGNOSIS — E559 Vitamin D deficiency, unspecified: Secondary | ICD-10-CM | POA: Diagnosis not present

## 2023-07-04 LAB — COMPREHENSIVE METABOLIC PANEL
ALT: 23 U/L (ref 0–53)
AST: 20 U/L (ref 0–37)
Albumin: 4.3 g/dL (ref 3.5–5.2)
Alkaline Phosphatase: 97 U/L (ref 39–117)
BUN: 16 mg/dL (ref 6–23)
CO2: 28 meq/L (ref 19–32)
Calcium: 9.1 mg/dL (ref 8.4–10.5)
Chloride: 101 meq/L (ref 96–112)
Creatinine, Ser: 0.85 mg/dL (ref 0.40–1.50)
GFR: 86.12 mL/min (ref >60.00)
Glucose, Bld: 125 mg/dL — ABNORMAL HIGH (ref 70–99)
Potassium: 4.2 meq/L (ref 3.5–5.1)
Sodium: 137 meq/L (ref 135–145)
Total Bilirubin: 1.1 mg/dL (ref 0.2–1.2)
Total Protein: 6.9 g/dL (ref 6.0–8.3)

## 2023-07-04 LAB — LIPID PANEL
Cholesterol: 136 mg/dL (ref 0–200)
HDL: 39.9 mg/dL (ref >39.00)
LDL Cholesterol: 72 mg/dL (ref 0–99)
NonHDL: 95.69
Total CHOL/HDL Ratio: 3
Triglycerides: 119 mg/dL (ref 0.0–149.0)
VLDL: 23.8 mg/dL (ref 0.0–40.0)

## 2023-07-04 LAB — CBC
HCT: 42.4 % (ref 39.0–52.0)
Hemoglobin: 14.3 g/dL (ref 13.0–17.0)
MCHC: 33.8 g/dL (ref 30.0–36.0)
MCV: 93.5 fL (ref 78.0–100.0)
Platelets: 199 10*3/uL (ref 150.0–400.0)
RBC: 4.53 Mil/uL (ref 4.22–5.81)
RDW: 14.4 % (ref 11.5–15.5)
WBC: 7.2 10*3/uL (ref 4.0–10.5)

## 2023-07-04 LAB — VITAMIN D 25 HYDROXY (VIT D DEFICIENCY, FRACTURES): VITD: 49.23 ng/mL (ref 30.00–100.00)

## 2023-07-04 LAB — HEMOGLOBIN A1C: Hgb A1c MFr Bld: 6.6 % — ABNORMAL HIGH (ref 4.6–6.5)

## 2023-07-04 LAB — TSH: TSH: 3.03 u[IU]/mL (ref 0.35–5.50)

## 2023-07-04 LAB — PSA: PSA: 0.34 ng/mL (ref 0.10–4.00)

## 2023-07-04 MED ORDER — TIRZEPATIDE 2.5 MG/0.5ML ~~LOC~~ SOAJ: 2.5000 mg | SUBCUTANEOUS | 0 refills | Status: DC

## 2023-07-04 MED ORDER — LANCETS MISC. MISC: 1.0000 | Freq: Three times a day (TID) | 1 refills | Status: AC

## 2023-07-04 MED ORDER — BLOOD GLUCOSE MONITORING SUPPL DEVI: 1.0000 | Freq: Three times a day (TID) | 0 refills | Status: AC

## 2023-07-04 MED ORDER — BLOOD GLUCOSE TEST VI STRP: 1.0000 | ORAL_STRIP | Freq: Three times a day (TID) | 3 refills | Status: AC

## 2023-07-04 NOTE — Telephone Encounter (Signed)
Pharmacy Patient Advocate Encounter  Received notification from [facility] [employer] that Prior Authorization for [facility] 2.5MG /0.5ML auto-injectors has been APPROVED from 07/04/2023 to 07/03/2024   PA #/Case ID/Reference #:  [id number]

## 2023-07-04 NOTE — Progress Notes (Signed)
Subjective:    Patient ID: Jonathan Calhoun, male    DOB: Mar 22, 1951, 73 y.o.   MRN: [id number]  HPI Patient presents for yearly preventative medicine examination. He is a pleasant 73 year old male who  has a past medical history of Allergy, Anxiety, Depression, Diabetes mellitus, History of alcoholism (HCC), History of shingles, Hyperlipidemia, Hypertension, and Ileus, postoperative (HCC).  DM - maintained on Trulicity 0.75 mg weekly.  He denies hypoglycemic events and that has been monitoring his blood sugars at home with readings consistently in the low 100s. Lab Results  Component Value Date   HGBA1C 6.2 05/11/2022   HGBA1C 5.9 12/14/2021   HGBA1C 6.1 03/22/2021   HTN -continue lisinopril 5 mg daily.  He denies dizziness, lightheadedness, chest pain, or shortness of breath BP Readings from Last 3 Encounters:  07/04/23 110/70  05/11/22 120/80  12/14/21 130/86   Hyperlipidemia -managed with Lipitor 20 mg daily.  He denies myalgia or fatigue Lab Results  Component Value Date   CHOL 128 05/11/2022   HDL 43.00 05/11/2022   LDLCALC 67 05/11/2022   TRIG 87.0 05/11/2022   CHOLHDL 3 05/11/2022    BPH - asymptomatic   Anxiety and Depression - managed with Celexa 10 mg daily.   All immunizations and health maintenance protocols were reviewed with the patient and needed orders were placed.  Appropriate screening laboratory values were ordered for the patient including screening of hyperlipidemia, renal function and hepatic function. If indicated by BPH, a PSA was ordered.  Medication reconciliation,  past medical history, social history, problem list and allergies were reviewed in detail with the patient  Goals were established with regard to weight loss, exercise, and  diet in compliance with medications. He just started working out at [employer].  Wt Readings from Last 3 Encounters:  07/04/23 234 lb (106.1 kg)  06/01/22 218 lb (98.9 kg)  05/11/22 218 lb (98.9 kg)   He is up  to date on routine colon cancer screening    Review of Systems  Constitutional: Negative.   HENT: Negative.    Eyes: Negative.   Respiratory: Negative.    Cardiovascular: Negative.   Gastrointestinal: Negative.   Endocrine: Negative.   Genitourinary: Negative.   Musculoskeletal: Negative.   Skin: Negative.   Allergic/Immunologic: Negative.   Neurological: Negative.   Hematological: Negative.   Psychiatric/Behavioral: Negative.    All other systems reviewed and are negative.  Past Medical History:  Diagnosis Date   Allergy    Anxiety    Depression    Diabetes mellitus    ORAL MEDS-NO INSULIN   History of alcoholism (HCC)    History of shingles    Hyperlipidemia    Hypertension    Ileus, postoperative (HCC)    Post-op Chole Surgery    Social History   Socioeconomic History   Marital status: Married    Spouse name: Not on file   Number of children: Not on file   Years of education: Not on file   Highest education level: Bachelor's degree (e.g., BA, AB, BS)  Occupational History   Not on file  Tobacco Use   Smoking status: Every Day    Types: Cigars, Cigarettes   Smokeless tobacco: Never  Vaping Use   Vaping status: Never Used  Substance and Sexual Activity   Alcohol use: No    Comment: quit 12 yrs ago   Drug use: No   Sexual activity: Not on file  Other Topics Concern   Not on  file  Social History Narrative   Married, lives with spouse.   Plans for home after surgery.   Living will, healthcare POA.   Social Drivers of Corporate investment banker Strain: Low Risk  (06/30/2023)   Overall Financial Resource Strain (CARDIA)    Difficulty of Paying Living Expenses: Not hard at all  Food Insecurity: No Food Insecurity (06/30/2023)   Hunger Vital Sign    Worried About Running Out of Food in the Last Year: Never true    Ran Out of Food in the Last Year: Never true  Transportation Needs: No Transportation Needs (06/30/2023)   PRAPARE - Scientist, research (physical sciences) (Medical): No    Lack of Transportation (Non-Medical): No  Physical Activity: Sufficiently Active (06/30/2023)   Exercise Vital Sign    Days of Exercise per Week: 3 days    Minutes of Exercise per Session: 60 min  Stress: No Stress Concern Present (06/30/2023)   [employer] of Occupational Health - Occupational Stress Questionnaire    Feeling of Stress : Not at all  Social Connections: Unknown (06/30/2023)   Social Connection and Isolation Panel [NHANES]    Frequency of Communication with Friends and Family: More than three times a week    Frequency of Social Gatherings with Friends and Family: Twice a week    Attends Religious Services: Patient declined    Active Member of Clubs or Organizations: Yes    Attends Banker Meetings: More than 4 times per year    Marital Status: Married  Catering manager Violence: Not At Risk (06/01/2022)   Humiliation, Afraid, Rape, and Kick questionnaire    Fear of Current or Ex-Partner: No    Emotionally Abused: No    Physically Abused: No    Sexually Abused: No    Past Surgical History:  Procedure Laterality Date   BONE EXOSTOSIS EXCISION  08/10/2011   Procedure: EXOSTOSIS EXCISION;  Surgeon: Dallas Schimke, DPM;  Location: [facility];  Service: Orthopedics;  Laterality: Left;  Exostectomy of Calcaneous Left Foot   CARDIAC CATHETERIZATION  2002   CHOLECYSTECTOMY     COLON SURGERY     COLONOSCOPY  12/21/2015   Dr.Danis   ENDOVENOUS ABLATION SAPHENOUS VEIN W/ LASER Left 01/01/2019   endovenous laser ablation left greater saphenous vein by Fabienne Bruns MD    OSTECTOMY Right 04/03/2018   Procedure: OSTECTOMY RIGHT CALCANEUS;  Surgeon: Ferman Hamming, DPM;  Location: [facility];  Service: Podiatry;  Laterality: Right;   RIGHT SHOULDER SURGERY FOR TEAR AND SPUR - YRS AGO     SYNOVECTOMY  08/10/2011   Procedure: SYNOVECTOMY;  Surgeon: Dallas Schimke, DPM;  Location: [facility];  Service: Orthopedics;  Laterality:  Left;  Synovectomy of Peroneal Tendon Left Foot   TENDON REPAIR  08/10/2011   Procedure: TENDON REPAIR;  Surgeon: Dallas Schimke, DPM;  Location: [facility];  Service: Orthopedics;  Laterality: Left;  Repair of Peroneal Tendon Left Foot   TENDON REPAIR Right 04/03/2018   Procedure: EXPLORATION OF PERONEAL TENDON WITH POSSIBLE REPAIR OF LONGITUDINAL TEAR OF THE PERONEAL TENDON RIGHT FOOT;  Surgeon: Ferman Hamming, DPM;  Location: [facility];  Service: Podiatry;  Laterality: Right;   TONSILLECTOMY     TOTAL HIP ARTHROPLASTY  03/29/2011   Procedure: TOTAL HIP ARTHROPLASTY;  Surgeon: Gus Rankin Aluisio;  Location: [facility];  Service: Orthopedics;  Laterality: Right;    Family History  Problem Relation Age of Onset   Depression Mother  Cancer Mother        colon   Heart failure Mother    Colon cancer Mother 60   Coronary artery disease Father    Hypertension Father     No Known Allergies  Current Outpatient Medications on File Prior to Visit  Medication Sig Dispense Refill   atorvastatin (LIPITOR) 20 MG tablet TAKE 1 TABLET BY MOUTH EVERY DAY IN THE MORNING 90 tablet 1   Blood Glucose Monitoring Suppl (ONETOUCH VERIO) w/Device KIT Use to test three times daily. 1 kit 0   Cholecalciferol (VITAMIN D3) 50 MCG (2000 UT) capsule TAKE 1 CAPSULE BY MOUTH EVERY DAY IN THE MORNING 30 capsule 2   citalopram (CELEXA) 10 MG tablet TAKE 1 TABLET BY MOUTH EVERY DAY IN THE MORNING 90 tablet 1   Dulaglutide (TRULICITY) 0.75 MG/0.5ML SOAJ INJECT 0.75MG  INTO THE SKIN WEEKLY 2 mL 0   glucose blood (ONETOUCH VERIO) test strip Use to test three times daily. 300 each 3   Lancets (ONETOUCH ULTRASOFT) lancets Use to test three times daily. 300 each 3   lisinopril (ZESTRIL) 5 MG tablet TAKE 1 TABLET BY MOUTH EVERY DAY IN THE MORNING 90 tablet 1   No current facility-administered medications on file prior to visit.    BP 110/70   Pulse 63   Temp 98.5 F (36.9 C) (Oral)   Ht 6' (1.829 m)   Wt 234 lb  (106.1 kg)   SpO2 95%   BMI 31.74 kg/m       Objective:   Physical Exam Vitals and nursing note reviewed.  Constitutional:      General: He is not in acute distress.    Appearance: Normal appearance. He is obese. He is not ill-appearing.  HENT:     Head: Normocephalic and atraumatic.     Right Ear: Tympanic membrane, ear canal and external ear normal. There is no impacted cerumen.     Left Ear: Tympanic membrane, ear canal and external ear normal. There is no impacted cerumen.     Nose: Nose normal. No congestion or rhinorrhea.     Mouth/Throat:     Mouth: Mucous membranes are moist.     Pharynx: Oropharynx is clear.  Eyes:     Extraocular Movements: Extraocular movements intact.     Conjunctiva/sclera: Conjunctivae normal.     Pupils: Pupils are equal, round, and reactive to light.  Neck:     Vascular: No carotid bruit.  Cardiovascular:     Rate and Rhythm: Normal rate and regular rhythm.     Pulses: Normal pulses.     Heart sounds: No murmur heard.    No friction rub. No gallop.  Pulmonary:     Effort: Pulmonary effort is normal.     Breath sounds: Normal breath sounds.  Abdominal:     General: Abdomen is flat. Bowel sounds are normal. There is no distension.     Palpations: Abdomen is soft. There is no mass.     Tenderness: There is no abdominal tenderness. There is no guarding or rebound.     Hernia: No hernia is present.  Musculoskeletal:        General: Normal range of motion.     Cervical back: Normal range of motion and neck supple.  Lymphadenopathy:     Cervical: No cervical adenopathy.  Skin:    General: Skin is warm and dry.     Capillary Refill: Capillary refill takes less than 2 seconds.  Neurological:  General: No focal deficit present.     Mental Status: He is alert and oriented to person, place, and time.  Psychiatric:        Mood and Affect: Mood normal.        Behavior: Behavior normal.        Thought Content: Thought content normal.         Judgment: Judgment normal.        Assessment & Plan:  1. Routine general medical examination at a health care facility (Primary) Today patient counseled on age appropriate routine health concerns for screening and prevention, each reviewed and up to date or declined. Immunizations reviewed and up to date or declined. Labs ordered and reviewed. Risk factors for depression reviewed and negative. Hearing function and visual acuity are intact. ADLs screened and addressed as needed. Functional ability and level of safety reviewed and appropriate. Education, counseling and referrals performed based on assessed risks today. Patient provided with a copy of personalized plan for preventive services. - Advised tdap and shingles vaccination at pharmacy  - Follow up in one year or sooner if needed   2. Type 2 diabetes mellitus with other specified complication, without long-term current use of insulin (HCC) - Will see if Billie Lade is covered by insurance.  - Lipid panel; Future - TSH; Future - CBC; Future - Comprehensive metabolic panel; Future - Hemoglobin A1c; Future - Microalbumin/Creatinine Ratio, Urine; Future - tirzepatide [facility]) 2.5 MG/0.5ML Pen; Inject 2.5 mg into the skin once a week.  Dispense: 2 mL; Refill: 0  3. Long-term current use of injectable noninsulin antidiabetic medication  - Lipid panel; Future - TSH; Future - CBC; Future - Comprehensive metabolic panel; Future - Hemoglobin A1c; Future - Microalbumin/Creatinine Ratio, Urine; Future - tirzepatide [facility]) 2.5 MG/0.5ML Pen; Inject 2.5 mg into the skin once a week.  Dispense: 2 mL; Refill: 0  4. Essential hypertension - Well controlled. No change in medication  - Lipid panel; Future - TSH; Future - CBC; Future - Comprehensive metabolic panel; Future  5. Mixed hyperlipidemia - Consider increase in staitn  - Lipid panel; Future - TSH; Future - CBC; Future - Comprehensive metabolic panel; Future  6. BPH associated  with nocturia  - PSA; Future  7. Vitamin D deficiency  - VITAMIN D 25 Hydroxy (Vit-D Deficiency, Fractures); Future  Shirline Frees, NP

## 2023-07-04 NOTE — Telephone Encounter (Signed)
Pharmacy Patient Advocate Encounter   Received notification from CoverMyMeds that prior authorization for Mounjaro 2.5MG /0.5ML auto-injectors is required/requested.   Insurance verification completed.   The patient is insured through [facility] ADVANTAGE/RX ADVANCE .   Per test claim: PA required; PA submitted to above mentioned insurance via CoverMyMeds Key/confirmation #/EOC [id number] Status is pending

## 2023-07-04 NOTE — Patient Instructions (Addendum)
It was great seeing you today   We will follow up with you regarding your lab work   Please let me know if you need anything   I am going to send in [facility] for you, this is in place of Trulicity. Let me know if it is covered.   Please get the shingles vaccination and tetanus booster.

## 2023-07-04 NOTE — Telephone Encounter (Signed)
Left message to return phone call.

## 2023-07-05 LAB — MICROALBUMIN / CREATININE URINE RATIO
Creatinine,U: 132.8 mg/dL
Microalb Creat Ratio: 9.4 mg/g (ref 0.0–30.0)
Microalb, Ur: 1.3 mg/dL (ref 0.0–1.9)

## 2023-07-05 NOTE — Telephone Encounter (Signed)
Patient notified of update  and verbalized understanding.

## 2023-07-06 ENCOUNTER — Other Ambulatory Visit (HOSPITAL_COMMUNITY): Payer: Self-pay

## 2023-07-07 ENCOUNTER — Other Ambulatory Visit: Payer: Self-pay | Admitting: Adult Health

## 2023-07-07 DIAGNOSIS — E1169 Type 2 diabetes mellitus with other specified complication: Secondary | ICD-10-CM

## 2023-07-10 NOTE — Telephone Encounter (Signed)
 The original prescription was discontinued on 07/04/2023 by Shirline Frees, NP. Renewing this prescription may not be appropriate.

## 2023-07-25 ENCOUNTER — Ambulatory Visit

## 2023-07-25 VITALS — Ht 73.0 in | Wt 230.0 lb

## 2023-07-25 DIAGNOSIS — Z0000 Encounter for general adult medical examination without abnormal findings: Secondary | ICD-10-CM

## 2023-07-25 NOTE — Patient Instructions (Signed)
 Jonathan Calhoun , Thank you for taking time to come for your Medicare Wellness Visit. I appreciate your ongoing commitment to your health goals. Please review the following plan we discussed and let me know if I can assist you in the future.   Referrals/Orders/Follow-Ups/Clinician Recommendations:   This is a list of the screening recommended for you and due dates:  Health Maintenance  Topic Date Due   COVID-19 Vaccine (4 - 2024-25 season) 01/21/2023   Zoster (Shingles) Vaccine (2 of 2) 09/12/2023   Hemoglobin A1C  01/01/2024   Eye exam for diabetics  01/25/2024   Yearly kidney function blood test for diabetes  07/03/2024   Yearly kidney health urinalysis for diabetes  07/03/2024   Complete foot exam   07/03/2024   Medicare Annual Wellness Visit  07/24/2024   Colon Cancer Screening  05/19/2026   DTaP/Tdap/Td vaccine (4 - Td or Tdap) 07/17/2033   Pneumonia Vaccine  Completed   Flu Shot  Completed   Hepatitis C Screening  Completed   HPV Vaccine  Aged Out    Advanced directives: (Copy Requested) Please bring a copy of your health care power of attorney and living will to the office to be added to your chart at your convenience.  Next Medicare Annual Wellness Visit scheduled for next year: Yes

## 2023-07-25 NOTE — Progress Notes (Signed)
 Subjective:   Jonathan Calhoun is a 73 y.o. male who presents for Medicare Annual/Subsequent preventive examination.  Visit Complete: Virtual I connected with  Jonathan Calhoun on 07/25/23 by a audio enabled telemedicine application and verified that I am speaking with the correct person using two identifiers.  Patient Location: Home  Provider Location: Home Office  I discussed the limitations of evaluation and management by telemedicine. The patient expressed understanding and agreed to proceed.  Vital Signs: Because this visit was a virtual/telehealth visit, some criteria may be missing or patient reported. Any vitals not documented were not able to be obtained and vitals that have been documented are patient reported.    Cardiac Risk Factors include: advanced age (>19men, >66 women);diabetes mellitus;male gender;hypertension     Objective:    Today's Vitals   07/25/23 1305  Weight: 230 lb (104.3 kg)  Height: 6\' 1"  (1.854 m)   Body mass index is 30.34 kg/m.     07/25/2023    1:13 PM 06/01/2022   11:04 AM 05/31/2021   11:30 AM 05/04/2020   10:41 AM 07/02/2018    1:03 PM 04/03/2018    6:16 AM 04/02/2018    1:26 PM  Advanced Directives  Does Patient Have a Medical Advance Directive? Yes Yes Yes No Yes Yes Yes  Type of Estate agent of [place];Living will Healthcare Power of [place];Living will Healthcare Power of [place];Living will  Healthcare Power of [place];Living will Healthcare Power of [place];Living will Healthcare Power of Attorney  Does patient want to make changes to medical advance directive?   No - Patient declined  No - Patient declined No - Patient declined No - Patient declined  Copy of Healthcare Power of Attorney in Chart? No - copy requested No - copy requested No - copy requested   No - copy requested No - copy requested  Would patient like information on creating a medical advance directive?    No - Patient declined  No - Patient  declined No - Patient declined    Current Medications (verified) Outpatient Encounter Medications as of 07/25/2023  Medication Sig   atorvastatin (LIPITOR) 20 MG tablet TAKE 1 TABLET BY MOUTH EVERY DAY IN THE MORNING   Blood Glucose Monitoring Suppl (ONETOUCH VERIO) w/Device KIT Use to test three times daily.   Blood Glucose Monitoring Suppl DEVI 1 each by Does not apply route in the morning, at noon, and at bedtime. May substitute to any manufacturer covered by patient's insurance.   Cholecalciferol (VITAMIN D3) 50 MCG (2000 UT) capsule TAKE 1 CAPSULE BY MOUTH EVERY DAY IN THE MORNING   citalopram (CELEXA) 10 MG tablet TAKE 1 TABLET BY MOUTH EVERY DAY IN THE MORNING   Glucose Blood (BLOOD GLUCOSE TEST STRIPS) STRP 1 each by In Vitro route in the morning, at noon, and at bedtime. May substitute to any manufacturer covered by patient's insurance.   Lancets Misc. MISC 1 each by Does not apply route in the morning, at noon, and at bedtime. May substitute to any manufacturer covered by patient's insurance.   lisinopril (ZESTRIL) 5 MG tablet TAKE 1 TABLET BY MOUTH EVERY DAY IN THE MORNING   tirzepatide (MOUNJARO) 2.5 MG/0.5ML Pen Inject 2.5 mg into the skin once a week.   No facility-administered encounter medications on file as of 07/25/2023.    Allergies (verified) Patient has no known allergies.   History: Past Medical History:  Diagnosis Date   Allergy    Anxiety    Depression  Diabetes mellitus    ORAL MEDS-NO INSULIN   History of alcoholism (HCC)    History of shingles    Hyperlipidemia    Hypertension    Ileus, postoperative [facility])    Post-op Chole Surgery   Past Surgical History:  Procedure Laterality Date   BONE EXOSTOSIS EXCISION  08/10/2011   Procedure: EXOSTOSIS EXCISION;  Surgeon: Dallas Schimke, DPM;  Location: [facility];  Service: Orthopedics;  Laterality: Left;  Exostectomy of Calcaneous Left Foot   CARDIAC CATHETERIZATION  2002   CHOLECYSTECTOMY     COLON  SURGERY     COLONOSCOPY  12/21/2015   Dr.Danis   ENDOVENOUS ABLATION SAPHENOUS VEIN W/ LASER Left 01/01/2019   endovenous laser ablation left greater saphenous vein by Fabienne Bruns MD    OSTECTOMY Right 04/03/2018   Procedure: OSTECTOMY RIGHT CALCANEUS;  Surgeon: Ferman Hamming, DPM;  Location: [facility];  Service: Podiatry;  Laterality: Right;   RIGHT SHOULDER SURGERY FOR TEAR AND SPUR - YRS AGO     SYNOVECTOMY  08/10/2011   Procedure: SYNOVECTOMY;  Surgeon: Dallas Schimke, DPM;  Location: [facility];  Service: Orthopedics;  Laterality: Left;  Synovectomy of Peroneal Tendon Left Foot   TENDON REPAIR  08/10/2011   Procedure: TENDON REPAIR;  Surgeon: Dallas Schimke, DPM;  Location: [facility];  Service: Orthopedics;  Laterality: Left;  Repair of Peroneal Tendon Left Foot   TENDON REPAIR Right 04/03/2018   Procedure: EXPLORATION OF PERONEAL TENDON WITH POSSIBLE REPAIR OF LONGITUDINAL TEAR OF THE PERONEAL TENDON RIGHT FOOT;  Surgeon: Ferman Hamming, DPM;  Location: [facility];  Service: Podiatry;  Laterality: Right;   TONSILLECTOMY     TOTAL HIP ARTHROPLASTY  03/29/2011   Procedure: TOTAL HIP ARTHROPLASTY;  Surgeon: Gus Rankin Aluisio;  Location: [facility];  Service: Orthopedics;  Laterality: Right;   Family History  Problem Relation Age of Onset   Depression Mother    Cancer Mother        colon   Heart failure Mother    Colon cancer Mother 18   Coronary artery disease Father    Hypertension Father    Social History   Socioeconomic History   Marital status: Married    Spouse name: Not on file   Number of children: Not on file   Years of education: Not on file   Highest education level: Bachelor's degree (e.g., BA, AB, BS)  Occupational History   Not on file  Tobacco Use   Smoking status: Every Day    Types: Cigars, Cigarettes   Smokeless tobacco: Never  Vaping Use   Vaping status: Never Used  Substance and Sexual Activity   Alcohol use: No    Comment: quit 12 yrs ago    Drug use: No   Sexual activity: Not on file  Other Topics Concern   Not on file  Social History Narrative   Married, lives with spouse.   Plans for home after surgery.   Living will, healthcare POA.   Social Drivers of Corporate investment banker Strain: Low Risk  (07/25/2023)   Overall Financial Resource Strain (CARDIA)    Difficulty of Paying Living Expenses: Not hard at all  Food Insecurity: No Food Insecurity (07/25/2023)   Hunger Vital Sign    Worried About Running Out of Food in the Last Year: Never true    Ran Out of Food in the Last Year: Never true  Transportation Needs: No Transportation Needs (07/25/2023)   PRAPARE - Transportation  Lack of Transportation (Medical): No    Lack of Transportation (Non-Medical): No  Physical Activity: Sufficiently Active (07/25/2023)   Exercise Vital Sign    Days of Exercise per Week: 3 days    Minutes of Exercise per Session: 90 min  Stress: No Stress Concern Present (07/25/2023)   [employer] of Occupational Health - Occupational Stress Questionnaire    Feeling of Stress : Not at all  Social Connections: Socially Integrated (07/25/2023)   Social Connection and Isolation Panel [NHANES]    Frequency of Communication with Friends and Family: More than three times a week    Frequency of Social Gatherings with Friends and Family: More than three times a week    Attends Religious Services: More than 4 times per year    Active Member of [employer] or Organizations: Yes    Attends Engineer, structural: More than 4 times per year    Marital Status: Married    Tobacco Counseling Ready to quit: No Counseling given: Yes   Clinical Intake:  Pre-visit preparation completed: Yes  Pain : No/denies pain     BMI - recorded: 30.34 Nutritional Status: BMI > 30  Obese Nutritional Risks: None Diabetes: Yes CBG done?: No Did pt. bring in CBG monitor from home?: No  How often do you need to have someone help you when you read  instructions, pamphlets, or other written materials from your doctor or pharmacy?: 1 - Never  Interpreter Needed?: No  Information entered by :: Theresa Mulligan LPN   Activities of Daily Living    07/25/2023    1:12 PM 07/04/2023    8:02 AM  In your present state of health, do you have any difficulty performing the following activities:  Hearing? 0 0  Vision? 0 0  Difficulty concentrating or making decisions? 0   Walking or climbing stairs? 0 0  Comment  climbing steps due to R hip relplacement  Dressing or bathing? 0 0  Doing errands, shopping? 0 0  Preparing Food and eating ? N   Using the Toilet? N   In the past six months, have you accidently leaked urine? N   Do you have problems with loss of bowel control? N   Managing your Medications? N   Managing your Finances? N   Housekeeping or managing your Housekeeping? N     Patient Care Team: Shirline Frees, NP as PCP - General (Family Medicine) Ortho, [facility] (Orthopedic Surgery) Maryclare Labrador, DDS as Consulting Physician (Dentistry) Verner Chol, [facility] (Inactive) as Pharmacist (Pharmacist)  Indicate any recent Medical Services you may have received from other than [facility] providers in the past year (date may be approximate).     Assessment:   This is a routine wellness examination for [place].  Hearing/Vision screen Hearing Screening - Comments:: Denies hearing difficulties   Vision Screening - Comments:: Wears rx glasses - up to date with routine eye exams with  Dr Dione Booze   Goals Addressed               This Visit's Progress     Remain active (pt-stated)        Lose weight.       Depression Screen    07/25/2023    1:11 PM 07/04/2023    8:02 AM 06/01/2022   11:01 AM 12/14/2021   10:05 AM 05/31/2021   11:20 AM 05/04/2020   10:43 AM 03/17/2020    7:43 AM  PHQ 2/9 Scores  PHQ - 2 Score  0 0 0 1 0 0 1  PHQ- 9 Score 0 1  3  0 1    Fall Risk    07/25/2023    1:12 PM 07/04/2023    8:01 AM 06/01/2022   11:02 AM  11/25/2021    7:57 AM 05/31/2021   11:26 AM  Fall Risk   Falls in the past year? 0 0 0 0 0  Number falls in past yr: 0 0 0  0  Injury with Fall? 0 0 0  0  Risk for fall due to : No Fall Risks No Fall Risks No Fall Risks    Follow up Falls prevention discussed;Falls evaluation completed Falls evaluation completed Falls prevention discussed      MEDICARE RISK AT HOME: Medicare Risk at Home Any stairs in or around the home?: No If so, are there any without handrails?: No Home free of loose throw rugs in walkways, pet beds, electrical cords, etc?: Yes Adequate lighting in your home to reduce risk of falls?: Yes Life alert?: No Use of a cane, walker or w/c?: No Grab bars in the bathroom?: Yes Shower chair or bench in shower?: No Elevated toilet seat or a handicapped toilet?: Yes  TIMED UP AND GO:  Was the test performed?  No    Cognitive Function:        07/25/2023    1:13 PM 06/01/2022   11:04 AM 05/31/2021   11:28 AM  6CIT Screen  What Year? 0 points 0 points 0 points  What month? 0 points 0 points 0 points  What time? 0 points 0 points 0 points  Count back from 20 0 points 0 points 0 points  Months in reverse 0 points 0 points 0 points  Repeat phrase 0 points 0 points 0 points  Total Score 0 points 0 points 0 points    Immunizations Immunization History  Administered Date(s) Administered   Fluad Quad(high Dose 65+) 02/27/2019, 03/17/2020, 03/22/2021, 05/11/2022   Influenza Split 02/14/2011, 01/29/2012   Influenza Whole 05/22/2006   Influenza, High Dose Seasonal PF 05/30/2018, 02/28/2023   Influenza,inj,Quad PF,6+ Mos 02/24/2013, 02/12/2014   Moderna SARS-COV2 Booster Vaccination 01/18/2021   PFIZER(Purple Top)SARS-COV-2 Vaccination 07/06/2019, 07/29/2019   Pneumococcal Conjugate-13 02/12/2014   Pneumococcal Polysaccharide-23 05/22/2000, 05/03/2007, 10/24/2016   Td 05/22/1997   Tdap 01/29/2012, 07/18/2023   Zoster Recombinant(Shingrix) 07/18/2023    TDAP status: Up  to date  Flu Vaccine status: Up to date  Pneumococcal vaccine status: Up to date  Covid-19 vaccine status: Declined, Education has been provided regarding the importance of this vaccine but patient still declined. Advised may receive this vaccine at local pharmacy or Health Dept.or vaccine clinic. Aware to provide a copy of the vaccination record if obtained from local pharmacy or Health Dept. Verbalized acceptance and understanding.  Qualifies for Shingles Vaccine? Yes   Zostavax completed No   Shingrix Completed?: Yes  Screening Tests Health Maintenance  Topic Date Due   COVID-19 Vaccine (4 - 2024-25 season) 01/21/2023   Zoster Vaccines- Shingrix (2 of 2) 09/12/2023   HEMOGLOBIN A1C  01/01/2024   OPHTHALMOLOGY EXAM  01/25/2024   Diabetic kidney evaluation - eGFR measurement  07/03/2024   Diabetic kidney evaluation - Urine ACR  07/03/2024   FOOT EXAM  07/03/2024   Medicare Annual Wellness (AWV)  07/24/2024   Colonoscopy  05/19/2026   DTaP/Tdap/Td (4 - Td or Tdap) 07/17/2033   Pneumonia Vaccine 53+ Years old  Completed   INFLUENZA VACCINE  Completed  Hepatitis C Screening  Completed   HPV VACCINES  Aged Out    Health Maintenance  Health Maintenance Due  Topic Date Due   COVID-19 Vaccine (4 - 2024-25 season) 01/21/2023    Colorectal cancer screening: Type of screening: Colonoscopy. Completed 05/19/21. Repeat every 5 years  Lung Cancer Screening: (Low Dose CT Chest recommended if Age 60-80 years, 20 pack-year currently smoking OR have quit w/in 15years.) does qualify.   Lung Cancer Screening Referral: Deferred  Additional Screening:  Hepatitis C Screening: does qualify; Completed 10/09/17  Vision Screening: Recommended annual ophthalmology exams for early detection of glaucoma and other disorders of the eye. Is the patient up to date with their annual eye exam?  Yes  Who is the provider or what is the name of the office in which the patient attends annual eye exams? Dr  Dione Booze If pt is not established with a provider, would they like to be referred to a provider to establish care? No .   Dental Screening: Recommended annual dental exams for proper oral hygiene  Diabetic Foot Exam: Diabetic Foot Exam: Completed 07/04/23  Community Resource Referral / Chronic Care Management:  CRR required this visit?  No   CCM required this visit?  No     Plan:     I have personally reviewed and noted the following in the patient's chart:   Medical and social history Use of alcohol, tobacco or illicit drugs  Current medications and supplements including opioid prescriptions. Patient is not currently taking opioid prescriptions. Functional ability and status Nutritional status Physical activity Advanced directives List of other physicians Hospitalizations, surgeries, and ER visits in previous 12 months Vitals Screenings to include cognitive, depression, and falls Referrals and appointments  In addition, I have reviewed and discussed with patient certain preventive protocols, quality metrics, and best practice recommendations. A written personalized care plan for preventive services as well as general preventive health recommendations were provided to patient.     Tillie Rung, LPN   11/27/2954   After Visit Summary: (MyChart) Due to this being a telephonic visit, the after visit summary with patients personalized plan was offered to patient via MyChart   Nurse Notes: None

## 2023-07-31 ENCOUNTER — Ambulatory Visit (INDEPENDENT_AMBULATORY_CARE_PROVIDER_SITE_OTHER): Admitting: Adult Health

## 2023-07-31 VITALS — BP 120/60 | HR 70 | Temp 98.3°F | Wt 233.6 lb

## 2023-07-31 DIAGNOSIS — M7631 Iliotibial band syndrome, right leg: Secondary | ICD-10-CM

## 2023-07-31 MED ORDER — NAPROXEN 500 MG PO TABS
500.0000 mg | ORAL_TABLET | Freq: Two times a day (BID) | ORAL | 0 refills | Status: AC
Start: 2023-07-31 — End: ?

## 2023-07-31 NOTE — Progress Notes (Signed)
 Subjective:    Patient ID: Jonathan Calhoun, male    DOB: Dec 27, 1950, 73 y.o.   MRN: [id number]  Leg Pain    73 year old male who  has a past medical history of Allergy, Anxiety, Depression, Diabetes mellitus, History of alcoholism (HCC), History of shingles, Hyperlipidemia, Hypertension, and Ileus, postoperative (HCC).  He presents to the office today for right leg pain that has been present for several months. Pain is mostly on the outside of his right knee. Pain can be sharp at times.  Reports that his leg feels weaker. He will have pain with certain movements such as change in positions, extending his knee, going up stairs and getting in and out of his car.   He has had sciatica in the past but does not feel like this is sciatica. He does not feel like he pulled a muscle.   He has not had any trauma or injury. He will take Ibuprofen occasionally with relief   Review of Systems See HPI   Past Medical History:  Diagnosis Date   Allergy    Anxiety    Depression    Diabetes mellitus    ORAL MEDS-NO INSULIN   History of alcoholism (HCC)    History of shingles    Hyperlipidemia    Hypertension    Ileus, postoperative (HCC)    Post-op Chole Surgery    Social History   Socioeconomic History   Marital status: Married    Spouse name: Not on file   Number of children: Not on file   Years of education: Not on file   Highest education level: Bachelor's degree (e.g., BA, AB, BS)  Occupational History   Not on file  Tobacco Use   Smoking status: Every Day    Types: Cigars, Cigarettes   Smokeless tobacco: Never  Vaping Use   Vaping status: Never Used  Substance and Sexual Activity   Alcohol use: No    Comment: quit 12 yrs ago   Drug use: No   Sexual activity: Not on file  Other Topics Concern   Not on file  Social History Narrative   Married, lives with spouse.   Plans for home after surgery.   Living will, healthcare POA.   Social Drivers of Research scientist (physical sciences) Strain: Low Risk  (07/25/2023)   Overall Financial Resource Strain (CARDIA)    Difficulty of Paying Living Expenses: Not hard at all  Food Insecurity: No Food Insecurity (07/25/2023)   Hunger Vital Sign    Worried About Running Out of Food in the Last Year: Never true    Ran Out of Food in the Last Year: Never true  Transportation Needs: No Transportation Needs (07/25/2023)   PRAPARE - Administrator, Civil Service (Medical): No    Lack of Transportation (Non-Medical): No  Physical Activity: Sufficiently Active (07/25/2023)   Exercise Vital Sign    Days of Exercise per Week: 3 days    Minutes of Exercise per Session: 90 min  Stress: No Stress Concern Present (07/25/2023)   [employer] of Occupational Health - Occupational Stress Questionnaire    Feeling of Stress : Not at all  Social Connections: Socially Integrated (07/25/2023)   Social Connection and Isolation Panel [NHANES]    Frequency of Communication with Friends and Family: More than three times a week    Frequency of Social Gatherings with Friends and Family: More than three times a week    Attends Religious Services:  More than 4 times per year    Active Member of Clubs or Organizations: Yes    Attends Club or Organization Meetings: More than 4 times per year    Marital Status: Married  Catering manager Violence: Not At Risk (07/25/2023)   Humiliation, Afraid, Rape, and Kick questionnaire    Fear of Current or Ex-Partner: No    Emotionally Abused: No    Physically Abused: No    Sexually Abused: No    Past Surgical History:  Procedure Laterality Date   BONE EXOSTOSIS EXCISION  08/10/2011   Procedure: EXOSTOSIS EXCISION;  Surgeon: Dallas Schimke, DPM;  Location: [facility];  Service: Orthopedics;  Laterality: Left;  Exostectomy of Calcaneous Left Foot   CARDIAC CATHETERIZATION  2002   CHOLECYSTECTOMY     COLON SURGERY     COLONOSCOPY  12/21/2015   Dr.Danis   ENDOVENOUS ABLATION SAPHENOUS VEIN W/ LASER  Left 01/01/2019   endovenous laser ablation left greater saphenous vein by Fabienne Bruns MD    OSTECTOMY Right 04/03/2018   Procedure: OSTECTOMY RIGHT CALCANEUS;  Surgeon: Ferman Hamming, DPM;  Location: [facility];  Service: Podiatry;  Laterality: Right;   RIGHT SHOULDER SURGERY FOR TEAR AND SPUR - YRS AGO     SYNOVECTOMY  08/10/2011   Procedure: SYNOVECTOMY;  Surgeon: Dallas Schimke, DPM;  Location: [facility];  Service: Orthopedics;  Laterality: Left;  Synovectomy of Peroneal Tendon Left Foot   TENDON REPAIR  08/10/2011   Procedure: TENDON REPAIR;  Surgeon: Dallas Schimke, DPM;  Location: [facility];  Service: Orthopedics;  Laterality: Left;  Repair of Peroneal Tendon Left Foot   TENDON REPAIR Right 04/03/2018   Procedure: EXPLORATION OF PERONEAL TENDON WITH POSSIBLE REPAIR OF LONGITUDINAL TEAR OF THE PERONEAL TENDON RIGHT FOOT;  Surgeon: Ferman Hamming, DPM;  Location: [facility];  Service: Podiatry;  Laterality: Right;   TONSILLECTOMY     TOTAL HIP ARTHROPLASTY  03/29/2011   Procedure: TOTAL HIP ARTHROPLASTY;  Surgeon: Gus Rankin Aluisio;  Location: [facility];  Service: Orthopedics;  Laterality: Right;    Family History  Problem Relation Age of Onset   Depression Mother    Cancer Mother        colon   Heart failure Mother    Colon cancer Mother 70   Coronary artery disease Father    Hypertension Father     No Known Allergies  Current Outpatient Medications on File Prior to Visit  Medication Sig Dispense Refill   atorvastatin (LIPITOR) 20 MG tablet TAKE 1 TABLET BY MOUTH EVERY DAY IN THE MORNING 90 tablet 1   Blood Glucose Monitoring Suppl (ONETOUCH VERIO) w/Device KIT Use to test three times daily. 1 kit 0   Blood Glucose Monitoring Suppl DEVI 1 each by Does not apply route in the morning, at noon, and at bedtime. May substitute to any manufacturer covered by patient's insurance. 1 each 0   Cholecalciferol (VITAMIN D3) 50 MCG (2000 UT) capsule TAKE 1 CAPSULE BY MOUTH EVERY  DAY IN THE MORNING 30 capsule 2   citalopram (CELEXA) 10 MG tablet TAKE 1 TABLET BY MOUTH EVERY DAY IN THE MORNING 90 tablet 1   Glucose Blood (BLOOD GLUCOSE TEST STRIPS) STRP 1 each by In Vitro route in the morning, at noon, and at bedtime. May substitute to any manufacturer covered by patient's insurance. 300 strip 3   Lancets Misc. MISC 1 each by Does not apply route in the morning, at noon, and at bedtime. May substitute to any  manufacturer covered by [employer]. 300 each 1   lisinopril (ZESTRIL) 5 MG tablet TAKE 1 TABLET BY MOUTH EVERY DAY IN THE MORNING 90 tablet 1   tirzepatide (MOUNJARO) 2.5 MG/0.5ML Pen Inject 2.5 mg into the skin once a week. 2 mL 0   No current facility-administered medications on file prior to visit.    BP 120/60   Pulse 70   Temp 98.3 F (36.8 C) (Oral)   Wt 233 lb 9.6 oz (106 kg)   SpO2 97%   BMI 30.82 kg/m       Objective:   Physical Exam Vitals and nursing note reviewed.  Constitutional:      Appearance: Normal appearance.  Musculoskeletal:        General: Tenderness present.     Right knee: Tenderness present.     Comments: Tenderness throughout right IT band. + Noble sign  Skin:    General: Skin is warm and dry.  Neurological:     General: No focal deficit present.     Mental Status: He is alert and oriented to person, place, and time.  Psychiatric:        Mood and Affect: Mood normal.        Behavior: Behavior normal.        Thought Content: Thought content normal.        Judgment: Judgment normal.        Assessment & Plan:  1. Iliotibial band syndrome of right side (Primary) - Will send in Naprosyn and have him Ice throughout the day. Do gentle stretching exercises - Will also refer to PT  - naproxen (NAPROSYN) 500 MG tablet; Take 1 tablet (500 mg total) by mouth 2 (two) times daily with a meal.  Dispense: 30 tablet; Refill: 0 - Ambulatory referral to Physical Therapy  Shirline Frees, NP  Time spent with patient today  was 31 minutes which consisted of chart review, discussing  IT band syndrome, work up, treatment answering questions and documentation.

## 2023-08-01 ENCOUNTER — Other Ambulatory Visit: Payer: Self-pay | Admitting: Adult Health

## 2023-08-01 DIAGNOSIS — Z7985 Long-term (current) use of injectable non-insulin antidiabetic drugs: Secondary | ICD-10-CM

## 2023-08-01 DIAGNOSIS — E1169 Type 2 diabetes mellitus with other specified complication: Secondary | ICD-10-CM

## 2023-08-01 NOTE — Telephone Encounter (Signed)
 Pt is coming in for this next week. Okay to wait?

## 2023-08-01 NOTE — Telephone Encounter (Signed)
 Noted.

## 2023-08-07 ENCOUNTER — Ambulatory Visit (INDEPENDENT_AMBULATORY_CARE_PROVIDER_SITE_OTHER): Payer: PPO | Admitting: Adult Health

## 2023-08-07 VITALS — BP 130/80 | HR 64 | Temp 98.3°F | Ht 73.0 in | Wt 232.0 lb

## 2023-08-07 DIAGNOSIS — E119 Type 2 diabetes mellitus without complications: Secondary | ICD-10-CM | POA: Diagnosis not present

## 2023-08-07 DIAGNOSIS — I10 Essential (primary) hypertension: Secondary | ICD-10-CM

## 2023-08-07 DIAGNOSIS — Z7985 Long-term (current) use of injectable non-insulin antidiabetic drugs: Secondary | ICD-10-CM | POA: Diagnosis not present

## 2023-08-07 DIAGNOSIS — L0291 Cutaneous abscess, unspecified: Secondary | ICD-10-CM

## 2023-08-07 MED ORDER — DOXYCYCLINE HYCLATE 100 MG PO CAPS: 100.0000 mg | ORAL_CAPSULE | Freq: Two times a day (BID) | ORAL | 0 refills | Status: DC

## 2023-08-07 MED ORDER — TIRZEPATIDE 5 MG/0.5ML ~~LOC~~ SOAJ: 5.0000 mg | SUBCUTANEOUS | 0 refills | Status: DC

## 2023-08-07 NOTE — Progress Notes (Signed)
 Subjective:    Patient ID: STEPHEN BARUCH, male    DOB: 1950-06-12, 73 y.o.   MRN: [id number]  HPI  73 year old male who  has a past medical history of Allergy, Anxiety, Depression, Diabetes mellitus, History of alcoholism (HCC), History of shingles, Hyperlipidemia, Hypertension, and Ileus, postoperative (HCC).  He presents to the office today for follow up regarding DM and HTN    DM - was started on Mounjaro 2.5 mg weekly a month ago.   He denies hypoglycemic events and that has been monitoring his blood sugars at home with readings consistently in the low 100s. Since switching from Trulicity he has had less cravings for sweet foods.  Lab Results  Component Value Date   HGBA1C 6.6 (H) 07/04/2023   HGBA1C 6.2 05/11/2022   HGBA1C 5.9 12/14/2021   HTN -continue lisinopril 5 mg daily.  He denies dizziness, lightheadedness, chest pain, or shortness of breath BP Readings from Last 3 Encounters:  08/07/23 130/80  07/31/23 120/60  07/04/23 110/70   Skin Infection - he reports that he has a " bump" on his left hip/buttock that has been present for an unknown amount of time. It started out as a pea size and then has grown in size. Slightly tender with palpation. No drainage.   Review of Systems See HPI   Past Medical History:  Diagnosis Date   Allergy    Anxiety    Depression    Diabetes mellitus    ORAL MEDS-NO INSULIN   History of alcoholism (HCC)    History of shingles    Hyperlipidemia    Hypertension    Ileus, postoperative (HCC)    Post-op Chole Surgery    Social History   Socioeconomic History   Marital status: Married    Spouse name: Not on file   Number of children: Not on file   Years of education: Not on file   Highest education level: Bachelor's degree (e.g., BA, AB, BS)  Occupational History   Not on file  Tobacco Use   Smoking status: Every Day    Types: Cigars, Cigarettes   Smokeless tobacco: Never  Vaping Use   Vaping status: Never Used  Substance  and Sexual Activity   Alcohol use: No    Comment: quit 12 yrs ago   Drug use: No   Sexual activity: Not on file  Other Topics Concern   Not on file  Social History Narrative   Married, lives with spouse.   Plans for home after surgery.   Living will, healthcare POA.   Social Drivers of Corporate investment banker Strain: Low Risk  (07/25/2023)   Overall Financial Resource Strain (CARDIA)    Difficulty of Paying Living Expenses: Not hard at all  Food Insecurity: No Food Insecurity (07/25/2023)   Hunger Vital Sign    Worried About Running Out of Food in the Last Year: Never true    Ran Out of Food in the Last Year: Never true  Transportation Needs: No Transportation Needs (07/25/2023)   PRAPARE - Administrator, Civil Service (Medical): No    Lack of Transportation (Non-Medical): No  Physical Activity: Sufficiently Active (07/25/2023)   Exercise Vital Sign    Days of Exercise per Week: 3 days    Minutes of Exercise per Session: 90 min  Stress: No Stress Concern Present (07/25/2023)   [employer] of Occupational Health - Occupational Stress Questionnaire    Feeling of Stress : Not at  all  Social Connections: Socially Integrated (07/25/2023)   Social Connection and Isolation Panel [NHANES]    Frequency of Communication with Friends and Family: More than three times a week    Frequency of Social Gatherings with Friends and Family: More than three times a week    Attends Religious Services: More than 4 times per year    Active Member of Clubs or Organizations: Yes    Attends Banker Meetings: More than 4 times per year    Marital Status: Married  Catering manager Violence: Not At Risk (07/25/2023)   Humiliation, Afraid, Rape, and Kick questionnaire    Fear of Current or Ex-Partner: No    Emotionally Abused: No    Physically Abused: No    Sexually Abused: No    Past Surgical History:  Procedure Laterality Date   BONE EXOSTOSIS EXCISION  08/10/2011    Procedure: EXOSTOSIS EXCISION;  Surgeon: Dallas Schimke, DPM;  Location: [facility];  Service: Orthopedics;  Laterality: Left;  Exostectomy of Calcaneous Left Foot   CARDIAC CATHETERIZATION  2002   CHOLECYSTECTOMY     COLON SURGERY     COLONOSCOPY  12/21/2015   Dr.Danis   ENDOVENOUS ABLATION SAPHENOUS VEIN W/ LASER Left 01/01/2019   endovenous laser ablation left greater saphenous vein by Fabienne Bruns MD    OSTECTOMY Right 04/03/2018   Procedure: OSTECTOMY RIGHT CALCANEUS;  Surgeon: Ferman Hamming, DPM;  Location: [facility];  Service: Podiatry;  Laterality: Right;   RIGHT SHOULDER SURGERY FOR TEAR AND SPUR - YRS AGO     SYNOVECTOMY  08/10/2011   Procedure: SYNOVECTOMY;  Surgeon: Dallas Schimke, DPM;  Location: [facility];  Service: Orthopedics;  Laterality: Left;  Synovectomy of Peroneal Tendon Left Foot   TENDON REPAIR  08/10/2011   Procedure: TENDON REPAIR;  Surgeon: Dallas Schimke, DPM;  Location: [facility];  Service: Orthopedics;  Laterality: Left;  Repair of Peroneal Tendon Left Foot   TENDON REPAIR Right 04/03/2018   Procedure: EXPLORATION OF PERONEAL TENDON WITH POSSIBLE REPAIR OF LONGITUDINAL TEAR OF THE PERONEAL TENDON RIGHT FOOT;  Surgeon: Ferman Hamming, DPM;  Location: [facility];  Service: Podiatry;  Laterality: Right;   TONSILLECTOMY     TOTAL HIP ARTHROPLASTY  03/29/2011   Procedure: TOTAL HIP ARTHROPLASTY;  Surgeon: Gus Rankin Aluisio;  Location: [facility];  Service: Orthopedics;  Laterality: Right;    Family History  Problem Relation Age of Onset   Depression Mother    Cancer Mother        colon   Heart failure Mother    Colon cancer Mother 36   Coronary artery disease Father    Hypertension Father     No Known Allergies  Current Outpatient Medications on File Prior to Visit  Medication Sig Dispense Refill   atorvastatin (LIPITOR) 20 MG tablet TAKE 1 TABLET BY MOUTH EVERY DAY IN THE MORNING 90 tablet 1   Blood Glucose Monitoring Suppl (ONETOUCH VERIO)  w/Device KIT Use to test three times daily. 1 kit 0   Blood Glucose Monitoring Suppl DEVI 1 each by Does not apply route in the morning, at noon, and at bedtime. May substitute to any manufacturer covered by patient's insurance. 1 each 0   Cholecalciferol (VITAMIN D3) 50 MCG (2000 UT) capsule TAKE 1 CAPSULE BY MOUTH EVERY DAY IN THE MORNING 30 capsule 2   citalopram (CELEXA) 10 MG tablet TAKE 1 TABLET BY MOUTH EVERY DAY IN THE MORNING 90 tablet 1   lisinopril (ZESTRIL) 5  MG tablet TAKE 1 TABLET BY MOUTH EVERY DAY IN THE MORNING 90 tablet 1   naproxen (NAPROSYN) 500 MG tablet Take 1 tablet (500 mg total) by mouth 2 (two) times daily with a meal. 30 tablet 0   No current facility-administered medications on file prior to visit.    BP 130/80   Pulse 64   Temp 98.3 F (36.8 C) (Oral)   Ht 6\' 1"  (1.854 m)   Wt 232 lb (105.2 kg)   SpO2 97%   BMI 30.61 kg/m       Objective:   Physical Exam Vitals and nursing note reviewed.  Constitutional:      Appearance: Normal appearance. He is obese.  Cardiovascular:     Rate and Rhythm: Normal rate and regular rhythm.     Pulses: Normal pulses.     Heart sounds: Normal heart sounds.  Pulmonary:     Effort: Pulmonary effort is normal.     Breath sounds: Normal breath sounds.  Skin:    General: Skin is warm and dry.       Neurological:     General: No focal deficit present.     Mental Status: He is alert and oriented to person, place, and time.  Psychiatric:        Mood and Affect: Mood normal.        Behavior: Behavior normal.        Thought Content: Thought content normal.        Judgment: Judgment normal.       Assessment & Plan:  1. Long-term current use of injectable noninsulin antidiabetic medication (Primary) - Will increase to 5 mg weekly.  - Follow up in 1 month  - tirzepatide [facility]) 5 MG/0.5ML Pen; Inject 5 mg into the skin once a week.  Dispense: 2 mL; Refill: 0  2. Essential hypertension - Controlled. No change in  medication   3. Abscess  - doxycycline (VIBRAMYCIN) 100 MG capsule; Take 1 capsule (100 mg total) by mouth 2 (two) times daily.  Dispense: 14 capsule; Refill: 0  Shirline Frees, NP

## 2023-08-28 ENCOUNTER — Encounter: Payer: Self-pay | Admitting: Adult Health

## 2023-08-28 ENCOUNTER — Ambulatory Visit (INDEPENDENT_AMBULATORY_CARE_PROVIDER_SITE_OTHER): Admitting: Adult Health

## 2023-08-28 VITALS — BP 130/88 | HR 81 | Temp 98.3°F | Ht 73.0 in | Wt 228.0 lb

## 2023-08-28 DIAGNOSIS — E1169 Type 2 diabetes mellitus with other specified complication: Secondary | ICD-10-CM | POA: Diagnosis not present

## 2023-08-28 DIAGNOSIS — L02416 Cutaneous abscess of left lower limb: Secondary | ICD-10-CM | POA: Diagnosis not present

## 2023-08-28 DIAGNOSIS — Z7985 Long-term (current) use of injectable non-insulin antidiabetic drugs: Secondary | ICD-10-CM

## 2023-08-28 MED ORDER — TIRZEPATIDE 7.5 MG/0.5ML ~~LOC~~ SOAJ: 7.5000 mg | SUBCUTANEOUS | 0 refills | Status: DC

## 2023-08-28 MED ORDER — CEPHALEXIN 500 MG PO CAPS
500.0000 mg | ORAL_CAPSULE | Freq: Two times a day (BID) | ORAL | 0 refills | Status: AC
Start: 2023-08-28 — End: 2023-09-07

## 2023-08-28 NOTE — Progress Notes (Signed)
 Subjective:    Patient ID: Jonathan Calhoun, male    DOB: November 11, 1950, 73 y.o.   MRN: [id number]  HPI  He presents to the office today for concern of an abscess on his left hip.  Was seen about 3 weeks ago for this issue at which time it was noted that he had a nonfluctuant abscess on his left hip, he was 5-day course of doxycycline which he took and finished.  3 days ago he noticed some moisture at this area.  His wife put pressure on it was able to get about 60 mL of purulent drainage from the abscess.  Reports that he continues to have drainage and the area is sore.  Furthermore he has finished his second month of Mounjaro that he is taking for management of diabetes mellitus. He reports that he is doing well with his medication.  He has been exercising and eating healthy and has been able to lose about 5 pounds.  He would like to increase his dose  Lab Results  Component Value Date   HGBA1C 6.6 (H) 07/04/2023   Wt Readings from Last 3 Encounters:  08/28/23 228 lb (103.4 kg)  08/07/23 232 lb (105.2 kg)  07/31/23 233 lb 9.6 oz (106 kg)     Review of Systems See HPI   Past Medical History:  Diagnosis Date   Allergy    Anxiety    Depression    Diabetes mellitus    ORAL MEDS-NO INSULIN   History of alcoholism (HCC)    History of shingles    Hyperlipidemia    Hypertension    Ileus, postoperative (HCC)    Post-op Chole Surgery    Social History   Socioeconomic History   Marital status: Married    Spouse name: Not on file   Number of children: Not on file   Years of education: Not on file   Highest education level: Bachelor's degree (e.g., BA, AB, BS)  Occupational History   Not on file  Tobacco Use   Smoking status: Every Day    Types: Cigars, Cigarettes   Smokeless tobacco: Never  Vaping Use   Vaping status: Never Used  Substance and Sexual Activity   Alcohol use: No    Comment: quit 12 yrs ago   Drug use: No   Sexual activity: Not on file  Other Topics  Concern   Not on file  Social History Narrative   Married, lives with spouse.   Plans for home after surgery.   Living will, healthcare POA.   Social Drivers of Corporate investment banker Strain: Low Risk  (07/25/2023)   Overall Financial Resource Strain (CARDIA)    Difficulty of Paying Living Expenses: Not hard at all  Food Insecurity: No Food Insecurity (07/25/2023)   Hunger Vital Sign    Worried About Running Out of Food in the Last Year: Never true    Ran Out of Food in the Last Year: Never true  Transportation Needs: No Transportation Needs (07/25/2023)   PRAPARE - Administrator, Civil Service (Medical): No    Lack of Transportation (Non-Medical): No  Physical Activity: Sufficiently Active (07/25/2023)   Exercise Vital Sign    Days of Exercise per Week: 3 days    Minutes of Exercise per Session: 90 min  Stress: No Stress Concern Present (07/25/2023)   [employer] of Occupational Health - Occupational Stress Questionnaire    Feeling of Stress : Not at all  Social Connections:  Socially Integrated (07/25/2023)   Social Connection and Isolation Panel [NHANES]    Frequency of Communication with Friends and Family: More than three times a week    Frequency of Social Gatherings with Friends and Family: More than three times a week    Attends Religious Services: More than 4 times per year    Active Member of Clubs or Organizations: Yes    Attends Banker Meetings: More than 4 times per year    Marital Status: Married  Catering manager Violence: Not At Risk (07/25/2023)   Humiliation, Afraid, Rape, and Kick questionnaire    Fear of Current or Ex-Partner: No    Emotionally Abused: No    Physically Abused: No    Sexually Abused: No    Past Surgical History:  Procedure Laterality Date   BONE EXOSTOSIS EXCISION  08/10/2011   Procedure: EXOSTOSIS EXCISION;  Surgeon: Dallas Schimke, DPM;  Location: [facility];  Service: Orthopedics;  Laterality: Left;   Exostectomy of Calcaneous Left Foot   CARDIAC CATHETERIZATION  2002   CHOLECYSTECTOMY     COLON SURGERY     COLONOSCOPY  12/21/2015   Dr.Danis   ENDOVENOUS ABLATION SAPHENOUS VEIN W/ LASER Left 01/01/2019   endovenous laser ablation left greater saphenous vein by Fabienne Bruns MD    OSTECTOMY Right 04/03/2018   Procedure: OSTECTOMY RIGHT CALCANEUS;  Surgeon: Ferman Hamming, DPM;  Location: [facility];  Service: Podiatry;  Laterality: Right;   RIGHT SHOULDER SURGERY FOR TEAR AND SPUR - YRS AGO     SYNOVECTOMY  08/10/2011   Procedure: SYNOVECTOMY;  Surgeon: Dallas Schimke, DPM;  Location: [facility];  Service: Orthopedics;  Laterality: Left;  Synovectomy of Peroneal Tendon Left Foot   TENDON REPAIR  08/10/2011   Procedure: TENDON REPAIR;  Surgeon: Dallas Schimke, DPM;  Location: [facility];  Service: Orthopedics;  Laterality: Left;  Repair of Peroneal Tendon Left Foot   TENDON REPAIR Right 04/03/2018   Procedure: EXPLORATION OF PERONEAL TENDON WITH POSSIBLE REPAIR OF LONGITUDINAL TEAR OF THE PERONEAL TENDON RIGHT FOOT;  Surgeon: Ferman Hamming, DPM;  Location: [facility];  Service: Podiatry;  Laterality: Right;   TONSILLECTOMY     TOTAL HIP ARTHROPLASTY  03/29/2011   Procedure: TOTAL HIP ARTHROPLASTY;  Surgeon: Gus Rankin Aluisio;  Location: [facility];  Service: Orthopedics;  Laterality: Right;    Family History  Problem Relation Age of Onset   Depression Mother    Cancer Mother        colon   Heart failure Mother    Colon cancer Mother 64   Coronary artery disease Father    Hypertension Father     No Known Allergies  Current Outpatient Medications on File Prior to Visit  Medication Sig Dispense Refill   atorvastatin (LIPITOR) 20 MG tablet TAKE 1 TABLET BY MOUTH EVERY DAY IN THE MORNING 90 tablet 1   Blood Glucose Monitoring Suppl (ONETOUCH VERIO) w/Device KIT Use to test three times daily. 1 kit 0   Blood Glucose Monitoring Suppl DEVI 1 each by Does not apply route in the  morning, at noon, and at bedtime. May substitute to any manufacturer covered by patient's insurance. 1 each 0   Cholecalciferol (VITAMIN D3) 50 MCG (2000 UT) capsule TAKE 1 CAPSULE BY MOUTH EVERY DAY IN THE MORNING 30 capsule 2   citalopram (CELEXA) 10 MG tablet TAKE 1 TABLET BY MOUTH EVERY DAY IN THE MORNING 90 tablet 1   doxycycline (VIBRAMYCIN) 100 MG capsule Take 1  capsule (100 mg total) by mouth 2 (two) times daily. 14 capsule 0   lisinopril (ZESTRIL) 5 MG tablet TAKE 1 TABLET BY MOUTH EVERY DAY IN THE MORNING 90 tablet 1   naproxen (NAPROSYN) 500 MG tablet Take 1 tablet (500 mg total) by mouth 2 (two) times daily with a meal. 30 tablet 0   tirzepatide (MOUNJARO) 5 MG/0.5ML Pen Inject 5 mg into the skin once a week. 2 mL 0   No current facility-administered medications on file prior to visit.    BP 130/88   Pulse 81   Temp 98.3 F (36.8 C) (Oral)   Ht 6\' 1"  (1.854 m)   Wt 228 lb (103.4 kg)   SpO2 98%   BMI 30.08 kg/m       Objective:   Physical Exam Vitals and nursing note reviewed.  Constitutional:      Appearance: Normal appearance.  Cardiovascular:     Rate and Rhythm: Normal rate and regular rhythm.     Pulses: Normal pulses.     Heart sounds: Normal heart sounds.  Pulmonary:     Effort: Pulmonary effort is normal.     Breath sounds: Normal breath sounds.  Musculoskeletal:        General: Normal range of motion.  Skin:    General: Skin is warm and dry.     Findings: Abscess present.       Neurological:     General: No focal deficit present.     Mental Status: He is alert and oriented to person, place, and time.  Psychiatric:        Mood and Affect: Mood normal.        Behavior: Behavior normal.        Thought Content: Thought content normal.        Judgment: Judgment normal.        Assessment & Plan:  1. Abscess of left hip (Primary) Procedure:  Incision and drainage of abscess Risks, benefits, and alternatives explained and consent obtained. Time  out conducted. Surface cleaned with alcohol. 1 cc lidocaine with epinephine infiltrated around abscess. Adequate anesthesia ensured. Area prepped and draped in a sterile fashion. #11 blade used to make a stab incision into abscess. Pus expressed with pressure. Curved hemostat used to explore 4 quadrants and loculations broken up. Further purulence expressed.  Approximately 3 inches of iodoform packing placed leaving a 1-inch tail. Hemostasis achieved. Pt stable. Aftercare and follow-up advised. - Follow up in 2-3 days for wound check.   - cephALEXin (KEFLEX) 500 MG capsule; Take 1 capsule (500 mg total) by mouth 2 (two) times daily for 10 days.  Dispense: 20 capsule; Refill: 0  2. Type 2 diabetes mellitus with other specified complication, without long-term current use of insulin (HCC)  - tirzepatide (MOUNJARO) 7.5 MG/0.5ML Pen; Inject 7.5 mg into the skin once a week.  Dispense: 2 mL; Refill: 0   3. Long-term current use of injectable noninsulin antidiabetic medication  - tirzepatide (MOUNJARO) 7.5 MG/0.5ML Pen; Inject 7.5 mg into the skin once a week.  Dispense: 2 mL; Refill: 0

## 2023-08-28 NOTE — Patient Instructions (Signed)
 It was great seeing you today   I have sent in your mounjaro.   Please follow up in 2-3 days for wound check

## 2023-08-30 ENCOUNTER — Encounter: Payer: Self-pay | Admitting: Adult Health

## 2023-08-30 ENCOUNTER — Ambulatory Visit: Admitting: Adult Health

## 2023-08-30 VITALS — BP 122/70 | HR 69 | Temp 98.0°F | Ht 73.0 in | Wt 228.0 lb

## 2023-08-30 DIAGNOSIS — Z5189 Encounter for other specified aftercare: Secondary | ICD-10-CM

## 2023-08-30 NOTE — Progress Notes (Signed)
 Subjective:    Patient ID: Jonathan Calhoun, male    DOB: [birth year], 73 y.o.   MRN: [id number]  HPI 73 year old male who  has a past medical history of Allergy, Anxiety, Depression, Diabetes mellitus, History of alcoholism (HCC), History of shingles, Hyperlipidemia, Hypertension, and Ileus, postoperative (HCC).  He presents to the office today for follow-up regarding an abscess of his left hip.  An incision and drainage done 2 days ago with packing placed.  He was placed on Keflex for 10 days.  Today he reports that he is healing well. Has had scant drainage coming from wound    Review of Systems See HPI   Past Medical History:  Diagnosis Date   Allergy    Anxiety    Depression    Diabetes mellitus    ORAL MEDS-NO INSULIN   History of alcoholism (HCC)    History of shingles    Hyperlipidemia    Hypertension    Ileus, postoperative (HCC)    Post-op Chole Surgery    Social History   Socioeconomic History   Marital status: Married    Spouse name: Not on file   Number of children: Not on file   Years of education: Not on file   Highest education level: Bachelor's degree (e.g., BA, AB, BS)  Occupational History   Not on file  Tobacco Use   Smoking status: Every Day    Types: Cigars, Cigarettes   Smokeless tobacco: Never  Vaping Use   Vaping status: Never Used  Substance and Sexual Activity   Alcohol use: No    Comment: quit 12 yrs ago   Drug use: No   Sexual activity: Not on file  Other Topics Concern   Not on file  Social History Narrative   Married, lives with spouse.   Plans for home after surgery.   Living will, healthcare POA.   Social Drivers of Corporate investment banker Strain: Low Risk  (07/25/2023)   Overall Financial Resource Strain (CARDIA)    Difficulty of Paying Living Expenses: Not hard at all  Food Insecurity: No Food Insecurity (07/25/2023)   Hunger Vital Sign    Worried About Running Out of Food in the Last Year: Never true    Ran Out of  Food in the Last Year: Never true  Transportation Needs: No Transportation Needs (07/25/2023)   PRAPARE - Administrator, Civil Service (Medical): No    Lack of Transportation (Non-Medical): No  Physical Activity: Sufficiently Active (07/25/2023)   Exercise Vital Sign    Days of Exercise per Week: 3 days    Minutes of Exercise per Session: 90 min  Stress: No Stress Concern Present (07/25/2023)   [employer] of Occupational Health - Occupational Stress Questionnaire    Feeling of Stress : Not at all  Social Connections: Socially Integrated (07/25/2023)   Social Connection and Isolation Panel [NHANES]    Frequency of Communication with Friends and Family: More than three times a week    Frequency of Social Gatherings with Friends and Family: More than three times a week    Attends Religious Services: More than 4 times per year    Active Member of [employer] or Organizations: Yes    Attends Banker Meetings: More than 4 times per year    Marital Status: Married  Catering manager Violence: Not At Risk (07/25/2023)   Humiliation, Afraid, Rape, and Kick questionnaire    Fear of Current or Ex-Partner:  No    Emotionally Abused: No    Physically Abused: No    Sexually Abused: No    Past Surgical History:  Procedure Laterality Date   BONE EXOSTOSIS EXCISION  08/10/2011   Procedure: EXOSTOSIS EXCISION;  Surgeon: Dallas Schimke, DPM;  Location: [facility];  Service: Orthopedics;  Laterality: Left;  Exostectomy of Calcaneous Left Foot   CARDIAC CATHETERIZATION  2002   CHOLECYSTECTOMY     COLON SURGERY     COLONOSCOPY  12/21/2015   Dr.Danis   ENDOVENOUS ABLATION SAPHENOUS VEIN W/ LASER Left 01/01/2019   endovenous laser ablation left greater saphenous vein by Fabienne Bruns MD    OSTECTOMY Right 04/03/2018   Procedure: OSTECTOMY RIGHT CALCANEUS;  Surgeon: Ferman Hamming, DPM;  Location: [facility];  Service: Podiatry;  Laterality: Right;   RIGHT SHOULDER SURGERY FOR TEAR  AND SPUR - YRS AGO     SYNOVECTOMY  08/10/2011   Procedure: SYNOVECTOMY;  Surgeon: Dallas Schimke, DPM;  Location: [facility];  Service: Orthopedics;  Laterality: Left;  Synovectomy of Peroneal Tendon Left Foot   TENDON REPAIR  08/10/2011   Procedure: TENDON REPAIR;  Surgeon: Dallas Schimke, DPM;  Location: [facility];  Service: Orthopedics;  Laterality: Left;  Repair of Peroneal Tendon Left Foot   TENDON REPAIR Right 04/03/2018   Procedure: EXPLORATION OF PERONEAL TENDON WITH POSSIBLE REPAIR OF LONGITUDINAL TEAR OF THE PERONEAL TENDON RIGHT FOOT;  Surgeon: Ferman Hamming, DPM;  Location: [facility];  Service: Podiatry;  Laterality: Right;   TONSILLECTOMY     TOTAL HIP ARTHROPLASTY  03/29/2011   Procedure: TOTAL HIP ARTHROPLASTY;  Surgeon: Gus Rankin Aluisio;  Location: [facility];  Service: Orthopedics;  Laterality: Right;    Family History  Problem Relation Age of Onset   Depression Mother    Cancer Mother        colon   Heart failure Mother    Colon cancer Mother 23   Coronary artery disease Father    Hypertension Father     No Known Allergies  Current Outpatient Medications on File Prior to Visit  Medication Sig Dispense Refill   atorvastatin (LIPITOR) 20 MG tablet TAKE 1 TABLET BY MOUTH EVERY DAY IN THE MORNING 90 tablet 1   Blood Glucose Monitoring Suppl (ONETOUCH VERIO) w/Device KIT Use to test three times daily. 1 kit 0   Blood Glucose Monitoring Suppl DEVI 1 each by Does not apply route in the morning, at noon, and at bedtime. May substitute to any manufacturer covered by patient's insurance. 1 each 0   cephALEXin (KEFLEX) 500 MG capsule Take 1 capsule (500 mg total) by mouth 2 (two) times daily for 10 days. 20 capsule 0   Cholecalciferol (VITAMIN D3) 50 MCG (2000 UT) capsule TAKE 1 CAPSULE BY MOUTH EVERY DAY IN THE MORNING 30 capsule 2   citalopram (CELEXA) 10 MG tablet TAKE 1 TABLET BY MOUTH EVERY DAY IN THE MORNING 90 tablet 1   doxycycline (VIBRAMYCIN) 100 MG capsule  Take 1 capsule (100 mg total) by mouth 2 (two) times daily. 14 capsule 0   lisinopril (ZESTRIL) 5 MG tablet TAKE 1 TABLET BY MOUTH EVERY DAY IN THE MORNING 90 tablet 1   naproxen (NAPROSYN) 500 MG tablet Take 1 tablet (500 mg total) by mouth 2 (two) times daily with a meal. 30 tablet 0   tirzepatide (MOUNJARO) 7.5 MG/0.5ML Pen Inject 7.5 mg into the skin once a week. 2 mL 0   No current facility-administered medications on file  prior to visit.    BP 122/70   Pulse 69   Temp 98 F (36.7 C) (Oral)   Ht 6\' 1"  (1.854 m)   Wt 228 lb (103.4 kg)   SpO2 96%   BMI 30.08 kg/m       Objective:   Physical Exam Vitals and nursing note reviewed.  Constitutional:      Appearance: Normal appearance.  Musculoskeletal:        General: Normal range of motion.  Skin:    General: Skin is warm and dry.     Findings: Wound present.     Comments: Scant purulent drainage noted on bandage. Appears to be healing well.   Neurological:     General: No focal deficit present.     Mental Status: He is alert and oriented to person, place, and time.  Psychiatric:        Mood and Affect: Mood normal.        Behavior: Behavior normal.        Thought Content: Thought content normal.        Judgment: Judgment normal.       Assessment & Plan:  1. Visit for wound care (Primary) - Packing removed. No signs of continued infection. Continue with abx until finished.  - Follow up as needed  Shirline Frees, NP

## 2023-09-13 ENCOUNTER — Other Ambulatory Visit: Payer: Self-pay | Admitting: Adult Health

## 2023-09-13 DIAGNOSIS — F419 Anxiety disorder, unspecified: Secondary | ICD-10-CM

## 2023-09-19 ENCOUNTER — Other Ambulatory Visit: Payer: Self-pay | Admitting: Adult Health

## 2023-09-19 NOTE — Telephone Encounter (Signed)
 Okay for refill?

## 2023-09-22 ENCOUNTER — Other Ambulatory Visit: Payer: Self-pay | Admitting: Adult Health

## 2023-09-22 DIAGNOSIS — Z7985 Long-term (current) use of injectable non-insulin antidiabetic drugs: Secondary | ICD-10-CM

## 2023-09-22 DIAGNOSIS — E1169 Type 2 diabetes mellitus with other specified complication: Secondary | ICD-10-CM

## 2023-09-25 ENCOUNTER — Encounter: Payer: Self-pay | Admitting: Adult Health

## 2023-09-25 ENCOUNTER — Ambulatory Visit (INDEPENDENT_AMBULATORY_CARE_PROVIDER_SITE_OTHER): Admitting: Adult Health

## 2023-09-25 VITALS — BP 120/70 | HR 68 | Temp 97.8°F | Ht 73.0 in | Wt 221.0 lb

## 2023-09-25 DIAGNOSIS — I10 Essential (primary) hypertension: Secondary | ICD-10-CM | POA: Diagnosis not present

## 2023-09-25 DIAGNOSIS — E1169 Type 2 diabetes mellitus with other specified complication: Secondary | ICD-10-CM | POA: Diagnosis not present

## 2023-09-25 DIAGNOSIS — Z7985 Long-term (current) use of injectable non-insulin antidiabetic drugs: Secondary | ICD-10-CM

## 2023-09-25 LAB — POCT GLYCOSYLATED HEMOGLOBIN (HGB A1C): Hemoglobin A1C: 5.8 % — AB (ref 4.0–5.6)

## 2023-09-25 MED ORDER — TIRZEPATIDE 10 MG/0.5ML ~~LOC~~ SOAJ: 10.0000 mg | SUBCUTANEOUS | 0 refills | Status: DC

## 2023-09-25 NOTE — Telephone Encounter (Signed)
  The original prescription was discontinued on 09/25/2023 by Alto Atta, NP. Renewing this prescription may not be appropriate.

## 2023-09-25 NOTE — Progress Notes (Signed)
 Subjective:    Patient ID: Jonathan Calhoun, male    DOB: 07-28-1950, 73 y.o.   MRN: [id number]  HPI 73 year old male who  has a past medical history of Allergy, Anxiety, Depression, Diabetes mellitus, History of alcoholism (HCC), History of shingles, Hyperlipidemia, Hypertension, and Ileus, postoperative (HCC).   He is He presents to the office today for follow up regarding DM and HTN    DM - was started on Mounjaro 7.5 mg weekly. He denies side effects.  He denies hypoglycemic events and that has been monitoring his blood sugars at home with readings consistently in the low 100s. He is going to gym multiple times a week and is eating healthy Lab Results  Component Value Date   HGBA1C 6.6 (H) 07/04/2023   HGBA1C 6.2 05/11/2022   HGBA1C 5.9 12/14/2021   Wt Readings from Last 10 Encounters:  09/25/23 221 lb (100.2 kg)  08/30/23 228 lb (103.4 kg)  08/28/23 228 lb (103.4 kg)  08/07/23 232 lb (105.2 kg)  07/31/23 233 lb 9.6 oz (106 kg)  07/25/23 230 lb (104.3 kg)  07/04/23 234 lb (106.1 kg)  06/01/22 218 lb (98.9 kg)  05/11/22 218 lb (98.9 kg)  12/14/21 211 lb (95.7 kg)   HTN -continue lisinopril  5 mg daily.  He denies dizziness, lightheadedness, chest pain, or shortness of breath BP Readings from Last 3 Encounters:  09/25/23 120/70  08/30/23 122/70  08/28/23 130/88     Review of Systems See HPI   Past Medical History:  Diagnosis Date   Allergy    Anxiety    Depression    Diabetes mellitus    ORAL MEDS-NO INSULIN    History of alcoholism (HCC)    History of shingles    Hyperlipidemia    Hypertension    Ileus, postoperative (HCC)    Post-op Chole Surgery    Social History   Socioeconomic History   Marital status: Married    Spouse name: Not on file   Number of children: Not on file   Years of education: Not on file   Highest education level: Bachelor's degree (e.g., BA, AB, BS)  Occupational History   Not on file  Tobacco Use   Smoking status: Every Day     Types: Cigars, Cigarettes   Smokeless tobacco: Never  Vaping Use   Vaping status: Never Used  Substance and Sexual Activity   Alcohol use: No    Comment: quit 12 yrs ago   Drug use: No   Sexual activity: Not on file  Other Topics Concern   Not on file  Social History Narrative   Married, lives with spouse.   Plans for home after surgery.   Living will, healthcare POA.   Social Drivers of Corporate investment banker Strain: Low Risk  (07/25/2023)   Overall Financial Resource Strain (CARDIA)    Difficulty of Paying Living Expenses: Not hard at all  Food Insecurity: No Food Insecurity (07/25/2023)   Hunger Vital Sign    Worried About Running Out of Food in the Last Year: Never true    Ran Out of Food in the Last Year: Never true  Transportation Needs: No Transportation Needs (07/25/2023)   PRAPARE - Administrator, Civil Service (Medical): No    Lack of Transportation (Non-Medical): No  Physical Activity: Sufficiently Active (07/25/2023)   Exercise Vital Sign    Days of Exercise per Week: 3 days    Minutes of Exercise per Session: 90  min  Stress: No Stress Concern Present (07/25/2023)   [employer] of Occupational Health - Occupational Stress Questionnaire    Feeling of Stress : Not at all  Social Connections: Socially Integrated (07/25/2023)   Social Connection and Isolation Panel [NHANES]    Frequency of Communication with Friends and Family: More than three times a week    Frequency of Social Gatherings with Friends and Family: More than three times a week    Attends Religious Services: More than 4 times per year    Active Member of Clubs or Organizations: Yes    Attends Banker Meetings: More than 4 times per year    Marital Status: Married  Catering manager Violence: Not At Risk (07/25/2023)   Humiliation, Afraid, Rape, and Kick questionnaire    Fear of Current or Ex-Partner: No    Emotionally Abused: No    Physically Abused: No    Sexually Abused:  No    Past Surgical History:  Procedure Laterality Date   BONE EXOSTOSIS EXCISION  08/10/2011   Procedure: EXOSTOSIS EXCISION;  Surgeon: Dewayne Ford, DPM;  Location: [facility];  Service: Orthopedics;  Laterality: Left;  Exostectomy of Calcaneous Left Foot   CARDIAC CATHETERIZATION  2002   CHOLECYSTECTOMY     COLON SURGERY     COLONOSCOPY  12/21/2015   Dr.Danis   ENDOVENOUS ABLATION SAPHENOUS VEIN W/ LASER Left 01/01/2019   endovenous laser ablation left greater saphenous vein by Stacia Dynes MD    OSTECTOMY Right 04/03/2018   Procedure: OSTECTOMY RIGHT CALCANEUS;  Surgeon: Iverson Market, DPM;  Location: [facility];  Service: Podiatry;  Laterality: Right;   RIGHT SHOULDER SURGERY FOR TEAR AND SPUR - YRS AGO     SYNOVECTOMY  08/10/2011   Procedure: SYNOVECTOMY;  Surgeon: Dewayne Ford, DPM;  Location: [facility];  Service: Orthopedics;  Laterality: Left;  Synovectomy of Peroneal Tendon Left Foot   TENDON REPAIR  08/10/2011   Procedure: TENDON REPAIR;  Surgeon: Dewayne Ford, DPM;  Location: [facility];  Service: Orthopedics;  Laterality: Left;  Repair of Peroneal Tendon Left Foot   TENDON REPAIR Right 04/03/2018   Procedure: EXPLORATION OF PERONEAL TENDON WITH POSSIBLE REPAIR OF LONGITUDINAL TEAR OF THE PERONEAL TENDON RIGHT FOOT;  Surgeon: Iverson Market, DPM;  Location: [facility];  Service: Podiatry;  Laterality: Right;   TONSILLECTOMY     TOTAL HIP ARTHROPLASTY  03/29/2011   Procedure: TOTAL HIP ARTHROPLASTY;  Surgeon: Henri Loft Aluisio;  Location: [facility];  Service: Orthopedics;  Laterality: Right;    Family History  Problem Relation Age of Onset   Depression Mother    Cancer Mother        colon   Heart failure Mother    Colon cancer Mother 9   Coronary artery disease Father    Hypertension Father     No Known Allergies  Current Outpatient Medications on File Prior to Visit  Medication Sig Dispense Refill   atorvastatin  (LIPITOR) 20 MG tablet TAKE 1  TABLET BY MOUTH EVERY DAY IN THE MORNING 90 tablet 1   Blood Glucose Monitoring Suppl (ONETOUCH VERIO) w/Device KIT Use to test three times daily. 1 kit 0   Blood Glucose Monitoring Suppl DEVI 1 each by Does not apply route in the morning, at noon, and at bedtime. May substitute to any manufacturer covered by patient's insurance. 1 each 0   Cholecalciferol (VITAMIN D3) 50 MCG (2000 UT) capsule TAKE 1 CAPSULE BY MOUTH EVERY DAY IN THE MORNING  30 capsule 2   citalopram  (CELEXA ) 10 MG tablet TAKE 1 TABLET BY MOUTH EVERY DAY IN THE MORNING 90 tablet 1   doxycycline  (VIBRAMYCIN ) 100 MG capsule Take 1 capsule (100 mg total) by mouth 2 (two) times daily. 14 capsule 0   lisinopril  (ZESTRIL ) 5 MG tablet TAKE 1 TABLET BY MOUTH EVERY DAY IN THE MORNING 90 tablet 1   naproxen  (NAPROSYN ) 500 MG tablet Take 1 tablet (500 mg total) by mouth 2 (two) times daily with a meal. 30 tablet 0   tirzepatide (MOUNJARO) 7.5 MG/0.5ML Pen Inject 7.5 mg into the skin once a week. 2 mL 0   No current facility-administered medications on file prior to visit.    BP 120/70   Pulse 68   Temp 97.8 F (36.6 C) (Oral)   Ht 6\' 1"  (1.854 m)   Wt 221 lb (100.2 kg)   SpO2 96%   BMI 29.16 kg/m       Objective:   Physical Exam Vitals and nursing note reviewed.  Constitutional:      Appearance: Normal appearance. He is obese.  Cardiovascular:     Rate and Rhythm: Normal rate and regular rhythm.     Pulses: Normal pulses.     Heart sounds: Normal heart sounds.  Pulmonary:     Effort: Pulmonary effort is normal.     Breath sounds: Normal breath sounds.  Skin:    General: Skin is warm and dry.  Neurological:     General: No focal deficit present.     Mental Status: He is alert and oriented to person, place, and time.  Psychiatric:        Mood and Affect: Mood normal.        Behavior: Behavior normal.        Thought Content: Thought content normal.        Judgment: Judgment normal.       Assessment & Plan:  1.  Type 2 diabetes mellitus with other specified complication, without long-term current use of insulin  (HCC) (Primary)  - POC HgB A1c- 5.8  - Will increase Mounjaro to 10 mg weekly  - Follow up in 3 months  - tirzepatide (MOUNJARO) 10 MG/0.5ML Pen; Inject 10 mg into the skin once a week.  Dispense: 6 mL; Refill: 0  2. Long-term current use of injectable noninsulin antidiabetic medication  - tirzepatide (MOUNJARO) 10 MG/0.5ML Pen; Inject 10 mg into the skin once a week.  Dispense: 6 mL; Refill: 0  3. Essential hypertension - well controlled. No change in medication   Alto Atta, NP

## 2023-12-14 ENCOUNTER — Other Ambulatory Visit: Payer: Self-pay | Admitting: Adult Health

## 2023-12-14 DIAGNOSIS — I10 Essential (primary) hypertension: Secondary | ICD-10-CM

## 2023-12-14 DIAGNOSIS — Z760 Encounter for issue of repeat prescription: Secondary | ICD-10-CM

## 2023-12-20 ENCOUNTER — Ambulatory Visit: Admitting: Adult Health

## 2023-12-20 ENCOUNTER — Encounter: Payer: Self-pay | Admitting: Adult Health

## 2023-12-20 VITALS — BP 120/80 | HR 74 | Temp 98.1°F | Wt 216.0 lb

## 2023-12-20 DIAGNOSIS — E1169 Type 2 diabetes mellitus with other specified complication: Secondary | ICD-10-CM

## 2023-12-20 DIAGNOSIS — I10 Essential (primary) hypertension: Secondary | ICD-10-CM

## 2023-12-20 DIAGNOSIS — Z7985 Long-term (current) use of injectable non-insulin antidiabetic drugs: Secondary | ICD-10-CM | POA: Diagnosis not present

## 2023-12-20 LAB — POCT GLYCOSYLATED HEMOGLOBIN (HGB A1C): Hemoglobin A1C: 5.9 % — AB (ref 4.0–5.6)

## 2023-12-20 MED ORDER — TIRZEPATIDE 10 MG/0.5ML ~~LOC~~ SOAJ: 10.0000 mg | SUBCUTANEOUS | 1 refills | Status: DC

## 2023-12-20 NOTE — Patient Instructions (Signed)
 Health Maintenance Due  Topic Date Due   COVID-19 Vaccine (4 - 2024-25 season) 01/21/2023   Zoster Vaccines- Shingrix (2 of 2) 09/12/2023       07/25/2023    1:11 PM 07/04/2023    8:02 AM 06/01/2022   11:01 AM  Depression screen PHQ 2/9  Decreased Interest 0 0 0  Down, Depressed, Hopeless 0 0 0  PHQ - 2 Score 0 0 0  Altered sleeping 0 0   Tired, decreased energy 0 0   Change in appetite 0 1   Feeling bad or failure about yourself  0 0   Trouble concentrating 0 0   Moving slowly or fidgety/restless 0 0   Suicidal thoughts 0 0   PHQ-9 Score 0 1   Difficult doing work/chores Not difficult at all Not difficult at all

## 2023-12-20 NOTE — Progress Notes (Signed)
 Subjective:    Patient ID: Jonathan Calhoun, male    DOB: May 29, 1950, 73 y.o.   MRN: [id number]  HPI 73 year old male who  has a past medical history of Allergy, Anxiety, Depression, Diabetes mellitus, History of alcoholism (HCC), History of shingles, Hyperlipidemia, Hypertension, and Ileus, postoperative (HCC).  He is He presents to the office today for follow up regarding DM and HTN    DM - was started on Mounjaro  10mg  weekly. He denies side effects.  He denies hypoglycemic events and that has been monitoring his blood sugars at home with readings consistently in the low 100s. He is going to gym multiple times a week and is eating healthy Lab Results  Component Value Date   HGBA1C 5.8 (A) 09/25/2023   HGBA1C 6.6 (H) 07/04/2023   HGBA1C 6.2 05/11/2022   Wt Readings from Last 10 Encounters:  12/20/23 216 lb (98 kg)  09/25/23 221 lb (100.2 kg)  08/30/23 228 lb (103.4 kg)  08/28/23 228 lb (103.4 kg)  08/07/23 232 lb (105.2 kg)  07/31/23 233 lb 9.6 oz (106 kg)  07/25/23 230 lb (104.3 kg)  07/04/23 234 lb (106.1 kg)  06/01/22 218 lb (98.9 kg)  05/11/22 218 lb (98.9 kg)   HTN -continue lisinopril  5 mg daily.  He denies dizziness, lightheadedness, chest pain, or shortness of breath BP Readings from Last 3 Encounters:  12/20/23 120/80  09/25/23 120/70  08/30/23 122/70    Review of Systems See HPI   Past Medical History:  Diagnosis Date   Allergy    Anxiety    Depression    Diabetes mellitus    ORAL MEDS-NO INSULIN    History of alcoholism (HCC)    History of shingles    Hyperlipidemia    Hypertension    Ileus, postoperative (HCC)    Post-op Chole Surgery    Social History   Socioeconomic History   Marital status: Married    Spouse name: Not on file   Number of children: Not on file   Years of education: Not on file   Highest education level: Bachelor's degree (e.g., BA, AB, BS)  Occupational History   Not on file  Tobacco Use   Smoking status: Every Day     Types: Cigars, Cigarettes   Smokeless tobacco: Never  Vaping Use   Vaping status: Never Used  Substance and Sexual Activity   Alcohol use: No    Comment: quit 12 yrs ago   Drug use: No   Sexual activity: Not on file  Other Topics Concern   Not on file  Social History Narrative   Married, lives with spouse.   Plans for home after surgery.   Living will, healthcare POA.   Social Drivers of Corporate investment banker Strain: Low Risk  (07/25/2023)   Overall Financial Resource Strain (CARDIA)    Difficulty of Paying Living Expenses: Not hard at all  Food Insecurity: No Food Insecurity (07/25/2023)   Hunger Vital Sign    Worried About Running Out of Food in the Last Year: Never true    Ran Out of Food in the Last Year: Never true  Transportation Needs: No Transportation Needs (07/25/2023)   PRAPARE - Administrator, Civil Service (Medical): No    Lack of Transportation (Non-Medical): No  Physical Activity: Sufficiently Active (07/25/2023)   Exercise Vital Sign    Days of Exercise per Week: 3 days    Minutes of Exercise per Session: 90 min  Stress: No Stress Concern Present (07/25/2023)   [employer] of Occupational Health - Occupational Stress Questionnaire    Feeling of Stress : Not at all  Social Connections: Socially Integrated (07/25/2023)   Social Connection and Isolation Panel    Frequency of Communication with Friends and Family: More than three times a week    Frequency of Social Gatherings with Friends and Family: More than three times a week    Attends Religious Services: More than 4 times per year    Active Member of Clubs or Organizations: Yes    Attends Banker Meetings: More than 4 times per year    Marital Status: Married  Catering manager Violence: Not At Risk (07/25/2023)   Humiliation, Afraid, Rape, and Kick questionnaire    Fear of Current or Ex-Partner: No    Emotionally Abused: No    Physically Abused: No    Sexually Abused: No     Past Surgical History:  Procedure Laterality Date   BONE EXOSTOSIS EXCISION  08/10/2011   Procedure: EXOSTOSIS EXCISION;  Surgeon: Morene Donley Anon, DPM;  Location: [facility];  Service: Orthopedics;  Laterality: Left;  Exostectomy of Calcaneous Left Foot   CARDIAC CATHETERIZATION  2002   CHOLECYSTECTOMY     COLON SURGERY     COLONOSCOPY  12/21/2015   Dr.Danis   ENDOVENOUS ABLATION SAPHENOUS VEIN W/ LASER Left 01/01/2019   endovenous laser ablation left greater saphenous vein by Carlin Haddock MD    OSTECTOMY Right 04/03/2018   Procedure: OSTECTOMY RIGHT CALCANEUS;  Surgeon: Anon Morene, DPM;  Location: [facility];  Service: Podiatry;  Laterality: Right;   RIGHT SHOULDER SURGERY FOR TEAR AND SPUR - YRS AGO     SYNOVECTOMY  08/10/2011   Procedure: SYNOVECTOMY;  Surgeon: Morene Donley Anon, DPM;  Location: [facility];  Service: Orthopedics;  Laterality: Left;  Synovectomy of Peroneal Tendon Left Foot   TENDON REPAIR  08/10/2011   Procedure: TENDON REPAIR;  Surgeon: Morene Donley Anon, DPM;  Location: [facility];  Service: Orthopedics;  Laterality: Left;  Repair of Peroneal Tendon Left Foot   TENDON REPAIR Right 04/03/2018   Procedure: EXPLORATION OF PERONEAL TENDON WITH POSSIBLE REPAIR OF LONGITUDINAL TEAR OF THE PERONEAL TENDON RIGHT FOOT;  Surgeon: Anon Morene, DPM;  Location: [facility];  Service: Podiatry;  Laterality: Right;   TONSILLECTOMY     TOTAL HIP ARTHROPLASTY  03/29/2011   Procedure: TOTAL HIP ARTHROPLASTY;  Surgeon: Dempsey GAILS Aluisio;  Location: [facility];  Service: Orthopedics;  Laterality: Right;    Family History  Problem Relation Age of Onset   Depression Mother    Cancer Mother        colon   Heart failure Mother    Colon cancer Mother 87   Coronary artery disease Father    Hypertension Father     No Known Allergies  Current Outpatient Medications on File Prior to Visit  Medication Sig Dispense Refill   atorvastatin  (LIPITOR) 20 MG tablet TAKE 1  TABLET BY MOUTH EVERY DAY IN THE MORNING 90 tablet 1   Blood Glucose Monitoring Suppl (ONETOUCH VERIO) w/Device KIT Use to test three times daily. 1 kit 0   Blood Glucose Monitoring Suppl DEVI 1 each by Does not apply route in the morning, at noon, and at bedtime. May substitute to any manufacturer covered by patient's insurance. 1 each 0   Cholecalciferol (VITAMIN D3) 50 MCG (2000 UT) capsule TAKE 1 CAPSULE BY MOUTH EVERY DAY IN THE MORNING 30 capsule 2  citalopram  (CELEXA ) 10 MG tablet TAKE 1 TABLET BY MOUTH EVERY DAY IN THE MORNING 90 tablet 1   Lancets (ONETOUCH DELICA PLUS LANCET30G) MISC 3 (three) times daily.     lisinopril  (ZESTRIL ) 5 MG tablet TAKE 1 TABLET BY MOUTH EVERY DAY IN THE MORNING 90 tablet 1   naproxen  (NAPROSYN ) 500 MG tablet Take 1 tablet (500 mg total) by mouth 2 (two) times daily with a meal. 30 tablet 0   ONETOUCH VERIO test strip 2 (two) times daily.     tirzepatide  (MOUNJARO ) 10 MG/0.5ML Pen Inject 10 mg into the skin once a week. 6 mL 0   No current facility-administered medications on file prior to visit.    BP 120/80   Pulse 74   Temp 98.1 F (36.7 C) (Oral)   Wt 216 lb (98 kg)   SpO2 96%   BMI 28.50 kg/m       Objective:   Physical Exam Vitals and nursing note reviewed.  Constitutional:      Appearance: Normal appearance. He is obese.  Cardiovascular:     Rate and Rhythm: Normal rate and regular rhythm.     Pulses: Normal pulses.     Heart sounds: Normal heart sounds.  Pulmonary:     Effort: Pulmonary effort is normal.     Breath sounds: Normal breath sounds.  Skin:    General: Skin is warm and dry.  Neurological:     General: No focal deficit present.     Mental Status: He is alert and oriented to person, place, and time.  Psychiatric:        Mood and Affect: Mood normal.        Behavior: Behavior normal.        Thought Content: Thought content normal.        Judgment: Judgment normal.        Assessment & Plan:  1. Type 2 diabetes  mellitus with other specified complication, without long-term current use of insulin  (HCC) (Primary)  - POC HgB A1c- 5.9  - Will keep him on Mounajro 10 mg weekly x 6 months . Follow up as CPE - tirzepatide  (MOUNJARO ) 10 MG/0.5ML Pen; Inject 10 mg into the skin once a week.  Dispense: 6 mL; Refill: 1  2. Long-term current use of injectable noninsulin antidiabetic medication  - tirzepatide  (MOUNJARO ) 10 MG/0.5ML Pen; Inject 10 mg into the skin once a week.  Dispense: 6 mL; Refill: 1  3. Essential hypertension - Well controlled. No change in medication    Darleene Shape, NP

## 2023-12-27 ENCOUNTER — Other Ambulatory Visit: Payer: Self-pay | Admitting: Adult Health

## 2024-02-07 DIAGNOSIS — Z961 Presence of intraocular lens: Secondary | ICD-10-CM | POA: Diagnosis not present

## 2024-02-07 DIAGNOSIS — E119 Type 2 diabetes mellitus without complications: Secondary | ICD-10-CM | POA: Diagnosis not present

## 2024-02-07 LAB — HM DIABETES EYE EXAM

## 2024-03-30 ENCOUNTER — Other Ambulatory Visit: Payer: Self-pay | Admitting: Adult Health

## 2024-03-30 DIAGNOSIS — F419 Anxiety disorder, unspecified: Secondary | ICD-10-CM

## 2024-04-02 ENCOUNTER — Other Ambulatory Visit: Payer: Self-pay | Admitting: Adult Health

## 2024-04-15 ENCOUNTER — Other Ambulatory Visit: Payer: Self-pay | Admitting: Adult Health

## 2024-04-15 MED ORDER — VITAMIN D3 50 MCG (2000 UT) PO CAPS: 2000.0000 [IU] | ORAL_CAPSULE | Freq: Every day | ORAL | 3 refills | Status: AC

## 2024-05-24 ENCOUNTER — Other Ambulatory Visit: Payer: Self-pay | Admitting: Adult Health

## 2024-05-24 DIAGNOSIS — E1169 Type 2 diabetes mellitus with other specified complication: Secondary | ICD-10-CM

## 2024-05-24 DIAGNOSIS — Z7985 Long-term (current) use of injectable non-insulin antidiabetic drugs: Secondary | ICD-10-CM

## 2024-06-14 ENCOUNTER — Other Ambulatory Visit: Payer: Self-pay | Admitting: Adult Health

## 2024-06-14 DIAGNOSIS — I10 Essential (primary) hypertension: Secondary | ICD-10-CM

## 2024-06-14 DIAGNOSIS — Z760 Encounter for issue of repeat prescription: Secondary | ICD-10-CM

## 2024-07-08 ENCOUNTER — Encounter: Admitting: Adult Health

## 2024-07-30 ENCOUNTER — Ambulatory Visit
